# Patient Record
Sex: Male | Born: 1945 | Race: White | Hispanic: No | Smoking: Former smoker
Health system: Southern US, Community
[De-identification: ages and names within clinical notes are randomized; demographics above are authoritative.]

## PROBLEM LIST (undated history)

## (undated) DIAGNOSIS — K219 Gastro-esophageal reflux disease without esophagitis: Secondary | ICD-10-CM

## (undated) DIAGNOSIS — I4892 Unspecified atrial flutter: Secondary | ICD-10-CM

## (undated) DIAGNOSIS — I10 Essential (primary) hypertension: Secondary | ICD-10-CM

## (undated) DIAGNOSIS — J449 Chronic obstructive pulmonary disease, unspecified: Secondary | ICD-10-CM

## (undated) HISTORY — PX: ARTERY REPAIR: SHX5117

---

## 2002-05-20 ENCOUNTER — Emergency Department (HOSPITAL_COMMUNITY): Admission: EM | Admit: 2002-05-20 | Discharge: 2002-05-20 | Payer: Self-pay | Admitting: Emergency Medicine

## 2004-05-21 ENCOUNTER — Emergency Department (HOSPITAL_COMMUNITY): Admission: EM | Admit: 2004-05-21 | Discharge: 2004-05-21 | Payer: Self-pay | Admitting: *Deleted

## 2005-01-20 ENCOUNTER — Emergency Department (HOSPITAL_COMMUNITY): Admission: EM | Admit: 2005-01-20 | Discharge: 2005-01-20 | Payer: Self-pay | Admitting: Emergency Medicine

## 2005-03-29 ENCOUNTER — Emergency Department (HOSPITAL_COMMUNITY): Admission: EM | Admit: 2005-03-29 | Discharge: 2005-03-29 | Payer: Self-pay | Admitting: Emergency Medicine

## 2009-08-23 ENCOUNTER — Inpatient Hospital Stay (HOSPITAL_COMMUNITY): Admission: EM | Admit: 2009-08-23 | Discharge: 2009-08-23 | Payer: Self-pay | Admitting: Emergency Medicine

## 2010-01-01 ENCOUNTER — Emergency Department (HOSPITAL_COMMUNITY): Admission: EM | Admit: 2010-01-01 | Discharge: 2010-01-01 | Payer: Self-pay | Admitting: Emergency Medicine

## 2010-04-28 ENCOUNTER — Emergency Department (HOSPITAL_COMMUNITY): Admission: EM | Admit: 2010-04-28 | Discharge: 2010-04-28 | Payer: Self-pay | Admitting: Emergency Medicine

## 2010-05-27 ENCOUNTER — Emergency Department (HOSPITAL_COMMUNITY): Admission: EM | Admit: 2010-05-27 | Discharge: 2010-05-27 | Payer: Self-pay | Admitting: Emergency Medicine

## 2010-05-28 ENCOUNTER — Emergency Department (HOSPITAL_COMMUNITY): Admission: EM | Admit: 2010-05-28 | Discharge: 2010-05-28 | Payer: Self-pay | Admitting: Emergency Medicine

## 2011-01-09 LAB — URINALYSIS, ROUTINE W REFLEX MICROSCOPIC
Glucose, UA: 100 mg/dL — AB
Ketones, ur: NEGATIVE mg/dL
Specific Gravity, Urine: 1.02 (ref 1.005–1.030)
pH: 7.5 (ref 5.0–8.0)

## 2011-01-09 LAB — URINE MICROSCOPIC-ADD ON

## 2011-01-11 LAB — BASIC METABOLIC PANEL
CO2: 29 mEq/L (ref 19–32)
Glucose, Bld: 117 mg/dL — ABNORMAL HIGH (ref 70–99)
Sodium: 137 mEq/L (ref 135–145)

## 2011-01-11 LAB — CBC
HCT: 40.4 % (ref 39.0–52.0)
Hemoglobin: 13.7 g/dL (ref 13.0–17.0)
MCHC: 33.8 g/dL (ref 30.0–36.0)
WBC: 7.7 10*3/uL (ref 4.0–10.5)

## 2011-01-11 LAB — DIFFERENTIAL
Basophils Absolute: 0.1 10*3/uL (ref 0.0–0.1)
Eosinophils Relative: 4 % (ref 0–5)
Lymphs Abs: 3.1 10*3/uL (ref 0.7–4.0)
Monocytes Absolute: 0.7 10*3/uL (ref 0.1–1.0)
Monocytes Relative: 9 % (ref 3–12)

## 2011-01-18 LAB — DIFFERENTIAL
Basophils Relative: 1 % (ref 0–1)
Lymphs Abs: 3.4 10*3/uL (ref 0.7–4.0)
Monocytes Absolute: 0.6 10*3/uL (ref 0.1–1.0)
Monocytes Relative: 7 % (ref 3–12)
Neutro Abs: 3.4 10*3/uL (ref 1.7–7.7)
Neutrophils Relative %: 45 % (ref 43–77)

## 2011-01-18 LAB — BASIC METABOLIC PANEL
CO2: 30 mEq/L (ref 19–32)
GFR calc Af Amer: >60 mL/min (ref >60)
Potassium: 3.8 mEq/L (ref 3.5–5.1)

## 2011-01-18 LAB — CBC
MCV: 92.6 fL (ref 78.0–100.0)
Platelets: 269 10*3/uL (ref 150–400)
RDW: 12.6 % (ref 11.5–15.5)

## 2011-01-29 ENCOUNTER — Emergency Department (HOSPITAL_COMMUNITY)
Admission: EM | Admit: 2011-01-29 | Discharge: 2011-01-29 | Disposition: A | Discharge status: Home / Self Care | Payer: Self-pay | Attending: Emergency Medicine | Admitting: Emergency Medicine

## 2011-01-29 ENCOUNTER — Emergency Department (HOSPITAL_COMMUNITY): Payer: Self-pay

## 2011-01-29 DIAGNOSIS — J449 Chronic obstructive pulmonary disease, unspecified: Secondary | ICD-10-CM | POA: Insufficient documentation

## 2011-01-29 DIAGNOSIS — R071 Chest pain on breathing: Secondary | ICD-10-CM | POA: Insufficient documentation

## 2011-01-29 DIAGNOSIS — F411 Generalized anxiety disorder: Secondary | ICD-10-CM | POA: Insufficient documentation

## 2011-01-29 DIAGNOSIS — J4489 Other specified chronic obstructive pulmonary disease: Secondary | ICD-10-CM | POA: Insufficient documentation

## 2011-01-29 LAB — CBC
HCT: 39.6 % (ref 39.0–52.0)
Hemoglobin: 13.6 g/dL (ref 13.0–17.0)
MCHC: 34.3 g/dL (ref 30.0–36.0)
MCV: 95.8 fL (ref 78.0–100.0)
Platelets: 208 10*3/uL (ref 150–400)
Platelets: 211 10*3/uL (ref 150–400)
RBC: 4.44 MIL/uL (ref 4.22–5.81)
RDW: 12.7 % (ref 11.5–15.5)
RDW: 13 % (ref 11.5–15.5)

## 2011-01-29 LAB — DIFFERENTIAL
Basophils Absolute: 0 10*3/uL (ref 0.0–0.1)
Basophils Absolute: 0 K/uL (ref 0.0–0.1)
Basophils Relative: 0 % (ref 0–1)
Basophils Relative: 1 % (ref 0–1)
Eosinophils Absolute: 0 K/uL (ref 0.0–0.7)
Eosinophils Relative: 0 % (ref 0–5)
Lymphocytes Relative: 9 % — ABNORMAL LOW (ref 12–46)
Lymphs Abs: 0.6 K/uL — ABNORMAL LOW (ref 0.7–4.0)
Monocytes Absolute: 0.2 K/uL (ref 0.1–1.0)
Monocytes Relative: 3 % (ref 3–12)
Neutro Abs: 5.6 10*3/uL (ref 1.7–7.7)
Neutro Abs: 6 K/uL (ref 1.7–7.7)
Neutrophils Relative %: 76 % (ref 43–77)
Neutrophils Relative %: 88 % — ABNORMAL HIGH (ref 43–77)

## 2011-01-29 LAB — BASIC METABOLIC PANEL
BUN: 13 mg/dL (ref 6–23)
CO2: 29 mEq/L (ref 19–32)
Calcium: 8.9 mg/dL (ref 8.4–10.5)
Creatinine, Ser: 1 mg/dL (ref 0.4–1.5)
GFR calc Af Amer: >60 mL/min (ref >60)
GFR calc non Af Amer: >60 mL/min (ref >60)
GFR calc non Af Amer: >60 mL/min (ref >60)
Glucose, Bld: 126 mg/dL — ABNORMAL HIGH (ref 70–99)
Sodium: 139 mEq/L (ref 135–145)

## 2011-01-29 LAB — EXPECTORATED SPUTUM ASSESSMENT W GRAM STAIN, RFLX TO RESP C

## 2011-01-29 LAB — BLOOD GAS, ARTERIAL
Acid-Base Excess: 5 mmol/L — ABNORMAL HIGH (ref 0.0–2.0)
FIO2: 21 %
O2 Content: 21 L/min
pCO2 arterial: 48.8 mmHg — ABNORMAL HIGH (ref 35.0–45.0)
pH, Arterial: 7.4 (ref 7.350–7.450)

## 2011-01-29 LAB — CULTURE, RESPIRATORY W GRAM STAIN

## 2011-01-29 LAB — LIPID PANEL
HDL: 46 mg/dL (ref >39)
Total CHOL/HDL Ratio: 4 RATIO
Triglycerides: 51 mg/dL (ref <150)
VLDL: 10 mg/dL (ref 0–40)

## 2011-07-14 ENCOUNTER — Encounter: Payer: Self-pay | Admitting: *Deleted

## 2011-07-14 ENCOUNTER — Emergency Department (HOSPITAL_COMMUNITY)
Admission: EM | Admit: 2011-07-14 | Discharge: 2011-07-14 | Disposition: A | Discharge status: Home / Self Care | Payer: Self-pay | Attending: Emergency Medicine | Admitting: Emergency Medicine

## 2011-07-14 ENCOUNTER — Emergency Department (HOSPITAL_COMMUNITY): Payer: Self-pay

## 2011-07-14 DIAGNOSIS — K5900 Constipation, unspecified: Secondary | ICD-10-CM | POA: Insufficient documentation

## 2011-07-14 DIAGNOSIS — J449 Chronic obstructive pulmonary disease, unspecified: Secondary | ICD-10-CM | POA: Insufficient documentation

## 2011-07-14 DIAGNOSIS — J4489 Other specified chronic obstructive pulmonary disease: Secondary | ICD-10-CM | POA: Insufficient documentation

## 2011-07-14 DIAGNOSIS — R109 Unspecified abdominal pain: Secondary | ICD-10-CM

## 2011-07-14 DIAGNOSIS — Z87891 Personal history of nicotine dependence: Secondary | ICD-10-CM | POA: Insufficient documentation

## 2011-07-14 HISTORY — DX: Chronic obstructive pulmonary disease, unspecified: J44.9

## 2011-07-14 LAB — URINALYSIS, ROUTINE W REFLEX MICROSCOPIC
Ketones, ur: NEGATIVE mg/dL
Leukocytes, UA: NEGATIVE
Nitrite: NEGATIVE
Protein, ur: NEGATIVE mg/dL
Urobilinogen, UA: 0.2 mg/dL (ref 0.0–1.0)

## 2011-07-14 LAB — URINE MICROSCOPIC-ADD ON

## 2011-07-14 MED ORDER — BISACODYL 5 MG PO TBEC: 5.0000 mg | DELAYED_RELEASE_TABLET | Freq: Two times a day (BID) | ORAL | Status: AC

## 2011-07-14 MED ORDER — MAGNESIUM CITRATE PO SOLN: 296.0000 mL | Freq: Once | ORAL | Status: AC

## 2011-07-14 NOTE — ED Provider Notes (Addendum)
History     CSN: [id number] Arrival date & time: 07/14/2011  7:27 PM   Chief Complaint  Patient presents with  . Abdominal Pain     (Include location/radiation/quality/duration/timing/severity/associated sxs/prior treatment) Patient is a 65 y.o. male presenting with abdominal pain. The history is provided by the patient.  Abdominal Pain The primary symptoms of the illness include abdominal pain. The primary symptoms of the illness do not include fever, shortness of breath, nausea, vomiting, diarrhea or dysuria.  Additional symptoms associated with the illness include constipation. Symptoms associated with the illness do not include diaphoresis, hematuria or back pain.   The patient is a 65 year old male with a history of COPD.  He was lying in bed.  He had sudden onset of left-sided abdominal pain that extended to the right side.  It lasted for about 15 minutes.  It and is resolved now.  He denies nausea, vomiting, fevers, chills, cough, shortness of breath, lightheadedness, diarrhea, or hematuria.  He has no history of similar symptoms.  He has had constipation over the last few days.  He denies alcohol use.  He denies prior abdominal surgery.    Past Medical History  Diagnosis Date  . COPD (chronic obstructive pulmonary disease)      History reviewed. No pertinent past surgical history.  No family history on file.  History  Substance Use Topics  . Smoking status: Former Research scientist (life sciences)  . Smokeless tobacco: Never Used  . Alcohol Use: No      Review of Systems  Constitutional: Negative for fever and diaphoresis.  HENT: Negative for neck pain.   Eyes: Negative for redness.  Respiratory: Negative for cough, chest tightness and shortness of breath.   Cardiovascular: Negative for chest pain and palpitations.  Gastrointestinal: Positive for abdominal pain and constipation. Negative for nausea, vomiting and diarrhea.  Genitourinary: Negative for dysuria and hematuria.    Musculoskeletal: Negative for back pain.  Neurological: Negative for light-headedness and headaches.  Psychiatric/Behavioral: Negative for confusion.    Allergies  Penicillins  Home Medications  No current outpatient prescriptions on file.  Physical Exam    BP 136/93  Pulse 86  Temp(Src) 97.9 F (36.6 C) (Oral)  Resp 24  Ht 5\' 11"  (1.803 m)  Wt 235 lb (106.595 kg)  BMI 32.78 kg/m2  SpO2 95%  Physical Exam  Constitutional: He is oriented to person, place, and time. He appears well-developed and well-nourished. No distress.       Obese  HENT:  Head: Normocephalic and atraumatic.  Eyes: EOM are normal. Pupils are equal, round, and reactive to light.  Neck: Normal range of motion. Neck supple.  Cardiovascular: Normal rate, regular rhythm and normal heart sounds.   No murmur heard. Pulmonary/Chest: Effort normal and breath sounds normal. No respiratory distress. He has no wheezes. He has no rales.  Abdominal: Soft. Bowel sounds are normal. He exhibits no distension and no mass. There is no tenderness. There is no rebound and no guarding.  Musculoskeletal: Normal range of motion. He exhibits no edema and no tenderness.  Neurological: He is alert and oriented to person, place, and time. No cranial nerve deficit.  Skin: Skin is warm and dry. He is not diaphoretic.  Psychiatric: He has a normal mood and affect. His behavior is normal.    ED Course  Procedures  We will perform a urinalysis, and abdominal x-ray to evaluate for hematuria or urinary tract infection in this elderly male, with transient abdominal pain, and a history of constipation. No  results found.   No diagnosis found.   MDM Abdominal pain       Elmer Picker, MD 07/14/11 2105  Elmer Picker, MD 07/14/11 2107

## 2011-07-14 NOTE — ED Notes (Signed)
Pt reports lower abdominal pain that stated an hour ago. Radiates from the left side to the right. Pt denies any vomiting or diarrhea.

## 2012-05-24 ENCOUNTER — Encounter (HOSPITAL_COMMUNITY): Payer: Self-pay | Admitting: Emergency Medicine

## 2012-05-24 ENCOUNTER — Emergency Department (HOSPITAL_COMMUNITY): Payer: Self-pay

## 2012-05-24 ENCOUNTER — Emergency Department (HOSPITAL_COMMUNITY)
Admission: EM | Admit: 2012-05-24 | Discharge: 2012-05-24 | Disposition: A | Discharge status: Home / Self Care | Payer: Self-pay | Attending: Emergency Medicine | Admitting: Emergency Medicine

## 2012-05-24 DIAGNOSIS — R109 Unspecified abdominal pain: Secondary | ICD-10-CM | POA: Insufficient documentation

## 2012-05-24 DIAGNOSIS — J449 Chronic obstructive pulmonary disease, unspecified: Secondary | ICD-10-CM | POA: Insufficient documentation

## 2012-05-24 DIAGNOSIS — J4489 Other specified chronic obstructive pulmonary disease: Secondary | ICD-10-CM | POA: Insufficient documentation

## 2012-05-24 DIAGNOSIS — K5900 Constipation, unspecified: Secondary | ICD-10-CM | POA: Insufficient documentation

## 2012-05-24 MED ORDER — IPRATROPIUM BROMIDE 0.02 % IN SOLN
0.5000 mg | Freq: Once | RESPIRATORY_TRACT | Status: AC
  Administered 2012-05-24: 0.5 mg via RESPIRATORY_TRACT
  Filled 2012-05-24: qty 2.5

## 2012-05-24 MED ORDER — ALBUTEROL SULFATE HFA 108 (90 BASE) MCG/ACT IN AERS: 1.0000 | INHALATION_SPRAY | Freq: Four times a day (QID) | RESPIRATORY_TRACT | Status: DC | PRN

## 2012-05-24 MED ORDER — ALBUTEROL SULFATE (5 MG/ML) 0.5% IN NEBU
2.5000 mg | INHALATION_SOLUTION | Freq: Once | RESPIRATORY_TRACT | Status: AC
  Administered 2012-05-24: 2.5 mg via RESPIRATORY_TRACT
  Filled 2012-05-24: qty 0.5

## 2012-05-24 MED ORDER — AZITHROMYCIN 250 MG PO TABS: 250.0000 mg | ORAL_TABLET | Freq: Every day | ORAL | Status: AC

## 2012-05-24 NOTE — ED Notes (Signed)
Patient complaining of abdominal pain starting approximately 1 hour ago. States "I have been constipated and took some milk of magnesium and only went to the bathroom one time."

## 2012-05-24 NOTE — ED Provider Notes (Signed)
History     CSN: Darren Burke  Arrival date & time 05/24/12  0155   First MD Initiated Contact with Patient 05/24/12 0209      Chief Complaint  Patient presents with  . Abdominal Pain  . Constipation    (Consider location/radiation/quality/duration/timing/severity/associated sxs/prior treatment) HPI Darren Burke is a 66 y.o. male who presents to the Emergency Department complaining of abdominal pain that began an hour ago in the setting of recent constipation and taking a laxative. Patient state she has not had a good Bm for a week. Took MOM yesterday and had a small BM. Today had a large BM. Now having gas pains with passage of some flatus. Second c/o of cough productive of foul tasting sputum x 2 days. Denies fever,chills, nausea, vomiting, shortness of breath or chest pain.  Past Medical History  Diagnosis Date  . COPD (chronic obstructive pulmonary disease)     History reviewed. No pertinent past surgical history.  History reviewed. No pertinent family history.  History  Substance Use Topics  . Smoking status: Former Research scientist (life sciences)  . Smokeless tobacco: Never Used  . Alcohol Use: No      Review of Systems  Constitutional: Negative for fever.       10 Systems reviewed and are negative for acute change except as noted in the HPI.  HENT: Negative for congestion.   Eyes: Negative for discharge and redness.  Respiratory: Positive for cough and wheezing. Negative for shortness of breath.   Cardiovascular: Negative for chest pain.  Gastrointestinal: Positive for abdominal pain and constipation. Negative for vomiting.  Musculoskeletal: Negative for back pain.  Skin: Negative for rash.  Neurological: Negative for syncope, numbness and headaches.  Psychiatric/Behavioral:       No behavior change.    Allergies  Penicillins  Home Medications  No current outpatient prescriptions on file.  BP 133/80  Pulse 105  Temp 98.3 F (36.8 C) (Oral)  Resp 20  Ht 5\' 11"  (1.803 m)   Wt 228 lb (103.42 kg)  BMI 31.80 kg/m2  SpO2 95%  Physical Exam  Nursing note and vitals reviewed. Constitutional: He is oriented to person, place, and time. He appears well-developed and well-nourished.       Awake, alert, nontoxic appearance.  HENT:  Head: Atraumatic.  Right Ear: External ear normal.  Left Ear: External ear normal.  Mouth/Throat: Oropharynx is clear and moist.       edentulous  Eyes: Conjunctivae and EOM are normal. Pupils are equal, round, and reactive to light. Right eye exhibits no discharge. Left eye exhibits no discharge.  Neck: Neck supple.  Cardiovascular: Normal heart sounds.   Pulmonary/Chest: Effort normal. He has wheezes. He exhibits no tenderness.  Abdominal: Soft. There is tenderness. There is no rebound.       Mild left lower quadrant discomfort to palpation. Bowel sounds hyperactive.   Musculoskeletal: Normal range of motion. He exhibits no tenderness.       Baseline ROM, no obvious new focal weakness.  Neurological: He is alert and oriented to person, place, and time. He has normal reflexes.       Mental status and motor strength appears baseline for patient and situation.  Skin: Skin is warm and dry. No rash noted.  Psychiatric: He has a normal mood and affect.    ED Course  Procedures (including critical care time)  Dg Abd Acute W/chest  05/24/2012  *RADIOLOGY REPORT*  Clinical Data: Abdominal pain  ACUTE ABDOMEN SERIES (ABDOMEN 2 VIEW &  CHEST 1 VIEW)  Comparison: 07/14/2011  Findings: Hyperexpanded lungs with apical emphysematous changes and chronic increased interstitial markings. Prominent pulmonary arterial branches are similar to prior.  Right mid lung opacity is unchanged from 2011.  No free intraperitoneal air. The bowel gas pattern is non- obstructive. Organ outlines are normal where seen. No acute or aggressive osseous abnormality identified.  IMPRESSION: Nonobstructive bowel gas pattern.  Original Report Authenticated By: Suanne Marker, M.D.      MDM  Patient with constipation and with a cough. Acute abdominal series with chest shows a non obstructive gas pattern with a lot of stool. Chest xray does not show an acute process, patient with emphysema. Given nebulizer treatment with improvement in wheezing. Dx testing d/w pt. Questions answered.  Verb understanding. Pt stable in ED with no significant deterioration in condition.The patient appears reasonably screened and/or stabilized for discharge and I doubt any other medical condition or other [facility] requiring further screening, evaluation, or treatment in the ED at this time prior to discharge.  MDM Reviewed: nursing note and vitals Interpretation: x-ray            Gypsy Balsam. Olin Hauser, MD 05/24/12 [phone]

## 2012-05-24 NOTE — ED Notes (Signed)
Patient O2 saturation - 89% on room air. Patient had expiratory wheezing and seemed to be short of breath at triage. Placed patient on 2L O2 via nasal cannula and O2 saturation increased to 95%.

## 2013-10-05 ENCOUNTER — Emergency Department (HOSPITAL_COMMUNITY): Payer: Medicare Other

## 2013-10-05 ENCOUNTER — Inpatient Hospital Stay (HOSPITAL_COMMUNITY)
Admission: EM | Admit: 2013-10-05 | Discharge: 2013-10-06 | DRG: 190 | Disposition: A | Discharge status: Home / Self Care | Payer: Medicare Other | Attending: Internal Medicine | Admitting: Internal Medicine

## 2013-10-05 ENCOUNTER — Encounter (HOSPITAL_COMMUNITY): Payer: Self-pay | Admitting: Emergency Medicine

## 2013-10-05 DIAGNOSIS — J438 Other emphysema: Secondary | ICD-10-CM | POA: Diagnosis not present

## 2013-10-05 DIAGNOSIS — I2510 Atherosclerotic heart disease of native coronary artery without angina pectoris: Secondary | ICD-10-CM | POA: Diagnosis not present

## 2013-10-05 DIAGNOSIS — J189 Pneumonia, unspecified organism: Secondary | ICD-10-CM | POA: Diagnosis present

## 2013-10-05 DIAGNOSIS — R739 Hyperglycemia, unspecified: Secondary | ICD-10-CM | POA: Diagnosis present

## 2013-10-05 DIAGNOSIS — R7309 Other abnormal glucose: Secondary | ICD-10-CM | POA: Diagnosis present

## 2013-10-05 DIAGNOSIS — J441 Chronic obstructive pulmonary disease with (acute) exacerbation: Secondary | ICD-10-CM | POA: Diagnosis not present

## 2013-10-05 DIAGNOSIS — Z87891 Personal history of nicotine dependence: Secondary | ICD-10-CM | POA: Diagnosis not present

## 2013-10-05 DIAGNOSIS — R0602 Shortness of breath: Secondary | ICD-10-CM | POA: Diagnosis present

## 2013-10-05 DIAGNOSIS — J449 Chronic obstructive pulmonary disease, unspecified: Secondary | ICD-10-CM | POA: Diagnosis not present

## 2013-10-05 DIAGNOSIS — J9600 Acute respiratory failure, unspecified whether with hypoxia or hypercapnia: Secondary | ICD-10-CM | POA: Diagnosis present

## 2013-10-05 DIAGNOSIS — I498 Other specified cardiac arrhythmias: Secondary | ICD-10-CM | POA: Diagnosis present

## 2013-10-05 DIAGNOSIS — J439 Emphysema, unspecified: Secondary | ICD-10-CM | POA: Diagnosis present

## 2013-10-05 DIAGNOSIS — R0902 Hypoxemia: Secondary | ICD-10-CM

## 2013-10-05 DIAGNOSIS — T380X5A Adverse effect of glucocorticoids and synthetic analogues, initial encounter: Secondary | ICD-10-CM | POA: Diagnosis present

## 2013-10-05 DIAGNOSIS — R000 Tachycardia, unspecified: Secondary | ICD-10-CM | POA: Diagnosis present

## 2013-10-05 DIAGNOSIS — J9601 Acute respiratory failure with hypoxia: Secondary | ICD-10-CM | POA: Diagnosis present

## 2013-10-05 LAB — TROPONIN I: Troponin I: <0.3 ng/mL (ref <0.30)

## 2013-10-05 LAB — COMPREHENSIVE METABOLIC PANEL
Albumin: 3.5 g/dL (ref 3.5–5.2)
BUN: 14 mg/dL (ref 6–23)
Chloride: 94 mEq/L — ABNORMAL LOW (ref 96–112)
Creatinine, Ser: 0.98 mg/dL (ref 0.50–1.35)
GFR calc non Af Amer: 84 mL/min — ABNORMAL LOW (ref >90)
Total Bilirubin: 0.4 mg/dL (ref 0.3–1.2)

## 2013-10-05 LAB — CBC WITH DIFFERENTIAL/PLATELET
Basophils Relative: 0 % (ref 0–1)
Eosinophils Relative: 0 % (ref 0–5)
HCT: 45 % (ref 39.0–52.0)
Hemoglobin: 14.9 g/dL (ref 13.0–17.0)
MCH: 31.5 pg (ref 26.0–34.0)
MCHC: 33.1 g/dL (ref 30.0–36.0)
MCV: 95.1 fL (ref 78.0–100.0)
Monocytes Absolute: 0.8 10*3/uL (ref 0.1–1.0)
Monocytes Relative: 8 % (ref 3–12)
Neutro Abs: 6.6 10*3/uL (ref 1.7–7.7)

## 2013-10-05 LAB — GLUCOSE, CAPILLARY: Glucose-Capillary: 103 mg/dL — ABNORMAL HIGH (ref 70–99)

## 2013-10-05 LAB — PRO B NATRIURETIC PEPTIDE: Pro B Natriuretic peptide (BNP): 24 pg/mL (ref 0–125)

## 2013-10-05 MED ORDER — INSULIN ASPART 100 UNIT/ML ~~LOC~~ SOLN: 0.0000 [IU] | Freq: Three times a day (TID) | SUBCUTANEOUS | Status: DC

## 2013-10-05 MED ORDER — SODIUM CHLORIDE 0.9 % IV SOLN: 250.0000 mL | INTRAVENOUS | Status: DC | PRN

## 2013-10-05 MED ORDER — ALBUTEROL SULFATE (5 MG/ML) 0.5% IN NEBU
5.0000 mg | INHALATION_SOLUTION | Freq: Once | RESPIRATORY_TRACT | Status: AC
  Administered 2013-10-05: 5 mg via RESPIRATORY_TRACT
  Filled 2013-10-05: qty 1

## 2013-10-05 MED ORDER — IPRATROPIUM BROMIDE 0.02 % IN SOLN
0.5000 mg | Freq: Once | RESPIRATORY_TRACT | Status: AC
  Administered 2013-10-05: 0.5 mg via RESPIRATORY_TRACT
  Filled 2013-10-05: qty 2.5

## 2013-10-05 MED ORDER — INSULIN ASPART 100 UNIT/ML ~~LOC~~ SOLN: 0.0000 [IU] | Freq: Every day | SUBCUTANEOUS | Status: DC

## 2013-10-05 MED ORDER — ACETAMINOPHEN 325 MG PO TABS: 650.0000 mg | ORAL_TABLET | Freq: Four times a day (QID) | ORAL | Status: DC | PRN

## 2013-10-05 MED ORDER — IOHEXOL 350 MG/ML SOLN
100.0000 mL | Freq: Once | INTRAVENOUS | Status: AC | PRN
  Administered 2013-10-05: 100 mL via INTRAVENOUS

## 2013-10-05 MED ORDER — INSULIN GLARGINE 100 UNIT/ML ~~LOC~~ SOLN
7.0000 [IU] | Freq: Every day | SUBCUTANEOUS | Status: DC
  Administered 2013-10-05: 7 [IU] via SUBCUTANEOUS
  Filled 2013-10-05 (×4): qty 0.07

## 2013-10-05 MED ORDER — LEVOFLOXACIN IN D5W 750 MG/150ML IV SOLN
750.0000 mg | Freq: Once | INTRAVENOUS | Status: AC
  Administered 2013-10-05: 750 mg via INTRAVENOUS
  Filled 2013-10-05: qty 150

## 2013-10-05 MED ORDER — SODIUM CHLORIDE 0.9 % IJ SOLN: 3.0000 mL | INTRAMUSCULAR | Status: DC | PRN

## 2013-10-05 MED ORDER — METHYLPREDNISOLONE SODIUM SUCC 125 MG IJ SOLR
INTRAMUSCULAR | Status: AC
  Filled 2013-10-05: qty 2

## 2013-10-05 MED ORDER — IPRATROPIUM BROMIDE 0.02 % IN SOLN: 0.5000 mg | Freq: Four times a day (QID) | RESPIRATORY_TRACT | Status: DC

## 2013-10-05 MED ORDER — SODIUM CHLORIDE 0.9 % IJ SOLN
3.0000 mL | Freq: Two times a day (BID) | INTRAMUSCULAR | Status: DC
  Administered 2013-10-05 (×2): 3 mL via INTRAVENOUS

## 2013-10-05 MED ORDER — PREDNISONE 20 MG PO TABS
60.0000 mg | ORAL_TABLET | Freq: Two times a day (BID) | ORAL | Status: DC
  Administered 2013-10-05 – 2013-10-06 (×2): 60 mg via ORAL
  Filled 2013-10-05 (×2): qty 3

## 2013-10-05 MED ORDER — BIOTENE DRY MOUTH MT LIQD
15.0000 mL | Freq: Two times a day (BID) | OROMUCOSAL | Status: DC
  Administered 2013-10-05 – 2013-10-06 (×3): 15 mL via OROMUCOSAL

## 2013-10-05 MED ORDER — IBUPROFEN 800 MG PO TABS
400.0000 mg | ORAL_TABLET | Freq: Four times a day (QID) | ORAL | Status: DC | PRN
  Administered 2013-10-05: 400 mg via ORAL
  Filled 2013-10-05: qty 1

## 2013-10-05 MED ORDER — LEVOFLOXACIN IN D5W 750 MG/150ML IV SOLN
750.0000 mg | INTRAVENOUS | Status: DC
  Administered 2013-10-06: 750 mg via INTRAVENOUS
  Filled 2013-10-05 (×4): qty 150

## 2013-10-05 MED ORDER — POTASSIUM CHLORIDE CRYS ER 20 MEQ PO TBCR
20.0000 meq | EXTENDED_RELEASE_TABLET | Freq: Two times a day (BID) | ORAL | Status: AC
  Administered 2013-10-05 (×2): 20 meq via ORAL
  Filled 2013-10-05 (×2): qty 1

## 2013-10-05 MED ORDER — METHYLPREDNISOLONE SODIUM SUCC 125 MG IJ SOLR
125.0000 mg | Freq: Once | INTRAMUSCULAR | Status: AC
  Administered 2013-10-05: 125 mg via INTRAVENOUS

## 2013-10-05 MED ORDER — IPRATROPIUM BROMIDE 0.02 % IN SOLN
0.5000 mg | Freq: Four times a day (QID) | RESPIRATORY_TRACT | Status: DC
  Administered 2013-10-05 – 2013-10-06 (×3): 0.5 mg via RESPIRATORY_TRACT
  Filled 2013-10-05 (×2): qty 2.5

## 2013-10-05 MED ORDER — SODIUM CHLORIDE 0.9 % IV BOLUS (SEPSIS)
1000.0000 mL | Freq: Once | INTRAVENOUS | Status: AC
  Administered 2013-10-05: 1000 mL via INTRAVENOUS

## 2013-10-05 MED ORDER — IPRATROPIUM BROMIDE 0.02 % IN SOLN
0.5000 mg | RESPIRATORY_TRACT | Status: DC
  Administered 2013-10-05: 0.5 mg via RESPIRATORY_TRACT
  Filled 2013-10-05: qty 2.5

## 2013-10-05 MED ORDER — LEVALBUTEROL HCL 1.25 MG/0.5ML IN NEBU
1.2500 mg | INHALATION_SOLUTION | Freq: Four times a day (QID) | RESPIRATORY_TRACT | Status: DC
  Administered 2013-10-05 – 2013-10-06 (×4): 1.25 mg via RESPIRATORY_TRACT
  Filled 2013-10-05 (×4): qty 0.5

## 2013-10-05 NOTE — ED Notes (Signed)
Pt states he has been sob and with productive cough and congestion for several days, denies chest pain

## 2013-10-05 NOTE — ED Provider Notes (Signed)
CSN: [id number]     Arrival date & time 10/05/13  0204 History   None    Chief Complaint  Patient presents with  . Shortness of Breath  . Cough   (Consider location/radiation/quality/duration/timing/severity/associated sxs/prior Treatment) HPI Comments: Patient is a 67 year old male with past medical history of COPD. He presents today with complaints of a five-day history of chest congestion, shortness of breath, and productive cough. He denies any chest pain and denies any prior cardiac history.  Patient is a 67 y.o. male presenting with shortness of breath. The history is provided by the patient.  Shortness of Breath Severity:  Moderate Onset quality:  Gradual Duration:  5 days Timing:  Constant Progression:  Worsening Chronicity:  New Context: activity and URI   Relieved by:  Nothing Worsened by:  Nothing tried Ineffective treatments:  None tried   Past Medical History  Diagnosis Date  . COPD (chronic obstructive pulmonary disease)    History reviewed. No pertinent past surgical history. No family history on file. History  Substance Use Topics  . Smoking status: Former Research scientist (life sciences)  . Smokeless tobacco: Never Used  . Alcohol Use: No    Review of Systems  Respiratory: Positive for shortness of breath.   All other systems reviewed and are negative.    Allergies  Penicillins  Home Medications   Current Outpatient Rx  Name  Route  Sig  Dispense  Refill  . EXPIRED: albuterol (PROVENTIL HFA;VENTOLIN HFA) 108 (90 BASE) MCG/ACT inhaler   Inhalation   Inhale 1-2 puffs into the lungs every 6 (six) hours as needed for wheezing.   1 Inhaler   0    BP 158/104  Pulse 132  Temp(Src) 100.3 F (37.9 C) (Oral)  Resp 24  Ht 5\' 11"  (1.803 m)  Wt 230 lb (104.327 kg)  BMI 32.09 kg/m2  SpO2 89% Physical Exam  Nursing note and vitals reviewed. Constitutional: He is oriented to person, place, and time. He appears well-developed and well-nourished.  HENT:  Head:  Normocephalic.  Mouth/Throat: Oropharynx is clear and moist.  Neck: Normal range of motion. Neck supple.  Cardiovascular: Normal rate, regular rhythm and normal heart sounds.   No murmur heard. Pulmonary/Chest: Effort normal. No respiratory distress. He has wheezes.  There are scattered wheezes bilaterally.  Abdominal: Soft. Bowel sounds are normal. He exhibits no distension. There is no tenderness.  Musculoskeletal: Normal range of motion. He exhibits no edema.  Neurological: He is alert and oriented to person, place, and time.  Skin: Skin is warm and dry. He is not diaphoretic.    ED Course  Procedures (including critical care time) Labs Review Labs Reviewed  CBC WITH DIFFERENTIAL  COMPREHENSIVE METABOLIC PANEL  TROPONIN I  PRO B NATRIURETIC PEPTIDE   Imaging Review No results found.  EKG Interpretation    Date/Time:  Thursday October 05 2013 02:14:22 EST Ventricular Rate:  127 PR Interval:  140 QRS Duration: 90 QT Interval:  310 QTC Calculation: 450 R Axis:   -98 Text Interpretation:  Sinus tachycardia Right superior axis deviation Pulmonary disease pattern Right ventricular hypertrophy Nonspecific ST abnormality Abnormal ECG When compared with ECG of 28-Apr-2010 03:58, Vent. rate has increased BY  44 BPM QRS axis Shifted left Confirmed by DELOS  MD, Teresea Donley ([id number]) on 10/05/2013 2:33:33 AM            MDM  No diagnosis found. Workup reveals no evidence of a cardiac etiology. He remains hypoxic and tachycardic despite treatment. For this reason a PE  study was performed which was negative for pulmonary embolism. However there was evidence for possible pneumonia. This more fits the clinical picture. He will be given ceftriaxone and Zithromax and will be admitted for further treatment. I've spoken with Dr. Shanon Brow who agrees to admit.    Veryl Speak, MD 10/05/13 [phone]

## 2013-10-05 NOTE — Care Management Note (Signed)
    Page 1 of 1   10/06/2013     10:44:34 AM   CARE MANAGEMENT NOTE 10/06/2013  Patient:  Darren Burke, Darren Burke   Account Number:  [id number]  Date Initiated:  10/05/2013  Documentation initiated by:  Claretha Cooper  Subjective/Objective Assessment:   Pt lives at home alone. Plans on returning home at DC. Requested help at home but not skilled. Private Duty care list given to pt. Pt also requests assistance with a PCP. States he prefers Librarian, academic.     Action/Plan:   Call placed to [facility] to schedule appt. Left Message   Anticipated DC Date:  10/07/2013   Anticipated DC Plan:  [place]  CM consult      Choice offered to / List presented to:             Status of service:  Completed, signed off Medicare Important Message given?  NA - LOS <3 / Initial given by admissions (If response is "NO", the following Medicare IM given date fields will be blank) Date Medicare IM given:   Date Additional Medicare IM given:    Discharge Disposition:  HOME/SELF CARE  Per UR Regulation:    If discussed at Long Length of Stay Meetings, dates discussed:    Comments:  10/06/13 Claretha Cooper RN BSN CM Appt set up with Dr. Anastasio Champion for Monday at 10:20  10/05/13 Fiora Weill RN BSN CM

## 2013-10-05 NOTE — Progress Notes (Signed)
Utilization Review Complete  

## 2013-10-05 NOTE — Progress Notes (Addendum)
ANTIBIOTIC CONSULT NOTE - INITIAL  Pharmacy Consult for abx renal dose adjustments  Allergies  Allergen Reactions  . Penicillins Hives and Itching    Patient Measurements: Height: 5\' 11"  (180.3 cm) Weight: 230 lb (104.327 kg) IBW/kg (Calculated) : 75.3  Vital Signs: Temp: 98.1 F (36.7 C) (12/11 [id number]) Temp src: Oral (12/11 [id number]) BP: 107/63 mmHg (12/11 [id number]) Pulse Rate: 101 (12/11 [id number]) Intake/Output from previous day:   Intake/Output from this shift:    Labs:  Recent Labs  10/05/13 0222  WBC 9.7  HGB 14.9  PLT 183  CREATININE 0.98   Estimated Creatinine Clearance: 92.4 ml/min (by C-G formula based on Cr of 0.98). No results found for this basename: VANCOTROUGH, Corlis Leak, VANCORANDOM, [place], GENTPEAK, GENTRANDOM, TOBRATROUGH, TOBRAPEAK, TOBRARND, AMIKACINPEAK, AMIKACINTROU, AMIKACIN,  in the last 72 hours   Microbiology: Recent Results (from the past 720 hour(s))  CULTURE, BLOOD (ROUTINE X 2)     Status: None   Collection Time    10/05/13  6:52 AM      Result Value Range Status   Specimen Description Blood RIGHT HAND   Final   Special Requests BOTTLES DRAWN AEROBIC AND ANAEROBIC [facility] EACH   Final   Culture PENDING   Incomplete   Report Status PENDING   Incomplete  CULTURE, BLOOD (ROUTINE X 2)     Status: None   Collection Time    10/05/13  6:52 AM      Result Value Range Status   Specimen Description Blood RIGHT ARM   Final   Special Requests BOTTLES DRAWN AEROBIC AND ANAEROBIC 8 CC EACH   Final   Culture PENDING   Incomplete   Report Status PENDING   Incomplete    Medical History: Past Medical History  Diagnosis Date  . COPD (chronic obstructive pulmonary disease)     Medications:  Scheduled:  . antiseptic oral rinse  15 mL Mouth Rinse BID  . levalbuterol  1.25 mg Nebulization Q6H  . [START ON 10/06/2013] levofloxacin (LEVAQUIN) IV  750 mg Intravenous Q24H  . sodium chloride  3 mL Intravenous Q12H   Assessment: 67 yo M admitted with CAP.   He has a hx of COPD.   He is afebrile with normal WBC.  Lactic acid is also normal.   Renal function is at patient's baseline.  Normalized CrCl ~75-80 ml/min.   Given excellent renal function, no need for renal dose adjustments at this time.  Do not anticipate renal function will decline at this point.   Levaquin 12/11>>  Plan:  Continue Levaquin 750mg  IV q24h. Change to po as appropriate. Duration of therapy per MD- recommend 7 days per CAP guidelines. Pharmacy to sign off.  Please re-consult as needed.   Biagio Borg 10/05/2013,7:40 AM

## 2013-10-05 NOTE — Progress Notes (Signed)
The patient is a 67 year old man with a history of COPD, who was admitted by Dr. Shanon Brow for COPD exacerbation. He was briefly seen and examined. His vital signs and laboratory studies were reviewed. We'll continue management as ordered, but will add a prednisone taper and sliding scale NovoLog for mild steroid-induced hyperglycemia. He received 125 mg of Solu-Medrol in the emergency department.

## 2013-10-05 NOTE — H&P (Signed)
Chief Complaint:  Sob, cough  HPI: 67 yo male with 5 days of worsening sob and cough with chills and subjective fever for pst 24 hours.  No n/v/d.  No nebs at home.  Feels much better with nebs and oxygen in ED.  Also received solumedrol.  No sick contacts.  nonprod cough.  No le edema or swelling.  No cp.  Review of Systems:  Positive and negative as per HPI otherwise all other systems are negative  Past Medical History: Past Medical History  Diagnosis Date  . COPD (chronic obstructive pulmonary disease)    History reviewed. No pertinent past surgical history.  Medications: Prior to Admission medications   Medication Sig Start Date End Date Taking? Authorizing Provider  albuterol (PROVENTIL HFA;VENTOLIN HFA) 108 (90 BASE) MCG/ACT inhaler Inhale 1-2 puffs into the lungs every 6 (six) hours as needed for wheezing. 05/24/12 05/24/13  Gypsy Balsam. Olin Hauser, MD    Allergies:   Allergies  Allergen Reactions  . Penicillins Hives and Itching    Social History:  reports that he has quit smoking. He has never used smokeless tobacco. He reports that he does not drink alcohol or use illicit drugs.  Family History: none  Physical Exam: Filed Vitals:   10/05/13 0215 10/05/13 0219 10/05/13 0240 10/05/13 0243  BP: 158/104     Pulse: 132     Temp: 98.9 F (37.2 C) 100.3 F (37.9 C)    TempSrc: Oral     Resp: 24     Height: 5\' 11"  (1.803 m)     Weight: 104.327 kg (230 lb)     SpO2: 89%  96% 96%   General appearance: alert, cooperative and no distress Head: Normocephalic, without obvious abnormality, atraumatic Eyes: negative Nose: Nares normal. Septum midline. Mucosa normal. No drainage or sinus tenderness. Neck: no JVD and supple, symmetrical, trachea midline Lungs: clear to auscultation bilaterally Heart: regular rate and rhythm, S1, S2 normal, no murmur, click, rub or gallop Abdomen: soft, non-tender; bowel sounds normal; no masses,  no organomegaly Extremities: extremities  normal, atraumatic, no cyanosis or edema Pulses: 2+ and symmetric Skin: Skin color, texture, turgor normal. No rashes or lesions Neurologic: Grossly normal   Labs on Admission:   Recent Labs  10/05/13 0222  NA 135  K 3.6  CL 94*  CO2 28  GLUCOSE 138*  BUN 14  CREATININE 0.98  CALCIUM 8.9    Recent Labs  10/05/13 0222  AST 21  ALT 28  ALKPHOS 54  BILITOT 0.4  PROT 8.4*  ALBUMIN 3.5    Recent Labs  10/05/13 0222  WBC 9.7  NEUTROABS 6.6  HGB 14.9  HCT 45.0  MCV 95.1  PLT 183    Recent Labs  10/05/13 0222  TROPONINI <0.30   Radiological Exams on Admission: Dg Chest Portable 1 View  10/05/2013   CLINICAL DATA:  Shortness of breath and cough for 1 week. History of smoking.  EXAM: PORTABLE CHEST - 1 VIEW  COMPARISON:  Chest radiograph performed 01/29/2011  FINDINGS: The lungs are hyperexpanded, with flattening of the hemidiaphragms, compatible with COPD. Vascular congestion is noted. Bibasilar airspace opacities could reflect minimal interstitial edema. No pleural effusion or pneumothorax is seen.  The cardiomediastinal silhouette is within normal limits. No acute osseous abnormalities are seen.  IMPRESSION: 1. Vascular congestion noted. Bibasilar airspace opacities could reflect minimal interstitial edema. 2. Findings of COPD.   Electronically Signed   By: Garald Balding M.D.   On: 10/05/2013 03:07  Assessment/Plan  67 yo male with acute hypoxia likely due to CAP and mild copde  Principal Problem:   Acute respiratory failure with hypoxia- clinically with pna.  althought cxr is neg.  cta is pending to r/o PE due to significant sinus tachycardia in 130s which is down to low 100 now.  Place on pna pathway, levaquin.  Pt lungs are clear now, will hold off on any steroids, tx with oxygen, nebs prn and abx for now.    Active Problems:   COPD (chronic obstructive pulmonary disease)   COPD exacerbation   PNA (pneumonia)   Sinus tachycardia   SOB (shortness of  breath)    DAVID,RACHAL A 10/05/2013, 3:43 AM

## 2013-10-06 DIAGNOSIS — J189 Pneumonia, unspecified organism: Secondary | ICD-10-CM | POA: Diagnosis not present

## 2013-10-06 DIAGNOSIS — J449 Chronic obstructive pulmonary disease, unspecified: Secondary | ICD-10-CM | POA: Diagnosis not present

## 2013-10-06 DIAGNOSIS — J441 Chronic obstructive pulmonary disease with (acute) exacerbation: Secondary | ICD-10-CM | POA: Diagnosis not present

## 2013-10-06 DIAGNOSIS — J9600 Acute respiratory failure, unspecified whether with hypoxia or hypercapnia: Secondary | ICD-10-CM | POA: Diagnosis not present

## 2013-10-06 LAB — GLUCOSE, CAPILLARY: Glucose-Capillary: 105 mg/dL — ABNORMAL HIGH (ref 70–99)

## 2013-10-06 LAB — CBC
MCH: 30.5 pg (ref 26.0–34.0)
Platelets: 167 10*3/uL (ref 150–400)
RBC: 4.29 MIL/uL (ref 4.22–5.81)
RDW: 12.6 % (ref 11.5–15.5)
WBC: 12.6 10*3/uL — ABNORMAL HIGH (ref 4.0–10.5)

## 2013-10-06 LAB — LEGIONELLA ANTIGEN, URINE: Legionella Antigen, Urine: NEGATIVE

## 2013-10-06 LAB — BASIC METABOLIC PANEL
CO2: 27 mEq/L (ref 19–32)
Calcium: 8.6 mg/dL (ref 8.4–10.5)
Chloride: 101 mEq/L (ref 96–112)
Creatinine, Ser: 0.99 mg/dL (ref 0.50–1.35)
GFR calc Af Amer: >90 mL/min (ref >90)
Potassium: 4.5 mEq/L (ref 3.5–5.1)
Sodium: 136 mEq/L (ref 135–145)

## 2013-10-06 MED ORDER — PREDNISONE 10 MG PO TABS: ORAL_TABLET | ORAL | Status: DC

## 2013-10-06 MED ORDER — TIOTROPIUM BROMIDE MONOHYDRATE 18 MCG IN CAPS: 18.0000 ug | ORAL_CAPSULE | Freq: Every day | RESPIRATORY_TRACT | Status: DC

## 2013-10-06 MED ORDER — LEVOFLOXACIN 750 MG PO TABS: 750.0000 mg | ORAL_TABLET | Freq: Every day | ORAL | Status: DC

## 2013-10-06 MED ORDER — BUDESONIDE-FORMOTEROL FUMARATE 80-4.5 MCG/ACT IN AERO: 2.0000 | INHALATION_SPRAY | Freq: Two times a day (BID) | RESPIRATORY_TRACT | Status: DC

## 2013-10-06 MED ORDER — BUDESONIDE-FORMOTEROL FUMARATE 80-4.5 MCG/ACT IN AERO
2.0000 | INHALATION_SPRAY | Freq: Two times a day (BID) | RESPIRATORY_TRACT | Status: DC
  Administered 2013-10-06: 2 via RESPIRATORY_TRACT
  Filled 2013-10-06: qty 6.9

## 2013-10-06 MED ORDER — ALBUTEROL SULFATE HFA 108 (90 BASE) MCG/ACT IN AERS: 2.0000 | INHALATION_SPRAY | RESPIRATORY_TRACT | Status: DC | PRN

## 2013-10-06 MED ORDER — TIOTROPIUM BROMIDE MONOHYDRATE 18 MCG IN CAPS
18.0000 ug | ORAL_CAPSULE | Freq: Every day | RESPIRATORY_TRACT | Status: DC
Start: 2013-10-06 — End: 2013-10-06
  Administered 2013-10-06: 18 ug via RESPIRATORY_TRACT
  Filled 2013-10-06: qty 5

## 2013-10-06 MED ORDER — ALBUTEROL SULFATE HFA 108 (90 BASE) MCG/ACT IN AERS
2.0000 | INHALATION_SPRAY | RESPIRATORY_TRACT | Status: DC
  Administered 2013-10-06: 2 via RESPIRATORY_TRACT
  Filled 2013-10-06: qty 6.7

## 2013-10-06 NOTE — Discharge Summary (Signed)
Physician Discharge Summary  Darren Burke [id number] DOB: [birth year] DOA: 10/05/2013  PCP: No PCP Per Patient  Admit date: 10/05/2013 Discharge date: 10/06/2013  Time spent:  Greater than 30 minutes  Recommendations for Outpatient Follow-up:  1. The patient will followup with his new primary care physician, Dr. Anastasio Burke.  Discharge Diagnoses:  Principal Problem:   Acute respiratory failure with hypoxia Active Problems:   COPD exacerbation   CAP (community acquired pneumonia)   Emphysema   PNA (pneumonia)   Sinus tachycardia   SOB (shortness of breath)   Hyperglycemia, drug-induced   Discharge Condition: Improved.  Diet recommendation: Heart healthy.  Filed Weights   10/05/13 0215  Weight: 104.327 kg (230 lb)    History of present illness:  The patient is a 67 year old man with a history of COPD, who presented to the emergency department on 10/05/2013 with a chief complaint of shortness of breath, cough, fever, and chills. In the emergency department, he was febrile with a temperature 100.3. He was hypoxic with oxygen saturation of 89% on room air. He was tachycardic for respiratory distress. His blood pressure was otherwise within normal limits. His troponin I was within normal limits. His proBNP was within normal limits. CT angiogram of his chest was ordered to rule out PE. It was negative for PE, but it did reveal mild bibasilar airspace opacity, consistent with pneumonia and emphysema in the upper lung lobes. He was admitted for further evaluation and management.  Hospital Course:  The patient received 125 mg of Solu-Medrol in the emergency department. Treatment was continued for COPD with exacerbation and community-acquired pneumonia. Oxygen was given and titrated to keep his oxygen saturations greater than 93%. Levaquin was added for antibiotic treatment. The prednisone taper was subsequently added. Bronchodilators were administered with albuterol and Atrovent  nebulizers. His cough was treated with as needed antitussive medications. For steroid-induced hyperglycemia, sliding scale NovoLog was given. For further evaluation, a number of studies were ordered. Strep pneumo and Legionella antigens were both negative. His blood cultures were negative to date and remained negative prior to discharge.  The patient improved rather quickly. On exam, he had minimal bronchospasms and few crackles prior to discharge. He began oxygenating in the lower 90s on room air. He became afebrile and remained afebrile. He became less short of breath. His cough was subsiding at the time of discharge. Prior to discharge, for long-term treatment, he was prescribed Symbicort, when necessary albuterol inhaler, and Spiriva inhaler. He was maintained on Levaquin and a prednisone taper for a few more days as well.  Procedures:  None  Consultations:  None  Discharge Exam: Filed Vitals:   10/06/13 0518  BP: 120/85  Pulse: 69  Temp: 97.9 F (36.6 C)  Resp: 20    General: Pleasant 67 year old Caucasian man sitting up in bed, in no acute distress. Cardiovascular: S1, S2, with a soft systolic murmur. Respiratory: Occasional wheezes scattered, with good air movement. Breathing is nonlabored.  Discharge Instructions  Discharge Orders   Future Orders Complete By Expires   Diet - low sodium heart healthy  As directed    Discharge instructions  As directed    Comments:     Take medications and inhalers as prescribed.   Increase activity slowly  As directed        Medication List         acetaminophen 500 MG tablet  Commonly known as:  TYLENOL  Take 500 mg by mouth every 6 (six) hours as needed for  mild pain.     albuterol 108 (90 BASE) MCG/ACT inhaler  Commonly known as:  PROVENTIL HFA;VENTOLIN HFA  Inhale 2 puffs into the lungs every 4 (four) hours as needed for wheezing or shortness of breath.     ALKA-SELTZER PLUS COLD PO  Take 1 tablet by mouth at bedtime as  needed (sleep/cough/cold).     budesonide-formoterol 80-4.5 MCG/ACT inhaler  Commonly known as:  SYMBICORT  Inhale 2 puffs into the lungs 2 (two) times daily.     levofloxacin 750 MG tablet  Commonly known as:  LEVAQUIN  Take 1 tablet (750 mg total) by mouth daily. Antibiotic to be taken for 7 more days.     predniSONE 10 MG tablet  Commonly known as:  DELTASONE  Prednisone taper as follows: Take 6 tablets daily for 2 days in a row; then 5 tablets the next day for one day; then 4 tablets the next day for one day; then 3 tablets the next day for one day; then 2 tablets the next day for one day; then 1 tablet the next day; then stop.     tiotropium 18 MCG inhalation capsule  Commonly known as:  SPIRIVA  Place 1 capsule (18 mcg total) into inhaler and inhale daily.       Allergies  Allergen Reactions  . Penicillins Hives and Itching       Follow-up Information   Follow up with Doree Albee, MD On 10/09/2013. (at 10:20)    Specialty:  Internal Medicine   Contact information:   [facility] [address] [place] [address] [phone]        The results of significant diagnostics from this hospitalization (including imaging, microbiology, ancillary and laboratory) are listed below for reference.    Significant Diagnostic Studies: Ct Angio Chest Pe W/cm &/or Wo Cm  10/05/2013   CLINICAL DATA:  Tachycardia and shortness of breath.  EXAM: CT ANGIOGRAPHY CHEST WITH CONTRAST  TECHNIQUE: Multidetector CT imaging of the chest was performed using the standard protocol during bolus administration of intravenous contrast. Multiplanar CT image reconstructions including MIPs were obtained to evaluate the vascular anatomy.  CONTRAST:  135mL OMNIPAQUE IOHEXOL 350 MG/ML SOLN  COMPARISON:  Chest radiograph performed earlier today at 2:32 a.m.  FINDINGS: There is no evidence of pulmonary embolus.  Mild right basilar airspace opacity may reflect atelectasis or possibly mild  pneumonia. A small lymph node is noted along the right minor fissure. Emphysema is noted at the upper lung lobes. The left lung is otherwise clear. There is no evidence of pleural effusion or pneumothorax. No masses are identified; no abnormal focal contrast enhancement is seen.  Diffuse coronary artery calcifications are seen. The mediastinum is otherwise unremarkable in appearance. No mediastinal lymphadenopathy is seen. No pericardial effusion is identified. Scattered calcific atherosclerotic disease is noted along the aortic arch and proximal great vessels; the proximal great vessels are otherwise unremarkable. No axillary lymphadenopathy is seen. The visualized portions of the thyroid gland are unremarkable in appearance.  The visualized portions of the liver and spleen are unremarkable.  No acute osseous abnormalities are seen.  Review of the MIP images confirms the above findings.  IMPRESSION: 1. No evidence of pulmonary embolus. 2. Mild right basilar airspace opacity may reflect atelectasis or possibly mild pneumonia. 3. Emphysema at the upper lung lobes. 4. Diffuse coronary artery calcifications seen.   Electronically Signed   By: Garald Balding M.D.   On: 10/05/2013 05:09   Dg Chest Portable 1  View  10/05/2013   CLINICAL DATA:  Shortness of breath and cough for 1 week. History of smoking.  EXAM: PORTABLE CHEST - 1 VIEW  COMPARISON:  Chest radiograph performed 01/29/2011  FINDINGS: The lungs are hyperexpanded, with flattening of the hemidiaphragms, compatible with COPD. Vascular congestion is noted. Bibasilar airspace opacities could reflect minimal interstitial edema. No pleural effusion or pneumothorax is seen.  The cardiomediastinal silhouette is within normal limits. No acute osseous abnormalities are seen.  IMPRESSION: 1. Vascular congestion noted. Bibasilar airspace opacities could reflect minimal interstitial edema. 2. Findings of COPD.   Electronically Signed   By: Garald Balding M.D.   On:  10/05/2013 03:07    Microbiology: Recent Results (from the past 240 hour(s))  CULTURE, BLOOD (ROUTINE X 2)     Status: None   Collection Time    10/05/13  6:52 AM      Result Value Range Status   Specimen Description Blood RIGHT HAND   Final   Special Requests BOTTLES DRAWN AEROBIC AND ANAEROBIC 6CC EACH   Final   Culture NO GROWTH <24 HRS   Final   Report Status PENDING   Incomplete  CULTURE, BLOOD (ROUTINE X 2)     Status: None   Collection Time    10/05/13  6:52 AM      Result Value Range Status   Specimen Description Blood RIGHT ARM   Final   Special Requests BOTTLES DRAWN AEROBIC AND ANAEROBIC 8 CC EACH   Final   Culture NO GROWTH <24 HRS   Final   Report Status PENDING   Incomplete     Labs: Basic Metabolic Panel:  Recent Labs Lab 10/05/13 0222 10/06/13 0558  NA 135 136  K 3.6 4.5  CL 94* 101  CO2 28 27  GLUCOSE 138* 135*  BUN 14 24*  CREATININE 0.98 0.99  CALCIUM 8.9 8.6   Liver Function Tests:  Recent Labs Lab 10/05/13 0222  AST 21  ALT 28  ALKPHOS 54  BILITOT 0.4  PROT 8.4*  ALBUMIN 3.5   No results found for this basename: LIPASE, AMYLASE,  in the last 168 hours No results found for this basename: AMMONIA,  in the last 168 hours CBC:  Recent Labs Lab 10/05/13 0222 10/06/13 0558  WBC 9.7 12.6*  NEUTROABS 6.6  --   HGB 14.9 13.1  HCT 45.0 41.2  MCV 95.1 96.0  PLT 183 167   Cardiac Enzymes:  Recent Labs Lab 10/05/13 0222  TROPONINI <0.30   BNP: BNP (last 3 results)  Recent Labs  10/05/13 0222  PROBNP 24.0   CBG:  Recent Labs Lab 10/05/13 1723 10/05/13 2137 10/06/13 0732 10/06/13 1111  GLUCAP 103* 195* 105* 111*       Signed:  Jessieca Rhem  [facility] 10/06/2013, 6:47 PM

## 2013-10-06 NOTE — Progress Notes (Signed)
Room air saturation 91%. Schonewitz, Eulis Canner 10/06/2013

## 2013-10-06 NOTE — Progress Notes (Signed)
D/c instructions reviewed with patient.  Verbalized understanding. Pt dc'd to home with family. Schonewitz, Eulis Canner 10/06/2013

## 2013-10-10 LAB — CULTURE, BLOOD (ROUTINE X 2): Culture: NO GROWTH

## 2013-10-20 DIAGNOSIS — Z0000 Encounter for general adult medical examination without abnormal findings: Secondary | ICD-10-CM | POA: Diagnosis not present

## 2013-10-20 DIAGNOSIS — F410 Panic disorder [episodic paroxysmal anxiety] without agoraphobia: Secondary | ICD-10-CM | POA: Diagnosis not present

## 2013-10-20 DIAGNOSIS — J449 Chronic obstructive pulmonary disease, unspecified: Secondary | ICD-10-CM | POA: Diagnosis not present

## 2013-10-20 DIAGNOSIS — R7309 Other abnormal glucose: Secondary | ICD-10-CM | POA: Diagnosis not present

## 2013-10-20 DIAGNOSIS — E669 Obesity, unspecified: Secondary | ICD-10-CM | POA: Diagnosis not present

## 2013-10-20 DIAGNOSIS — Z125 Encounter for screening for malignant neoplasm of prostate: Secondary | ICD-10-CM | POA: Diagnosis not present

## 2014-02-06 ENCOUNTER — Emergency Department (HOSPITAL_COMMUNITY): Payer: Medicare Other

## 2014-02-06 ENCOUNTER — Emergency Department (HOSPITAL_COMMUNITY)
Admission: EM | Admit: 2014-02-06 | Discharge: 2014-02-06 | Disposition: A | Discharge status: Home / Self Care | Payer: Medicare Other | Attending: Emergency Medicine | Admitting: Emergency Medicine

## 2014-02-06 ENCOUNTER — Encounter (HOSPITAL_COMMUNITY): Payer: Self-pay | Admitting: Emergency Medicine

## 2014-02-06 DIAGNOSIS — IMO0002 Reserved for concepts with insufficient information to code with codable children: Secondary | ICD-10-CM | POA: Diagnosis not present

## 2014-02-06 DIAGNOSIS — Z79899 Other long term (current) drug therapy: Secondary | ICD-10-CM | POA: Insufficient documentation

## 2014-02-06 DIAGNOSIS — J449 Chronic obstructive pulmonary disease, unspecified: Secondary | ICD-10-CM | POA: Diagnosis not present

## 2014-02-06 DIAGNOSIS — J4489 Other specified chronic obstructive pulmonary disease: Secondary | ICD-10-CM | POA: Insufficient documentation

## 2014-02-06 DIAGNOSIS — Z880 Allergy status to penicillin: Secondary | ICD-10-CM | POA: Diagnosis not present

## 2014-02-06 DIAGNOSIS — Z87891 Personal history of nicotine dependence: Secondary | ICD-10-CM | POA: Insufficient documentation

## 2014-02-06 DIAGNOSIS — K5900 Constipation, unspecified: Secondary | ICD-10-CM | POA: Insufficient documentation

## 2014-02-06 NOTE — ED Notes (Signed)
MD at bedside. 

## 2014-02-06 NOTE — Discharge Instructions (Signed)
Constipation, Adult Constipation is when a person:  Poops (bowel movement) less than 3 times a week.  Has a hard time pooping.  Has poop that is dry, hard, or bigger than normal. HOME CARE   Eat more fiber, such as fruits, vegetables, whole grains like brown rice, and beans.  Eat less fatty foods and sugar. This includes [place] fries, hamburgers, cookies, candy, and soda.  If you are not getting enough fiber from food, take products with added fiber in them (supplements).  Drink enough fluid to keep your pee (urine) clear or pale yellow.  Go to the restroom when you feel like you need to poop. Do not hold it.  Only take medicine as told by your doctor. Do not take medicines that help you poop (laxatives) without talking to your doctor first.  Exercise on a regular basis, or as told by your doctor. GET HELP RIGHT AWAY IF:   You have bright red blood in your poop (stool).  Your constipation lasts more than 4 days or gets worse.  You have belly (abdomen) or butt (rectal) pain.  You have thin poop (as thin as a pencil).  You lose weight, and it cannot be explained. MAKE SURE YOU:   Understand these instructions.  Will watch your condition.  Will get help right away if you are not doing well or get worse. Document Released: 03/30/2008 Document Revised: 01/04/2012 Document Reviewed: 07/24/2013 [facility] Patient Information 2014 [place], [place].   Increase fluids, fruits, fiber.    Try Magnesium citrate [OTC] for constiopation

## 2014-02-06 NOTE — ED Notes (Signed)
Patient c/o generalized abd pain with cramping. Patient denies any nausea, vomiting, and diarrhea. Denies any fevers. Per patient last BM 3 days ago with a very small one last night. Denies any blood in stool. Per patient normal for him to go 3 days without BM. Patient reports "passing gas" this morning, then was unable to. Per patient that is when pain started.

## 2014-02-06 NOTE — ED Provider Notes (Signed)
CSN: [id number]     Arrival date & time 02/06/14  0729 History  This chart was scribed for Darren Christen, MD by Roxan Diesel, ED scribe.  This patient was seen in room APA08/APA08 and the patient's care was started at 8:09 AM.   Chief Complaint  Patient presents with  . Abdominal Pain    The history is provided by the patient. No language interpreter was used.    HPI Comments: Darren Burke is a 68 y.o. male who presents to the Emergency Department complaining of generalized cramping abdominal pain that began 3 hours ago.  Pt states that last night at around 8:30 PM he ate a large dinner characterized as "chicken poppers," pizza, and cinnamon pie from [employer].  He went to the restroom to move his bowels and had a "so-so" bowel movement.  This morning at around 5 AM he developed cramping pain throughout his abdomen.  He was passing gas and reports his pain was temporarily improved each time that he passed gas.  However at around 6:30 AM he began to have difficulty passing gas so his pain worsened.  He states he finally began passing gas again only recently.  He feels slightly improved currently but he still feels "bloated."  Pt did not eat today but did drink some orange juice.  Pt notes that in 2013 he was seen here for similar symptoms and given a medication that caused him to pass gas which improved his symptoms.  He denies h/o abdominal surgery.  Pt has h/o COPD.  He quit smoking 5 years ago.  PCP is Dr. Legrand Rams    Past Medical History  Diagnosis Date  . COPD (chronic obstructive pulmonary disease)     Emphysema radiographically    Past Surgical History  Procedure Laterality Date  . Artery repair      sinus    Family History  Problem Relation Age of Onset  . Heart failure Mother   . Stroke Father      History  Substance Use Topics  . Smoking status: Former Smoker -- 3.00 packs/day for 40 years    Types: Cigarettes  . Smokeless tobacco: Never Used  . Alcohol Use: No      Review of Systems A complete 10 system review of systems was obtained and all systems are negative except as noted in the HPI and PMH.     Allergies  Penicillins  Home Medications   Prior to Admission medications   Medication Sig Start Date End Date Taking? Authorizing Provider  acetaminophen (TYLENOL) 500 MG tablet Take 500 mg by mouth every 6 (six) hours as needed for mild pain.    Historical Provider, MD  albuterol (PROVENTIL HFA;VENTOLIN HFA) 108 (90 BASE) MCG/ACT inhaler Inhale 2 puffs into the lungs every 4 (four) hours as needed for wheezing or shortness of breath. 10/06/13   Rexene Alberts, MD  budesonide-formoterol (SYMBICORT) 80-4.5 MCG/ACT inhaler Inhale 2 puffs into the lungs 2 (two) times daily. 10/06/13   Rexene Alberts, MD  Chlorphen-Phenyleph-ASA (ALKA-SELTZER PLUS COLD PO) Take 1 tablet by mouth at bedtime as needed (sleep/cough/cold).    Historical Provider, MD  levofloxacin (LEVAQUIN) 750 MG tablet Take 1 tablet (750 mg total) by mouth daily. Antibiotic to be taken for 7 more days. 10/06/13   Rexene Alberts, MD  predniSONE (DELTASONE) 10 MG tablet Prednisone taper as follows: Take 6 tablets daily for 2 days in a row; then 5 tablets the next day for one day; then 4 tablets  the next day for one day; then 3 tablets the next day for one day; then 2 tablets the next day for one day; then 1 tablet the next day; then stop. 10/06/13   Rexene Alberts, MD  tiotropium (SPIRIVA) 18 MCG inhalation capsule Place 1 capsule (18 mcg total) into inhaler and inhale daily. 10/06/13   Rexene Alberts, MD   BP 128/81  Pulse 95  Temp(Src) 97.9 F (36.6 C) (Oral)  Resp 24  Ht 5' 10.5" (1.791 m)  Wt 245 lb (111.131 kg)  BMI 34.65 kg/m2  SpO2 92%  Physical Exam  Nursing note and vitals reviewed. Constitutional: He is oriented to person, place, and time. He appears well-developed and well-nourished.  HENT:  Head: Normocephalic and atraumatic.  Eyes: Conjunctivae and EOM are normal.  Pupils are equal, round, and reactive to light.  Neck: Normal range of motion. Neck supple.  Cardiovascular: Normal rate, regular rhythm and normal heart sounds.   Pulmonary/Chest: Effort normal and breath sounds normal.  Abdominal: Soft. Bowel sounds are normal. He exhibits distension (mild). There is no tenderness.  Musculoskeletal: Normal range of motion.  Neurological: He is alert and oriented to person, place, and time.  Skin: Skin is warm and dry.  Psychiatric: He has a normal mood and affect. His behavior is normal.    ED Course  Procedures (including critical care time)  DIAGNOSTIC STUDIES: Oxygen Saturation is 92% on room air, low by my interpretation.    COORDINATION OF CARE: 8:13 AM-Discussed treatment plan which includes acute abdominal series to evaluate for obstruction with pt at bedside and pt agreed to plan.    Dg Abd Acute W/chest  02/06/2014   CLINICAL DATA:  Bloating, rule out small bowel obstruction.  EXAM: ACUTE ABDOMEN SERIES (ABDOMEN 2 VIEW & CHEST 1 VIEW)  COMPARISON:  DG CHEST 1V PORT dated 10/05/2013; DG ABD ACUTE W/CHEST dated 05/24/2012  FINDINGS: There is hyperinflation of the lungs compatible with COPD. No confluent airspace opacities or effusions. Heart is normal size.  Nonobstructive bowel gas pattern. No free air organomegaly. No suspicious calcification.  IMPRESSION: COPD.  No acute findings.  No evidence of bowel obstruction or free air.   Electronically Signed   By: Rolm Baptise M.D.   On: 02/06/2014 08:51     MDM   Final diagnoses:  None    No acute abdomen. Patient feels much better after bowel movement    I personally performed the services described in this documentation, which was scribed in my presence. The recorded information has been reviewed and is accurate.   Darren Christen, MD 02/06/14 1104

## 2014-11-09 ENCOUNTER — Emergency Department (HOSPITAL_COMMUNITY): Payer: Medicare Other

## 2014-11-09 ENCOUNTER — Emergency Department (HOSPITAL_COMMUNITY)
Admission: EM | Admit: 2014-11-09 | Discharge: 2014-11-09 | Disposition: A | Discharge status: Home / Self Care | Payer: Medicare Other | Source: Home / Self Care | Attending: Emergency Medicine | Admitting: Emergency Medicine

## 2014-11-09 ENCOUNTER — Encounter (HOSPITAL_COMMUNITY): Payer: Self-pay | Admitting: *Deleted

## 2014-11-09 DIAGNOSIS — B349 Viral infection, unspecified: Secondary | ICD-10-CM | POA: Insufficient documentation

## 2014-11-09 DIAGNOSIS — R05 Cough: Secondary | ICD-10-CM | POA: Diagnosis not present

## 2014-11-09 DIAGNOSIS — R059 Cough, unspecified: Secondary | ICD-10-CM

## 2014-11-09 DIAGNOSIS — Z8249 Family history of ischemic heart disease and other diseases of the circulatory system: Secondary | ICD-10-CM | POA: Diagnosis not present

## 2014-11-09 DIAGNOSIS — Z880 Allergy status to penicillin: Secondary | ICD-10-CM | POA: Insufficient documentation

## 2014-11-09 DIAGNOSIS — J189 Pneumonia, unspecified organism: Secondary | ICD-10-CM | POA: Diagnosis not present

## 2014-11-09 DIAGNOSIS — Z823 Family history of stroke: Secondary | ICD-10-CM | POA: Diagnosis not present

## 2014-11-09 DIAGNOSIS — J984 Other disorders of lung: Secondary | ICD-10-CM | POA: Diagnosis not present

## 2014-11-09 DIAGNOSIS — J181 Lobar pneumonia, unspecified organism: Secondary | ICD-10-CM | POA: Diagnosis not present

## 2014-11-09 DIAGNOSIS — R000 Tachycardia, unspecified: Secondary | ICD-10-CM | POA: Insufficient documentation

## 2014-11-09 DIAGNOSIS — J449 Chronic obstructive pulmonary disease, unspecified: Secondary | ICD-10-CM | POA: Diagnosis not present

## 2014-11-09 DIAGNOSIS — Z79899 Other long term (current) drug therapy: Secondary | ICD-10-CM | POA: Insufficient documentation

## 2014-11-09 DIAGNOSIS — R509 Fever, unspecified: Secondary | ICD-10-CM | POA: Diagnosis not present

## 2014-11-09 DIAGNOSIS — J9621 Acute and chronic respiratory failure with hypoxia: Secondary | ICD-10-CM | POA: Diagnosis not present

## 2014-11-09 DIAGNOSIS — Z87891 Personal history of nicotine dependence: Secondary | ICD-10-CM | POA: Diagnosis not present

## 2014-11-09 DIAGNOSIS — R0902 Hypoxemia: Secondary | ICD-10-CM | POA: Diagnosis not present

## 2014-11-09 DIAGNOSIS — J441 Chronic obstructive pulmonary disease with (acute) exacerbation: Secondary | ICD-10-CM | POA: Diagnosis not present

## 2014-11-09 LAB — I-STAT CG4 LACTIC ACID, ED: Lactic Acid, Venous: 1.74 mmol/L (ref 0.5–2.2)

## 2014-11-09 LAB — BASIC METABOLIC PANEL
Anion gap: 7 (ref 5–15)
BUN: 16 mg/dL (ref 6–23)
CO2: 27 mmol/L (ref 19–32)
Calcium: 8.9 mg/dL (ref 8.4–10.5)
Chloride: 104 mEq/L (ref 96–112)
Creatinine, Ser: 0.99 mg/dL (ref 0.50–1.35)
GFR, EST NON AFRICAN AMERICAN: 83 mL/min — AB (ref >90)
GLUCOSE: 109 mg/dL — AB (ref 70–99)
Potassium: 3.5 mmol/L (ref 3.5–5.1)
Sodium: 138 mmol/L (ref 135–145)

## 2014-11-09 LAB — CBC WITH DIFFERENTIAL/PLATELET
BASOS ABS: 0 10*3/uL (ref 0.0–0.1)
BASOS PCT: 0 % (ref 0–1)
EOS ABS: 0 10*3/uL (ref 0.0–0.7)
Eosinophils Relative: 0 % (ref 0–5)
HCT: 43.6 % (ref 39.0–52.0)
Hemoglobin: 13.7 g/dL (ref 13.0–17.0)
LYMPHS ABS: 1.6 10*3/uL (ref 0.7–4.0)
Lymphocytes Relative: 10 % — ABNORMAL LOW (ref 12–46)
MCH: 30 pg (ref 26.0–34.0)
MCHC: 31.4 g/dL (ref 30.0–36.0)
MCV: 95.4 fL (ref 78.0–100.0)
Monocytes Absolute: 1.4 10*3/uL — ABNORMAL HIGH (ref 0.1–1.0)
Monocytes Relative: 9 % (ref 3–12)
NEUTROS PCT: 81 % — AB (ref 43–77)
Neutro Abs: 13 10*3/uL — ABNORMAL HIGH (ref 1.7–7.7)
Platelets: 220 10*3/uL (ref 150–400)
RBC: 4.57 MIL/uL (ref 4.22–5.81)
RDW: 13.3 % (ref 11.5–15.5)
WBC: 16 10*3/uL — AB (ref 4.0–10.5)

## 2014-11-09 MED ORDER — TIOTROPIUM BROMIDE MONOHYDRATE 18 MCG IN CAPS: 18.0000 ug | ORAL_CAPSULE | Freq: Every day | RESPIRATORY_TRACT | Status: DC

## 2014-11-09 MED ORDER — SODIUM CHLORIDE 0.9 % IV SOLN
INTRAVENOUS | Status: DC
  Administered 2014-11-09: 17:00:00 via INTRAVENOUS

## 2014-11-09 MED ORDER — HYDROCODONE-ACETAMINOPHEN 5-325 MG PO TABS: 1.0000 | ORAL_TABLET | Freq: Four times a day (QID) | ORAL | Status: DC | PRN

## 2014-11-09 MED ORDER — ONDANSETRON HCL 4 MG/2ML IJ SOLN
4.0000 mg | Freq: Once | INTRAMUSCULAR | Status: AC
  Administered 2014-11-09: 4 mg via INTRAVENOUS
  Filled 2014-11-09: qty 2

## 2014-11-09 MED ORDER — ALBUTEROL SULFATE HFA 108 (90 BASE) MCG/ACT IN AERS: 1.0000 | INHALATION_SPRAY | Freq: Four times a day (QID) | RESPIRATORY_TRACT | Status: DC | PRN

## 2014-11-09 MED ORDER — SODIUM CHLORIDE 0.9 % IV BOLUS (SEPSIS)
500.0000 mL | Freq: Once | INTRAVENOUS | Status: AC
  Administered 2014-11-09: 500 mL via INTRAVENOUS

## 2014-11-09 MED ORDER — HYDROMORPHONE HCL 1 MG/ML IJ SOLN
0.5000 mg | Freq: Once | INTRAMUSCULAR | Status: AC
  Administered 2014-11-09: 0.5 mg via INTRAVENOUS
  Filled 2014-11-09: qty 1

## 2014-11-09 NOTE — Discharge Instructions (Signed)
Return for any new or worse symptoms. Suspect that you are coming down with some type of illness Sophie get worse definitely return. Provided a prescription for some hydrocodone to help with the mild headache. Also renewed your albuterol in your Spiriva. Make an appointment to follow-up with your doctor in the next few days.

## 2014-11-09 NOTE — ED Notes (Signed)
IV accidentally removed by pt.

## 2014-11-09 NOTE — ED Provider Notes (Signed)
CSN: [id number]     Arrival date & time 11/09/14  1353 History  This chart was scribed for Fredia Sorrow, MD by Delphia Grates, ED Scribe. This patient was seen in room APA01/APA01 and the patient's care was started at 4:16 PM.    Chief Complaint  Patient presents with  . Chills   Patient is a 69 y.o. male presenting with weakness. The history is provided by the patient and medical records. No language interpreter was used.  Weakness This is a new problem. The current episode started yesterday. The problem occurs rarely. The problem has not changed since onset.Associated symptoms include headaches and shortness of breath. Pertinent negatives include no chest pain and no abdominal pain. Nothing aggravates the symptoms. Nothing relieves the symptoms. He has tried nothing for the symptoms. The treatment provided no relief.   HPI Comments: Darren Burke is a 69 y.o. male with PMhx of COPD presents to the Emergency Department complaining of sudden onset chills since 2330 last night. Patient states he is established with a PCP, Dr. Legrand Rams, however, he was unable to be seen today. There is associated weakness, subjective fever, and productive cough. Patient is unsure the cause of his symptoms. He denies sore throat, nausea, vomiting, or diarrhea. He reports he was prescribed Spiriva, by Dr. Caryn Section, hopistalist, but is currently out of this medication along with his albuterol and is requesting refills.    Past Medical History  Diagnosis Date  . COPD (chronic obstructive pulmonary disease)     Emphysema radiographically   Past Surgical History  Procedure Laterality Date  . Artery repair      sinus   Family History  Problem Relation Age of Onset  . Heart failure Mother   . Stroke Father    History  Substance Use Topics  . Smoking status: Former Smoker -- 3.00 packs/day for 40 years    Types: Cigarettes  . Smokeless tobacco: Never Used  . Alcohol Use: No    Review of Systems   Constitutional: Positive for fever (subjective) and chills.  HENT: Negative for sore throat.   Eyes: Negative for visual disturbance.  Respiratory: Positive for cough (productive) and shortness of breath.   Cardiovascular: Negative for chest pain and leg swelling.  Gastrointestinal: Negative for nausea, vomiting, abdominal pain and diarrhea.  Genitourinary: Negative for dysuria.  Musculoskeletal: Negative for back pain.  Skin: Negative for rash.  Neurological: Positive for weakness and headaches.  Hematological: Does not bruise/bleed easily.  Psychiatric/Behavioral: Negative for confusion.      Allergies  Penicillins  Home Medications   Prior to Admission medications   Medication Sig Start Date End Date Taking? Authorizing Provider  acetaminophen (TYLENOL) 500 MG tablet Take 500 mg by mouth every 6 (six) hours as needed. pain   Yes Historical Provider, MD  albuterol (PROVENTIL HFA;VENTOLIN HFA) 108 (90 BASE) MCG/ACT inhaler Inhale 2 puffs into the lungs every 4 (four) hours as needed for wheezing or shortness of breath. 10/06/13  Yes Rexene Alberts, MD  aspirin-sod bicarb-citric acid (ALKA-SELTZER) 325 MG TBEF tablet Take 325 mg by mouth daily as needed (for pain).   Yes Historical Provider, MD  tiotropium (SPIRIVA) 18 MCG inhalation capsule Place 1 capsule (18 mcg total) into inhaler and inhale daily. 10/06/13  Yes Rexene Alberts, MD  albuterol (PROVENTIL HFA;VENTOLIN HFA) 108 (90 BASE) MCG/ACT inhaler Inhale 1-2 puffs into the lungs every 6 (six) hours as needed for wheezing or shortness of breath. 11/09/14   Fredia Sorrow, MD  HYDROcodone-acetaminophen (  NORCO/VICODIN) 5-325 MG per tablet Take 1-2 tablets by mouth every 6 (six) hours as needed. 11/09/14   Fredia Sorrow, MD  tiotropium (SPIRIVA HANDIHALER) 18 MCG inhalation capsule Place 1 capsule (18 mcg total) into inhaler and inhale daily. 11/09/14   Fredia Sorrow, MD   BP 123/85 mmHg  Pulse 126  Temp(Src) 98.8 F (37.1 C)  (Oral)  Resp 22  Ht 6' (1.829 m)  Wt 240 lb (108.863 kg)  BMI 32.54 kg/m2  SpO2 94%  Physical Exam  Constitutional: He is oriented to person, place, and time. He appears well-developed and well-nourished. No distress.  HENT:  Head: Normocephalic and atraumatic.  Eyes: Conjunctivae and EOM are normal.  Neck: Neck supple. No tracheal deviation present.  Cardiovascular: Regular rhythm and normal heart sounds.  Tachycardia present.   No murmur heard. Tachycardic, but regular.  Pulmonary/Chest: Effort normal and breath sounds normal. No respiratory distress.  Lungs clear bilaterally.  Abdominal: Soft. Bowel sounds are normal. There is no tenderness.  Musculoskeletal: Normal range of motion. He exhibits edema.  Trace pitting edema to both legs.  Neurological: He is alert and oriented to person, place, and time.  Skin: Skin is warm and dry.  Psychiatric: He has a normal mood and affect. His behavior is normal.  Nursing note and vitals reviewed.   ED Course  Procedures (including critical care time)  DIAGNOSTIC STUDIES: Oxygen Saturation is 94% on RA, low by my interpretation.    COORDINATION OF CARE: 4:20 PM- Pt advised of plan for treatment and pt agrees.  Medications  0.9 %  sodium chloride infusion ( Intravenous Stopped 11/09/14 1852)  sodium chloride 0.9 % bolus 500 mL (0 mLs Intravenous Stopped 11/09/14 1700)  sodium chloride 0.9 % bolus 500 mL (0 mLs Intravenous Stopped 11/09/14 1852)  ondansetron (ZOFRAN) injection 4 mg (4 mg Intravenous Given 11/09/14 1838)  HYDROmorphone (DILAUDID) injection 0.5 mg (0.5 mg Intravenous Given 11/09/14 1838)   Results for orders placed or performed during the hospital encounter of 11/09/14  CBC with Differential  Result Value Ref Range   WBC 16.0 (H) 4.0 - 10.5 K/uL   RBC 4.57 4.22 - 5.81 MIL/uL   Hemoglobin 13.7 13.0 - 17.0 g/dL   HCT 43.6 39.0 - 52.0 %   MCV 95.4 78.0 - 100.0 fL   MCH 30.0 26.0 - 34.0 pg   MCHC 31.4 30.0 - 36.0 g/dL    RDW 13.3 11.5 - 15.5 %   Platelets 220 150 - 400 K/uL   Neutrophils Relative % 81 (H) 43 - 77 %   Neutro Abs 13.0 (H) 1.7 - 7.7 K/uL   Lymphocytes Relative 10 (L) 12 - 46 %   Lymphs Abs 1.6 0.7 - 4.0 K/uL   Monocytes Relative 9 3 - 12 %   Monocytes Absolute 1.4 (H) 0.1 - 1.0 K/uL   Eosinophils Relative 0 0 - 5 %   Eosinophils Absolute 0.0 0.0 - 0.7 K/uL   Basophils Relative 0 0 - 1 %   Basophils Absolute 0.0 0.0 - 0.1 K/uL  Basic metabolic panel  Result Value Ref Range   Sodium 138 135 - 145 mmol/L   Potassium 3.5 3.5 - 5.1 mmol/L   Chloride 104 96 - 112 mEq/L   CO2 27 19 - 32 mmol/L   Glucose, Bld 109 (H) 70 - 99 mg/dL   BUN 16 6 - 23 mg/dL   Creatinine, Ser 0.99 0.50 - 1.35 mg/dL   Calcium 8.9 8.4 - 10.5 mg/dL  GFR calc non Af Amer 83 (L) >90 mL/min   GFR calc Af Amer >90 >90 mL/min   Anion gap 7 5 - 15  I-Stat CG4 Lactic Acid, ED  Result Value Ref Range   Lactic Acid, Venous 1.74 0.5 - 2.2 mmol/L   Imaging Review Dg Chest 2 View  11/09/2014   CLINICAL DATA:  Cough for 2 days, chills, high fall risk, COPD, former smoker  EXAM: CHEST  2 VIEW  COMPARISON:  02/06/2014  FINDINGS: Normal heart size, mediastinal contours and pulmonary vascularity.  Lungs are emphysematous but clear.  Hepatic technique.  No pleural effusion or pneumothorax.  Bones unremarkable.  IMPRESSION: COPD changes.  No acute abnormalities.   Electronically Signed   By: Lavonia Dana M.D.   On: 11/09/2014 17:36      EKG Interpretation   Date/Time:  Friday November 09 2014 17:04:15 EST Ventricular Rate:  112 PR Interval:  150 QRS Duration: 102 QT Interval:  330 QTC Calculation: 450 R Axis:   -32 Text Interpretation:  Sinus tachycardia Left axis deviation Low voltage  QRS Cannot rule out Anterior infarct , age undetermined Abnormal ECG  Confirmed by Jams Trickett  MD, [place] Rogacki [phone]) on 11/09/2014 5:17:22 PM      MDM   Final diagnoses:  Cough  Viral illness    Patient with extensive workup no  significant findings. Still with some mild tachycardia but patient feels much better. Patient has known COPD no evidence of pneumonia. Lactic acid is not elevated. Chest x-ray negative for pneumonia or any other findings. Patient does have a leukocytosis again making concern for some developing infection. No significant electrolyte abnormalities. And as stated lactic acid was not elevated. Patient given precautions will be discharged home have renewed his albuterol inhaler and Spiriva which he was out of. Also provided some hydrocodone for the mild headache. Patient knows to return for any new or worse symptoms. Patient without fever here.  I personally performed the services described in this documentation, which was scribed in my presence. The recorded information has been reviewed and is accurate.     Fredia Sorrow, MD 11/09/14 2005

## 2014-11-09 NOTE — ED Notes (Signed)
Pt states he has had chills since 0300 today, denies N/V/D, pt also co cough and urinary frequency.

## 2014-11-10 ENCOUNTER — Emergency Department (HOSPITAL_COMMUNITY): Payer: Medicare Other

## 2014-11-10 ENCOUNTER — Encounter (HOSPITAL_COMMUNITY): Payer: Self-pay | Admitting: *Deleted

## 2014-11-10 ENCOUNTER — Inpatient Hospital Stay (HOSPITAL_COMMUNITY)
Admission: EM | Admit: 2014-11-10 | Discharge: 2014-11-13 | DRG: 193 | Disposition: A | Discharge status: Home / Self Care | Payer: Medicare Other | Attending: Internal Medicine | Admitting: Internal Medicine

## 2014-11-10 DIAGNOSIS — J181 Lobar pneumonia, unspecified organism: Secondary | ICD-10-CM | POA: Diagnosis not present

## 2014-11-10 DIAGNOSIS — Z8249 Family history of ischemic heart disease and other diseases of the circulatory system: Secondary | ICD-10-CM | POA: Diagnosis not present

## 2014-11-10 DIAGNOSIS — R509 Fever, unspecified: Secondary | ICD-10-CM

## 2014-11-10 DIAGNOSIS — R0602 Shortness of breath: Secondary | ICD-10-CM | POA: Diagnosis present

## 2014-11-10 DIAGNOSIS — R0902 Hypoxemia: Secondary | ICD-10-CM | POA: Diagnosis not present

## 2014-11-10 DIAGNOSIS — J439 Emphysema, unspecified: Secondary | ICD-10-CM | POA: Diagnosis present

## 2014-11-10 DIAGNOSIS — J984 Other disorders of lung: Secondary | ICD-10-CM | POA: Diagnosis not present

## 2014-11-10 DIAGNOSIS — Z823 Family history of stroke: Secondary | ICD-10-CM

## 2014-11-10 DIAGNOSIS — Z87891 Personal history of nicotine dependence: Secondary | ICD-10-CM | POA: Diagnosis not present

## 2014-11-10 DIAGNOSIS — J189 Pneumonia, unspecified organism: Principal | ICD-10-CM | POA: Diagnosis present

## 2014-11-10 DIAGNOSIS — J449 Chronic obstructive pulmonary disease, unspecified: Secondary | ICD-10-CM | POA: Diagnosis not present

## 2014-11-10 DIAGNOSIS — J441 Chronic obstructive pulmonary disease with (acute) exacerbation: Secondary | ICD-10-CM | POA: Diagnosis present

## 2014-11-10 DIAGNOSIS — J129 Viral pneumonia, unspecified: Secondary | ICD-10-CM | POA: Diagnosis not present

## 2014-11-10 DIAGNOSIS — J9621 Acute and chronic respiratory failure with hypoxia: Secondary | ICD-10-CM | POA: Diagnosis present

## 2014-11-10 DIAGNOSIS — R911 Solitary pulmonary nodule: Secondary | ICD-10-CM | POA: Diagnosis not present

## 2014-11-10 LAB — URINE MICROSCOPIC-ADD ON

## 2014-11-10 LAB — URINALYSIS, ROUTINE W REFLEX MICROSCOPIC
Bilirubin Urine: NEGATIVE
Glucose, UA: NEGATIVE mg/dL
Ketones, ur: NEGATIVE mg/dL
Leukocytes, UA: NEGATIVE
Nitrite: NEGATIVE
Protein, ur: 30 mg/dL — AB
SPECIFIC GRAVITY, URINE: 1.01 (ref 1.005–1.030)
Urobilinogen, UA: 0.2 mg/dL (ref 0.0–1.0)
pH: 6 (ref 5.0–8.0)

## 2014-11-10 LAB — INFLUENZA PANEL BY PCR (TYPE A & B)
H1N1 flu by pcr: NOT DETECTED
INFLAPCR: NEGATIVE
INFLBPCR: NEGATIVE

## 2014-11-10 LAB — CBC
HCT: 38.4 % — ABNORMAL LOW (ref 39.0–52.0)
HEMOGLOBIN: 12.7 g/dL — AB (ref 13.0–17.0)
MCH: 31.8 pg (ref 26.0–34.0)
MCHC: 33.1 g/dL (ref 30.0–36.0)
MCV: 96.2 fL (ref 78.0–100.0)
Platelets: 181 10*3/uL (ref 150–400)
RBC: 3.99 MIL/uL — AB (ref 4.22–5.81)
RDW: 13.4 % (ref 11.5–15.5)
WBC: 11.9 10*3/uL — AB (ref 4.0–10.5)

## 2014-11-10 MED ORDER — HYDROCODONE-ACETAMINOPHEN 5-325 MG PO TABS
1.0000 | ORAL_TABLET | Freq: Four times a day (QID) | ORAL | Status: DC | PRN
Start: 2014-11-10 — End: 2014-11-13
  Administered 2014-11-10 (×2): 1 via ORAL
  Administered 2014-11-11: 2 via ORAL
  Filled 2014-11-10: qty 2
  Filled 2014-11-10 (×2): qty 1

## 2014-11-10 MED ORDER — IOHEXOL 350 MG/ML SOLN
100.0000 mL | Freq: Once | INTRAVENOUS | Status: AC | PRN
Start: 2014-11-10 — End: 2014-11-10
  Administered 2014-11-10: 100 mL via INTRAVENOUS

## 2014-11-10 MED ORDER — SODIUM CHLORIDE 0.9 % IV BOLUS (SEPSIS)
250.0000 mL | Freq: Once | INTRAVENOUS | Status: AC
  Administered 2014-11-10: 250 mL via INTRAVENOUS

## 2014-11-10 MED ORDER — VANCOMYCIN HCL 10 G IV SOLR
2500.0000 mg | Freq: Once | INTRAVENOUS | Status: AC
  Administered 2014-11-10: 2500 mg via INTRAVENOUS
  Filled 2014-11-10: qty 2500

## 2014-11-10 MED ORDER — GUAIFENESIN ER 600 MG PO TB12
1200.0000 mg | ORAL_TABLET | Freq: Two times a day (BID) | ORAL | Status: DC
  Administered 2014-11-10 – 2014-11-12 (×5): 1200 mg via ORAL
  Filled 2014-11-10 (×5): qty 2

## 2014-11-10 MED ORDER — SODIUM CHLORIDE 0.9 % IV BOLUS (SEPSIS)
500.0000 mL | Freq: Once | INTRAVENOUS | Status: AC
  Administered 2014-11-10: 500 mL via INTRAVENOUS

## 2014-11-10 MED ORDER — ENOXAPARIN SODIUM 40 MG/0.4ML ~~LOC~~ SOLN
40.0000 mg | SUBCUTANEOUS | Status: DC
  Administered 2014-11-11 – 2014-11-13 (×3): 40 mg via SUBCUTANEOUS
  Filled 2014-11-10 (×3): qty 0.4

## 2014-11-10 MED ORDER — SODIUM CHLORIDE 0.9 % IV SOLN
500.0000 mg | Freq: Four times a day (QID) | INTRAVENOUS | Status: DC
  Administered 2014-11-10 – 2014-11-13 (×12): 500 mg via INTRAVENOUS
  Filled 2014-11-10 (×13): qty 500

## 2014-11-10 MED ORDER — ALBUTEROL SULFATE (2.5 MG/3ML) 0.083% IN NEBU
2.5000 mg | INHALATION_SOLUTION | Freq: Once | RESPIRATORY_TRACT | Status: AC
  Administered 2014-11-10: 2.5 mg via RESPIRATORY_TRACT
  Filled 2014-11-10: qty 3

## 2014-11-10 MED ORDER — ALBUTEROL SULFATE (2.5 MG/3ML) 0.083% IN NEBU: 2.5000 mg | INHALATION_SOLUTION | Freq: Four times a day (QID) | RESPIRATORY_TRACT | Status: DC

## 2014-11-10 MED ORDER — IPRATROPIUM BROMIDE 0.02 % IN SOLN: 0.5000 mg | Freq: Four times a day (QID) | RESPIRATORY_TRACT | Status: DC

## 2014-11-10 MED ORDER — LEVOFLOXACIN IN D5W 750 MG/150ML IV SOLN
INTRAVENOUS | Status: AC
  Filled 2014-11-10: qty 150

## 2014-11-10 MED ORDER — IPRATROPIUM-ALBUTEROL 0.5-2.5 (3) MG/3ML IN SOLN
3.0000 mL | Freq: Once | RESPIRATORY_TRACT | Status: AC
  Administered 2014-11-10: 3 mL via RESPIRATORY_TRACT
  Filled 2014-11-10: qty 3

## 2014-11-10 MED ORDER — VANCOMYCIN HCL IN DEXTROSE 750-5 MG/150ML-% IV SOLN
750.0000 mg | Freq: Two times a day (BID) | INTRAVENOUS | Status: DC
  Administered 2014-11-10 – 2014-11-12 (×5): 750 mg via INTRAVENOUS
  Filled 2014-11-10 (×6): qty 150

## 2014-11-10 MED ORDER — SODIUM CHLORIDE 0.9 % IV SOLN
INTRAVENOUS | Status: DC
  Administered 2014-11-10 – 2014-11-11 (×2): via INTRAVENOUS
  Administered 2014-11-11: 75 mL/h via INTRAVENOUS
  Administered 2014-11-12 – 2014-11-13 (×2): via INTRAVENOUS

## 2014-11-10 MED ORDER — LEVOFLOXACIN IN D5W 750 MG/150ML IV SOLN
750.0000 mg | Freq: Once | INTRAVENOUS | Status: AC
Start: 2014-11-10 — End: 2014-11-10
  Administered 2014-11-10: 750 mg via INTRAVENOUS
  Filled 2014-11-10: qty 150

## 2014-11-10 MED ORDER — IPRATROPIUM-ALBUTEROL 0.5-2.5 (3) MG/3ML IN SOLN
3.0000 mL | RESPIRATORY_TRACT | Status: DC
  Administered 2014-11-10 (×4): 3 mL via RESPIRATORY_TRACT
  Filled 2014-11-10 (×3): qty 3

## 2014-11-10 MED ORDER — ALBUTEROL SULFATE (2.5 MG/3ML) 0.083% IN NEBU: 2.5000 mg | INHALATION_SOLUTION | RESPIRATORY_TRACT | Status: DC

## 2014-11-10 MED ORDER — ALBUTEROL SULFATE (2.5 MG/3ML) 0.083% IN NEBU: 2.5000 mg | INHALATION_SOLUTION | RESPIRATORY_TRACT | Status: DC | PRN

## 2014-11-10 MED ORDER — IPRATROPIUM-ALBUTEROL 0.5-2.5 (3) MG/3ML IN SOLN
3.0000 mL | Freq: Four times a day (QID) | RESPIRATORY_TRACT | Status: DC
  Administered 2014-11-11 – 2014-11-13 (×10): 3 mL via RESPIRATORY_TRACT
  Filled 2014-11-10 (×10): qty 3

## 2014-11-10 NOTE — ED Provider Notes (Signed)
CSN: [id number]     Arrival date & time 11/10/14  0153 History   First MD Initiated Contact with Patient 11/10/14 0201     Chief Complaint  Patient presents with  . Nasal Congestion  . Fever  . Cough      HPI Pt was seen at 0210. Per pt, c/o gradual onset and worsening of persistent cough for the past 2 days. Has been associated with sinus congestion, SOB and subjective fevers/chills. Pt states he took tylenol approximately 1 hour PTA. Pt was extensively evaluated in the ED last night for these symptoms and was dx with viral illness. Pt states he "just doesn't feel well" so he came back to the ED for evaluation. Pt also asking ED staff of arrival "how to get some oxygen for home." Denies CP/palpitations, no abd pain, no N/V/D, no back pain, no rash, no sore throat.    Past Medical History  Diagnosis Date  . COPD (chronic obstructive pulmonary disease)     Emphysema radiographically   Past Surgical History  Procedure Laterality Date  . Artery repair      sinus   Family History  Problem Relation Age of Onset  . Heart failure Mother   . Stroke Father    History  Substance Use Topics  . Smoking status: Former Smoker -- 3.00 packs/day for 40 years    Types: Cigarettes  . Smokeless tobacco: Never Used  . Alcohol Use: No    Review of Systems ROS: Statement: All systems negative except as marked or noted in the HPI; Constitutional: +subjective fever and chills. ; ; Eyes: Negative for eye pain, redness and discharge. ; ; ENMT: Negative for ear pain, hoarseness, sore throat, +nasal congestion, sinus pressure. ; ; Cardiovascular: Negative for chest pain, palpitations, diaphoresis, and peripheral edema. ; ; Respiratory: +cough, SOB. Negative for wheezing and stridor. ; ; Gastrointestinal: Negative for nausea, vomiting, diarrhea, abdominal pain, blood in stool, hematemesis, jaundice and rectal bleeding. . ; ; Genitourinary: Negative for dysuria, flank pain and hematuria. ; ;  Musculoskeletal: Negative for back pain and neck pain. Negative for swelling and trauma.; ; Skin: Negative for pruritus, rash, abrasions, blisters, bruising and skin lesion.; ; Neuro: Negative for headache, lightheadedness and neck stiffness. Negative for weakness, altered level of consciousness , altered mental status, extremity weakness, paresthesias, involuntary movement, seizure and syncope.      Allergies  Penicillins  Home Medications   Prior to Admission medications   Medication Sig Start Date End Date Taking? Authorizing Provider  acetaminophen (TYLENOL) 500 MG tablet Take 500 mg by mouth every 6 (six) hours as needed. pain    Historical Provider, MD  albuterol (PROVENTIL HFA;VENTOLIN HFA) 108 (90 BASE) MCG/ACT inhaler Inhale 2 puffs into the lungs every 4 (four) hours as needed for wheezing or shortness of breath. 10/06/13   Rexene Alberts, MD  albuterol (PROVENTIL HFA;VENTOLIN HFA) 108 (90 BASE) MCG/ACT inhaler Inhale 1-2 puffs into the lungs every 6 (six) hours as needed for wheezing or shortness of breath. 11/09/14   Fredia Sorrow, MD  aspirin-sod bicarb-citric acid (ALKA-SELTZER) 325 MG TBEF tablet Take 325 mg by mouth daily as needed (for pain).    Historical Provider, MD  HYDROcodone-acetaminophen (NORCO/VICODIN) 5-325 MG per tablet Take 1-2 tablets by mouth every 6 (six) hours as needed. 11/09/14   Fredia Sorrow, MD  tiotropium (SPIRIVA HANDIHALER) 18 MCG inhalation capsule Place 1 capsule (18 mcg total) into inhaler and inhale daily. 11/09/14   Fredia Sorrow, MD  tiotropium (  SPIRIVA) 18 MCG inhalation capsule Place 1 capsule (18 mcg total) into inhaler and inhale daily. 10/06/13   Rexene Alberts, MD   BP 107/66 mmHg  Pulse 92  Temp(Src) 101.9 F (38.8 C) (Rectal)  Resp 17  SpO2 93%   Filed Vitals:   11/10/14 0201 11/10/14 0204 11/10/14 0224  BP:  116/70   Pulse:  125   Temp:  99.6 F (37.6 C) 101.9 F (38.8 C)  TempSrc:  Oral Rectal  Resp:  28   SpO2: 84% 92%       Physical Exam  0215: Physical examination:  Nursing notes reviewed; Vital signs and O2 SAT reviewed; +febrile.;; Constitutional: Well developed, Well nourished, In no acute distress; Head:  Normocephalic, atraumatic; Eyes: EOMI, PERRL, No scleral icterus; ENMT: TM's clear bilat. +edemetous nasal turbinates bilat with clear rhinorrhea. Mouth and pharynx without lesions. No tonsillar exudates. No intra-oral edema. No submandibular or sublingual edema. No hoarse voice, no drooling, no stridor. No pain with manipulation of larynx. No trismus. Mouth and pharynx normal, Mucous membranes dry; Neck: Supple, Full range of motion, No lymphadenopathy; Cardiovascular: Tachycardic rate and rhythm, No gallop; Respiratory: Breath sounds diminished & equal bilaterally, faint scattered wheezes. No audible wheezing. Speaking full sentences with ease, Normal respiratory effort/excursion; Chest: Nontender, Movement normal; Abdomen: Soft, Nontender, Nondistended, Normal bowel sounds; Genitourinary: No CVA tenderness; Extremities: Pulses normal, No tenderness, No edema, No calf edema or asymmetry.; Neuro: AA&Ox3, vague historian. Major CN grossly intact.  Speech clear. No gross focal motor or sensory deficits in extremities.; Skin: Color normal, Warm, Dry.   ED Course  Procedures     EKG Interpretation None      MDM  MDM Reviewed: previous chart, nursing note and vitals Reviewed previous: labs, ECG and x-ray Interpretation: labs and CT scan     Results for orders placed or performed during the hospital encounter of 11/10/14  Urinalysis, Routine w reflex microscopic  Result Value Ref Range   Color, Urine YELLOW YELLOW   APPearance CLEAR CLEAR   Specific Gravity, Urine 1.010 1.005 - 1.030   pH 6.0 5.0 - 8.0   Glucose, UA NEGATIVE NEGATIVE mg/dL   Hgb urine dipstick LARGE (A) NEGATIVE   Bilirubin Urine NEGATIVE NEGATIVE   Ketones, ur NEGATIVE NEGATIVE mg/dL   Protein, ur 30 (A) NEGATIVE mg/dL    Urobilinogen, UA 0.2 0.0 - 1.0 mg/dL   Nitrite NEGATIVE NEGATIVE   Leukocytes, UA NEGATIVE NEGATIVE  Influenza panel by pcr  Result Value Ref Range   Influenza A By PCR NEGATIVE NEGATIVE   Influenza B By PCR NEGATIVE NEGATIVE   H1N1 flu by pcr NOT DETECTED NOT DETECTED  Urine microscopic-add on  Result Value Ref Range   Squamous Epithelial / LPF RARE RARE   WBC, UA 0-2 <3 WBC/hpf   RBC / HPF 21-50 <3 RBC/hpf   Bacteria, UA FEW (A) RARE   Dg Chest 2 View 11/09/2014   CLINICAL DATA:  Cough for 2 days, chills, high fall risk, COPD, former smoker  EXAM: CHEST  2 VIEW  COMPARISON:  02/06/2014  FINDINGS: Normal heart size, mediastinal contours and pulmonary vascularity.  Lungs are emphysematous but clear.  Hepatic technique.  No pleural effusion or pneumothorax.  Bones unremarkable.  IMPRESSION: COPD changes.  No acute abnormalities.   Electronically Signed   By: Lavonia Dana M.D.   On: 11/09/2014 17:36   Ct Angio Chest Pe W/cm &/or Wo Cm 11/10/2014   CLINICAL DATA:  Acute onset of chills and  weakness of fever and productive cough. Initial encounter.  EXAM: CT ANGIOGRAPHY CHEST WITH CONTRAST  TECHNIQUE: Multidetector CT imaging of the chest was performed using the standard protocol during bolus administration of intravenous contrast. Multiplanar CT image reconstructions and MIPs were obtained to evaluate the vascular anatomy.  CONTRAST:  131mL OMNIPAQUE IOHEXOL 350 MG/ML SOLN  COMPARISON:  Chest radiograph performed 11/09/2014, and CTA of the chest performed 10/05/2013  FINDINGS: There is no evidence of significant pulmonary embolus. Evaluation for pulmonary embolus is mildly suboptimal due to motion artifact.  There is patchy airspace consolidation within the right lower lobe, at the left lung base, and at the right middle lobe. Right middle lobe opacities are somewhat nodular in appearance, with question of minimal central cavitation. This raises suspicion for aspiration pneumonia. Underlying septic  emboli cannot be entirely excluded, given the appearance at the right middle lobe, but are considered less likely.  There is no evidence of pleural effusion or pneumothorax. Underlying emphysematous change is noted, more prominent at the upper lung lobes.  Scattered coronary artery calcifications are seen. The mediastinum is otherwise unremarkable. No mediastinal lymphadenopathy is seen. No pericardial effusion is identified.  There is hazy diffuse soft tissue density about the inferior aspect of the right hilum, measuring approximately 4.0 x 3.0 cm, without a definite mass. There is associated focal narrowing of the bronchioles. Given that this finding is new since 2014, further evaluation would be helpful. It would likely be amenable to transbronchial biopsy.  The great vessels are grossly unremarkable appearance. No axillary lymphadenopathy is seen. The visualized portions of the thyroid gland are unremarkable in appearance.  The visualized portions of the liver and spleen are unremarkable.  No acute osseous abnormalities are seen.  Review of the MIP images confirms the above findings.  IMPRESSION: 1. No evidence of significant pulmonary embolus. 2. Patchy airspace consolidation within the right lower lobe, at the left lung base, and at the right middle lobe. Right middle lobe opacities are somewhat nodular in appearance, with question of minimal central cavitation. Findings are suspicious for aspiration pneumonia. Underlying septic emboli cannot be entirely excluded, given the appearance at the right middle lobe, but are considered less likely. 3. Underlying emphysematous change, more prominent at the upper lung lobes. 4. Hazy soft tissue density about the inferior aspect of the right hilum, measuring 4.0 x 3.0 cm, without a definite mass. There is associated focal narrowing of the bronchioles. Since this finding is new from 2014, further evaluation is recommended. This would likely be amenable to transbronchial  biopsy.   Electronically Signed   By: Garald Balding M.D.   On: 11/10/2014 04:13    0515:   Multilobar pneumonia on CT scan, no PE; will tx with IV abx.  Pt already took tylenol at home; unclear what the dose was though. Pt's O2 Sats increased from 84% R/A to 95% after neb and now on O2 3L N/C. Dx and testing d/w pt.  Questions answered.  Verb understanding, agreeable to admit.  T/C to [facility] Dr. Darrick Meigs, case discussed, including:  HPI, pertinent PM/SHx, VS/PE, dx testing, ED course and treatment:  Agreeable to admit, requests to write temporary orders, obtain tele bed to team APAdmits.   Francine Graven, DO 11/12/14 [phone]

## 2014-11-10 NOTE — Progress Notes (Signed)
ANTIBIOTIC CONSULT NOTE - FOLLOW UP  Pharmacy Consult for Vancomycin and Primaxin Indication: pneumonia  Allergies  Allergen Reactions  . Penicillins Hives and Itching   Patient Measurements: Height: 6' (182.9 cm) Weight: 260 lb (117.935 kg) IBW/kg (Calculated) : 77.6  Vital Signs: Temp: 98.4 F (36.9 C) (01/16 0629) Temp Source: Oral (01/16 0629) BP: 122/74 mmHg (01/16 0629) Pulse Rate: 94 (01/16 0629) Intake/Output from previous day:   Intake/Output from this shift: Total I/O In: 360 [P.O.:360] Out: 1200 [Urine:1200]  Labs:  Recent Labs  11/09/14 1639 11/10/14 0653  WBC 16.0* 11.9*  HGB 13.7 12.7*  PLT 220 181  CREATININE 0.99  --    Estimated Creatinine Clearance: 97.3 mL/min (by C-G formula based on Cr of 0.99). No results for input(s): VANCOTROUGH, VANCOPEAK, VANCORANDOM, GENTTROUGH, GENTPEAK, GENTRANDOM, TOBRATROUGH, TOBRAPEAK, TOBRARND, AMIKACINPEAK, AMIKACINTROU, AMIKACIN in the last 72 hours.   Microbiology: No results found for this or any previous visit (from the past 720 hour(s)).  Anti-infectives    Start     Dose/Rate Route Frequency Ordered Stop   11/10/14 0800  imipenem-cilastatin (PRIMAXIN) 500 mg in sodium chloride 0.9 % 100 mL IVPB     500 mg200 mL/hr over 30 Minutes Intravenous Every 6 hours 11/10/14 0701     11/10/14 0645  vancomycin (VANCOCIN) 2,500 mg in sodium chloride 0.9 % 500 mL IVPB     2,500 mg250 mL/hr over 120 Minutes Intravenous  Once 11/10/14 0637     11/10/14 0430  levofloxacin (LEVAQUIN) IVPB 750 mg     750 mg100 mL/hr over 90 Minutes Intravenous  Once 11/10/14 0420 11/10/14 0603     Assessment: Okay for Protocol, community-acquired pneumonia. He has COPD.  Potential aspiration Obesity/Normalized CrCl dosing protocol will be initiate with an estimated normalized CrCl = 74 ml/min.   Goal of Therapy:  Vancomycin trough level 15-20 mcg/ml  Plan:  Vancomycin 750mg  IV every 12 hours. Primaxin 500mg  IV every 6 hours. Measure  antibiotic drug levels at steady state Follow up culture results  Pricilla Larsson 11/10/2014,12:12 PM

## 2014-11-10 NOTE — H&P (Signed)
PCP:   No primary care provider on file.   Chief Complaint:  Shortness of breath  HPI: 68 year old male who   has a past medical history of COPD (chronic obstructive pulmonary disease). who came to the ED yesterday with chief complaint of chills. He also has been complaining of fever, cough with yellow white colored phlegm, chest x-ray was negative for pneumonia. Lactic acid was normal and patient was discharged on albuterol inhaler. Patient came back around 2 AM as he could not get his medications and he was not feeling well. Patient was hypoxic when he came to the ED with O2 sats 84% on room air. CT angiogram of the chest was done which was negative for pulmonary embolism but shows multifocal pneumonia and possible right lower lobe lung mass. Patient also has white count of 16,000. He denies chest pain, admits to shortness of breath on exertion, subjective fever and chills. He denies nausea vomiting or diarrhea.  Allergies:   Allergies  Allergen Reactions  . Penicillins Hives and Itching      Past Medical History  Diagnosis Date  . COPD (chronic obstructive pulmonary disease)     Emphysema radiographically    Past Surgical History  Procedure Laterality Date  . Artery repair      sinus    Prior to Admission medications   Medication Sig Start Date End Date Taking? Authorizing Provider  acetaminophen (TYLENOL) 500 MG tablet Take 500 mg by mouth every 6 (six) hours as needed. pain    Historical Provider, MD  albuterol (PROVENTIL HFA;VENTOLIN HFA) 108 (90 BASE) MCG/ACT inhaler Inhale 2 puffs into the lungs every 4 (four) hours as needed for wheezing or shortness of breath. 10/06/13   Rexene Alberts, MD  albuterol (PROVENTIL HFA;VENTOLIN HFA) 108 (90 BASE) MCG/ACT inhaler Inhale 1-2 puffs into the lungs every 6 (six) hours as needed for wheezing or shortness of breath. 11/09/14   Fredia Sorrow, MD  aspirin-sod bicarb-citric acid (ALKA-SELTZER) 325 MG TBEF tablet Take 325 mg by  mouth daily as needed (for pain).    Historical Provider, MD  HYDROcodone-acetaminophen (NORCO/VICODIN) 5-325 MG per tablet Take 1-2 tablets by mouth every 6 (six) hours as needed. 11/09/14   Fredia Sorrow, MD  tiotropium (SPIRIVA HANDIHALER) 18 MCG inhalation capsule Place 1 capsule (18 mcg total) into inhaler and inhale daily. 11/09/14   Fredia Sorrow, MD  tiotropium (SPIRIVA) 18 MCG inhalation capsule Place 1 capsule (18 mcg total) into inhaler and inhale daily. 10/06/13   Rexene Alberts, MD    Social History:  reports that he has quit smoking. His smoking use included Cigarettes. He has a 120 pack-year smoking history. He has never used smokeless tobacco. He reports that he does not drink alcohol or use illicit drugs.  Family History  Problem Relation Age of Onset  . Heart failure Mother   . Stroke Father      All the positives are listed in BOLD  Review of Systems:  HEENT: Headache, blurred vision, runny nose, sore throat Neck: Hypothyroidism, hyperthyroidism,,lymphadenopathy Chest : Shortness of breath, history of COPD, Asthma Heart : Chest pain, history of coronary arterey disease GI:  Nausea, vomiting, diarrhea, constipation, GERD GU: Dysuria, urgency, frequency of urination, hematuria Neuro: Stroke, seizures, syncope Psych: Depression, anxiety, hallucinations   Physical Exam: Blood pressure 107/66, pulse 92, temperature 101.9 F (38.8 C), temperature source Rectal, resp. rate 17, SpO2 93 %. Constitutional:   Patient is a well-developed and well-nourished male in no acute distress and cooperative with  exam. Head: Normocephalic and atraumatic Mouth: Mucus membranes moist Eyes: PERRL, EOMI, conjunctivae normal Neck: Supple, No Thyromegaly Cardiovascular: RRR, S1 normal, S2 normal Pulmonary/Chest: Decreased breath sounds bilaterally Abdominal: Soft. Non-tender, non-distended, bowel sounds are normal, no masses, organomegaly, or guarding present.  Neurological: A&O x3,  Strength is normal and symmetric bilaterally, cranial nerve II-XII are grossly intact, no focal motor deficit, sensory intact to light touch bilaterally.  Extremities : No Cyanosis, Clubbing , trace edema  Labs on Admission:  Basic Metabolic Panel:  Recent Labs Lab 11/09/14 1639  NA 138  K 3.5  CL 104  CO2 27  GLUCOSE 109*  BUN 16  CREATININE 0.99  CALCIUM 8.9   Liver Function Tests: No results for input(s): AST, ALT, ALKPHOS, BILITOT, PROT, ALBUMIN in the last 168 hours. No results for input(s): LIPASE, AMYLASE in the last 168 hours. No results for input(s): AMMONIA in the last 168 hours. CBC:  Recent Labs Lab 11/09/14 1639  WBC 16.0*  NEUTROABS 13.0*  HGB 13.7  HCT 43.6  MCV 95.4  PLT 220   Cardiac Enzymes: No results for input(s): CKTOTAL, CKMB, CKMBINDEX, TROPONINI in the last 168 hours.  BNP (last 3 results) No results for input(s): PROBNP in the last 8760 hours. CBG: No results for input(s): GLUCAP in the last 168 hours.  Radiological Exams on Admission: Dg Chest 2 View  11/09/2014   CLINICAL DATA:  Cough for 2 days, chills, high fall risk, COPD, former smoker  EXAM: CHEST  2 VIEW  COMPARISON:  02/06/2014  FINDINGS: Normal heart size, mediastinal contours and pulmonary vascularity.  Lungs are emphysematous but clear.  Hepatic technique.  No pleural effusion or pneumothorax.  Bones unremarkable.  IMPRESSION: COPD changes.  No acute abnormalities.   Electronically Signed   By: Lavonia Dana M.D.   On: 11/09/2014 17:36   Ct Angio Chest Pe W/cm &/or Wo Cm  11/10/2014   CLINICAL DATA:  Acute onset of chills and weakness of fever and productive cough. Initial encounter.  EXAM: CT ANGIOGRAPHY CHEST WITH CONTRAST  TECHNIQUE: Multidetector CT imaging of the chest was performed using the standard protocol during bolus administration of intravenous contrast. Multiplanar CT image reconstructions and MIPs were obtained to evaluate the vascular anatomy.  CONTRAST:  159mL  OMNIPAQUE IOHEXOL 350 MG/ML SOLN  COMPARISON:  Chest radiograph performed 11/09/2014, and CTA of the chest performed 10/05/2013  FINDINGS: There is no evidence of significant pulmonary embolus. Evaluation for pulmonary embolus is mildly suboptimal due to motion artifact.  There is patchy airspace consolidation within the right lower lobe, at the left lung base, and at the right middle lobe. Right middle lobe opacities are somewhat nodular in appearance, with question of minimal central cavitation. This raises suspicion for aspiration pneumonia. Underlying septic emboli cannot be entirely excluded, given the appearance at the right middle lobe, but are considered less likely.  There is no evidence of pleural effusion or pneumothorax. Underlying emphysematous change is noted, more prominent at the upper lung lobes.  Scattered coronary artery calcifications are seen. The mediastinum is otherwise unremarkable. No mediastinal lymphadenopathy is seen. No pericardial effusion is identified.  There is hazy diffuse soft tissue density about the inferior aspect of the right hilum, measuring approximately 4.0 x 3.0 cm, without a definite mass. There is associated focal narrowing of the bronchioles. Given that this finding is new since 2014, further evaluation would be helpful. It would likely be amenable to transbronchial biopsy.  The great vessels are grossly unremarkable appearance.  No axillary lymphadenopathy is seen. The visualized portions of the thyroid gland are unremarkable in appearance.  The visualized portions of the liver and spleen are unremarkable.  No acute osseous abnormalities are seen.  Review of the MIP images confirms the above findings.  IMPRESSION: 1. No evidence of significant pulmonary embolus. 2. Patchy airspace consolidation within the right lower lobe, at the left lung base, and at the right middle lobe. Right middle lobe opacities are somewhat nodular in appearance, with question of minimal central  cavitation. Findings are suspicious for aspiration pneumonia. Underlying septic emboli cannot be entirely excluded, given the appearance at the right middle lobe, but are considered less likely. 3. Underlying emphysematous change, more prominent at the upper lung lobes. 4. Hazy soft tissue density about the inferior aspect of the right hilum, measuring 4.0 x 3.0 cm, without a definite mass. There is associated focal narrowing of the bronchioles. Since this finding is new from 2014, further evaluation is recommended. This would likely be amenable to transbronchial biopsy.   Electronically Signed   By: Garald Balding M.D.   On: 11/10/2014 04:13    EKG: Independently reviewed. Sinus tachycardia   Assessment/Plan Active Problems:   Pulmonary emphysema   SOB (shortness of breath)   CAP (community acquired pneumonia)   Pneumonia   ? Lung mass  Multifocal pneumonia Patient is presenting with multi lobar pneumonia with patchy airspace consolidation in the right lower lobe, left lung base and right middle lobe. Patient will be started on vancomycin and Zosyn as there is a concern for possible aspiration pneumonia. We'll also obtain swallow evaluation to make sure the patient is not aspirating.  ? Right lower lobe lung mass  there is a hazy soft tissue density in the right hilum measuring 4 x 3 cm without definite mass on the CTA. Will get pulmonary consultation for further recommendations.  COPD Patient does not seem to be in COPD exacerbation, will continue with DuoNeb nebulizers every 6 hours. He is not wheezing, will not give IV steroids at this time.  ? Dysphagia There is a question of possible dysphagia, as the CTA raise the possibility of aspiration pneumonia. Will obtain swallow evaluation.   DVT prophylaxis Lovenox   Code status: Full code  Family discussion: No family at bedside   Time Spent on Admission: 38 minutes  LAMA,GAGAN S [facility] Pager: [phone] 11/10/2014,  5:38 AM  If 7PM-7AM, please contact night-coverage  [email or web address]  Password [email or web address]

## 2014-11-10 NOTE — Progress Notes (Signed)
Patient admitted to the hospital earlier this morning by Dr. Darrick Meigs.  Patient seen and examined.  He has been admitted to the hospital with progressive shortness of breath and cough. Imaging indicated developing pneumonia and possible lung mass. He was seen by pulmonology, appreciate Dr. Luan Pulling input. As will be for further outpatient evaluation of lung mass once he has recovered from his pneumonia. Would continue current antibiotic regimen. He has a history of COPD, but does not appear to be wheezing at this time. Continue current treatments. Will try and ambulate the patient and wean off oxygen as tolerated.  [name],Darren Burke

## 2014-11-10 NOTE — Progress Notes (Signed)
RT instructed patient on flutter valve, patient tolerated well

## 2014-11-10 NOTE — Consult Note (Signed)
Consult requested by: [facility] Consult requested for abnormal chest CT:  HPI: This is a 69 year old who came to the emergency department with increasing shortness of breath cough congestion and low-grade fever. He had been to the emergency department earlier on the day of admission and was treated and released. However he had more trouble when he got home. He underwent CT of the chest which did not show pulmonary emboli but did show pneumonia and also shows a rather ill-defined density in the right lung. He says he feels better. He has less congestion but he is still short of breath with exertion. He has a very long smoking history.  Past Medical History  Diagnosis Date  . COPD (chronic obstructive pulmonary disease)     Emphysema radiographically     Family History  Problem Relation Age of Onset  . Heart failure Mother   . Stroke Father      History   Social History  . Marital Status: Divorced    Spouse Name: N/A    Number of Children: N/A  . Years of Education: N/A   Social History Main Topics  . Smoking status: Former Smoker -- 3.00 packs/day for 40 years    Types: Cigarettes  . Smokeless tobacco: Never Used  . Alcohol Use: No  . Drug Use: No  . Sexual Activity: None   Other Topics Concern  . None   Social History Narrative     ROS: No hemoptysis or chest pain. He has not had chills. No swelling of his legs. No abdominal pain. Otherwise per the history and physical    Objective: Vital signs in last 24 hours: Temp:  [98.4 F (36.9 C)-101.9 F (38.8 C)] 98.4 F (36.9 C) (01/16 0629) Pulse Rate:  [92-126] 94 (01/16 0629) Resp:  [14-28] 22 (01/16 0629) BP: (99-123)/(54-85) 122/74 mmHg (01/16 0629) SpO2:  [84 %-95 %] 93 % (01/16 0738) Weight:  [108.863 kg (240 lb)-117.935 kg (260 lb)] 117.935 kg (260 lb) (01/16 0629) Weight change:     Intake/Output from previous day:    PHYSICAL EXAM He is awake and alert. He looks fairly comfortable. His HEENT  exam is unremarkable. Nose and throat are clear. His neck is supple. His chest shows some rhonchi and end-expiratory wheezes bilaterally. His heart is regular without gallop. His abdomen is soft with no masses. Extremities showed no clubbing cyanosis or edema. Central nervous system exam is grossly intact  Lab Results: Basic Metabolic Panel:  Recent Labs  11/09/14 1639  NA 138  K 3.5  CL 104  CO2 27  GLUCOSE 109*  BUN 16  CREATININE 0.99  CALCIUM 8.9   Liver Function Tests: No results for input(s): AST, ALT, ALKPHOS, BILITOT, PROT, ALBUMIN in the last 72 hours. No results for input(s): LIPASE, AMYLASE in the last 72 hours. No results for input(s): AMMONIA in the last 72 hours. CBC:  Recent Labs  11/09/14 1639 11/10/14 0653  WBC 16.0* 11.9*  NEUTROABS 13.0*  --   HGB 13.7 12.7*  HCT 43.6 38.4*  MCV 95.4 96.2  PLT 220 181   Cardiac Enzymes: No results for input(s): CKTOTAL, CKMB, CKMBINDEX, TROPONINI in the last 72 hours. BNP: No results for input(s): PROBNP in the last 72 hours. D-Dimer: No results for input(s): DDIMER in the last 72 hours. CBG: No results for input(s): GLUCAP in the last 72 hours. Hemoglobin A1C: No results for input(s): HGBA1C in the last 72 hours. Fasting Lipid Panel: No results for input(s): CHOL, HDL,  LDLCALC, TRIG, CHOLHDL, LDLDIRECT in the last 72 hours. Thyroid Function Tests: No results for input(s): TSH, T4TOTAL, FREET4, T3FREE, THYROIDAB in the last 72 hours. Anemia Panel: No results for input(s): VITAMINB12, FOLATE, FERRITIN, TIBC, IRON, RETICCTPCT in the last 72 hours. Coagulation: No results for input(s): LABPROT, INR in the last 72 hours. Urine Drug Screen: Drugs of Abuse  No results found for: LABOPIA, COCAINSCRNUR, LABBENZ, AMPHETMU, THCU, LABBARB  Alcohol Level: No results for input(s): ETH in the last 72 hours. Urinalysis:  Recent Labs  11/10/14 0400  COLORURINE YELLOW  LABSPEC 1.010  PHURINE 6.0  GLUCOSEU NEGATIVE   HGBUR LARGE*  BILIRUBINUR NEGATIVE  KETONESUR NEGATIVE  PROTEINUR 30*  UROBILINOGEN 0.2  NITRITE NEGATIVE  LEUKOCYTESUR NEGATIVE   Misc. Labs:   ABGS: No results for input(s): PHART, PO2ART, TCO2, HCO3 in the last 72 hours.  Invalid input(s): PCO2   MICROBIOLOGY: No results found for this or any previous visit (from the past 240 hour(s)).  Studies/Results: Dg Chest 2 View  11/09/2014   CLINICAL DATA:  Cough for 2 days, chills, high fall risk, COPD, former smoker  EXAM: CHEST  2 VIEW  COMPARISON:  02/06/2014  FINDINGS: Normal heart size, mediastinal contours and pulmonary vascularity.  Lungs are emphysematous but clear.  Hepatic technique.  No pleural effusion or pneumothorax.  Bones unremarkable.  IMPRESSION: COPD changes.  No acute abnormalities.   Electronically Signed   By: Lavonia Dana M.D.   On: 11/09/2014 17:36   Ct Angio Chest Pe W/cm &/or Wo Cm  11/10/2014   CLINICAL DATA:  Acute onset of chills and weakness of fever and productive cough. Initial encounter.  EXAM: CT ANGIOGRAPHY CHEST WITH CONTRAST  TECHNIQUE: Multidetector CT imaging of the chest was performed using the standard protocol during bolus administration of intravenous contrast. Multiplanar CT image reconstructions and MIPs were obtained to evaluate the vascular anatomy.  CONTRAST:  131mL OMNIPAQUE IOHEXOL 350 MG/ML SOLN  COMPARISON:  Chest radiograph performed 11/09/2014, and CTA of the chest performed 10/05/2013  FINDINGS: There is no evidence of significant pulmonary embolus. Evaluation for pulmonary embolus is mildly suboptimal due to motion artifact.  There is patchy airspace consolidation within the right lower lobe, at the left lung base, and at the right middle lobe. Right middle lobe opacities are somewhat nodular in appearance, with question of minimal central cavitation. This raises suspicion for aspiration pneumonia. Underlying septic emboli cannot be entirely excluded, given the appearance at the right  middle lobe, but are considered less likely.  There is no evidence of pleural effusion or pneumothorax. Underlying emphysematous change is noted, more prominent at the upper lung lobes.  Scattered coronary artery calcifications are seen. The mediastinum is otherwise unremarkable. No mediastinal lymphadenopathy is seen. No pericardial effusion is identified.  There is hazy diffuse soft tissue density about the inferior aspect of the right hilum, measuring approximately 4.0 x 3.0 cm, without a definite mass. There is associated focal narrowing of the bronchioles. Given that this finding is new since 2014, further evaluation would be helpful. It would likely be amenable to transbronchial biopsy.  The great vessels are grossly unremarkable appearance. No axillary lymphadenopathy is seen. The visualized portions of the thyroid gland are unremarkable in appearance.  The visualized portions of the liver and spleen are unremarkable.  No acute osseous abnormalities are seen.  Review of the MIP images confirms the above findings.  IMPRESSION: 1. No evidence of significant pulmonary embolus. 2. Patchy airspace consolidation within the right lower lobe,  at the left lung base, and at the right middle lobe. Right middle lobe opacities are somewhat nodular in appearance, with question of minimal central cavitation. Findings are suspicious for aspiration pneumonia. Underlying septic emboli cannot be entirely excluded, given the appearance at the right middle lobe, but are considered less likely. 3. Underlying emphysematous change, more prominent at the upper lung lobes. 4. Hazy soft tissue density about the inferior aspect of the right hilum, measuring 4.0 x 3.0 cm, without a definite mass. There is associated focal narrowing of the bronchioles. Since this finding is new from 2014, further evaluation is recommended. This would likely be amenable to transbronchial biopsy.   Electronically Signed   By: Garald Balding M.D.   On:  11/10/2014 04:13    Medications:  Prior to Admission:  Prescriptions prior to admission  Medication Sig Dispense Refill Last Dose  . acetaminophen (TYLENOL) 500 MG tablet Take 500 mg by mouth every 6 (six) hours as needed. pain   11/09/2014 at Unknown time  . albuterol (PROVENTIL HFA;VENTOLIN HFA) 108 (90 BASE) MCG/ACT inhaler Inhale 1-2 puffs into the lungs every 6 (six) hours as needed for wheezing or shortness of breath. 1 Inhaler 1 Past Week at Unknown time  . aspirin-sod bicarb-citric acid (ALKA-SELTZER) 325 MG TBEF tablet Take 325 mg by mouth daily as needed (for pain).   Past Week at Unknown time  . tiotropium (SPIRIVA HANDIHALER) 18 MCG inhalation capsule Place 1 capsule (18 mcg total) into inhaler and inhale daily. 30 capsule 1 11/09/2014 at Unknown time  . HYDROcodone-acetaminophen (NORCO/VICODIN) 5-325 MG per tablet Take 1-2 tablets by mouth every 6 (six) hours as needed. 10 tablet 0 unknown   Scheduled: . enoxaparin (LOVENOX) injection  40 mg Subcutaneous Q24H  . imipenem-cilastatin  500 mg Intravenous Q6H  . ipratropium-albuterol  3 mL Nebulization Q4H  . vancomycin  2,500 mg Intravenous Once   Continuous: . sodium chloride Stopped (11/10/14 0603)   [id number], HYDROcodone-acetaminophen  Assesment: He has community-acquired pneumonia. He has COPD. He has what may be a mass lesion in his lung. We discussed that and I told him that he would need to have some sort of workup after he is over his pneumonia. We discussed the possibility of bronchoscopy with transbronchial biopsy. He is not at all certain that he wants to go through any sort of workup. Active Problems:   Pulmonary emphysema   SOB (shortness of breath)   CAP (community acquired pneumonia)   Pneumonia    Plan: Continue current treatments. He could be worked up for the mass lesion as an outpatient if he agrees    LOS: 0 days   Burleigh Brockmann L 11/10/2014, 10:22 AM

## 2014-11-10 NOTE — ED Notes (Signed)
Verbal order from Dr. Thurnell Garbe for pt. To be transported off tele.

## 2014-11-10 NOTE — ED Notes (Signed)
AC paged for Levaquin

## 2014-11-10 NOTE — ED Notes (Addendum)
Pt was seen here earlier and was diagnosed with a viral illness. Pt was told to come back if he got worse. Pt states he is beginning to have head congestion. Pt took tylenol 1.5 ago. Pts 02 was 84% on RA. Pt placed on 2L o2 and now o2 is 95%

## 2014-11-10 NOTE — Progress Notes (Addendum)
ANTIBIOTIC CONSULT NOTE-Preliminary  Pharmacy Consult for vancomycin Indication: pneumonia  Allergies  Allergen Reactions  . Penicillins Hives and Itching    Patient Measurements: Height: 6' (182.9 cm) Weight: 260 lb (117.935 kg) IBW/kg (Calculated) : 77.6  Vital Signs: Temp: 98.4 F (36.9 C) (01/16 0629) Temp Source: Oral (01/16 0629) BP: 122/74 mmHg (01/16 0629) Pulse Rate: 94 (01/16 0629)  Labs:  Recent Labs  11/09/14 1639  WBC 16.0*  HGB 13.7  PLT 220  CREATININE 0.99    Estimated Creatinine Clearance: 97.3 mL/min (by C-G formula based on Cr of 0.99).  No results for input(s): VANCOTROUGH, VANCOPEAK, VANCORANDOM, GENTTROUGH, GENTPEAK, GENTRANDOM, TOBRATROUGH, TOBRAPEAK, TOBRARND, AMIKACINPEAK, AMIKACINTROU, AMIKACIN in the last 72 hours.   Microbiology: No results found for this or any previous visit (from the past 720 hour(s)).  Medical History: Past Medical History  Diagnosis Date  . COPD (chronic obstructive pulmonary disease)     Emphysema radiographically    Medications:  Scheduled:  . albuterol  2.5 mg Nebulization Q4H  . albuterol  2.5 mg Nebulization Q6H  . enoxaparin (LOVENOX) injection  40 mg Subcutaneous Q24H  . ipratropium  0.5 mg Nebulization Q6H  . vancomycin  2,500 mg Intravenous Once    Assessment: 69 yo male with hx COPD. CXR showed multifocal pneumonia and possible right lower lobe lung mass. Will give vanc for PNA  Goal of Therapy:  Vancomycin trough level 15-20 mcg/ml  Plan:  Preliminary review of pertinent patient information completed.  Protocol will be initiated with a one-time dose(s) of vancomycin 2500 mg IV.  Forestine Na clinical pharmacist will complete review during morning rounds to assess patient and finalize treatment regimen.  Spoke with Dr. Darrick Meigs regarding PCN allergy and decided to treat PNA with vanc and Primaxin.    Twilla Khouri Scarlett, RPH 11/10/2014,6:50 AM

## 2014-11-10 NOTE — ED Notes (Signed)
EDP at bedside  

## 2014-11-11 LAB — COMPREHENSIVE METABOLIC PANEL WITH GFR
ALT: 20 U/L (ref 0–53)
AST: 16 U/L (ref 0–37)
Albumin: 2.6 g/dL — ABNORMAL LOW (ref 3.5–5.2)
Alkaline Phosphatase: 34 U/L — ABNORMAL LOW (ref 39–117)
Anion gap: 6 (ref 5–15)
BUN: 17 mg/dL (ref 6–23)
CO2: 26 mmol/L (ref 19–32)
Calcium: 8 mg/dL — ABNORMAL LOW (ref 8.4–10.5)
Chloride: 106 meq/L (ref 96–112)
Creatinine, Ser: 1.02 mg/dL (ref 0.50–1.35)
GFR calc Af Amer: 86 mL/min — ABNORMAL LOW
GFR calc non Af Amer: 75 mL/min — ABNORMAL LOW
Glucose, Bld: 94 mg/dL (ref 70–99)
Potassium: 3.7 mmol/L (ref 3.5–5.1)
Sodium: 138 mmol/L (ref 135–145)
Total Bilirubin: 0.5 mg/dL (ref 0.3–1.2)
Total Protein: 6.3 g/dL (ref 6.0–8.3)

## 2014-11-11 LAB — CBC
HCT: 35.4 % — ABNORMAL LOW (ref 39.0–52.0)
Hemoglobin: 11.7 g/dL — ABNORMAL LOW (ref 13.0–17.0)
MCH: 32 pg (ref 26.0–34.0)
MCHC: 33.1 g/dL (ref 30.0–36.0)
MCV: 96.7 fL (ref 78.0–100.0)
Platelets: 167 10*3/uL (ref 150–400)
RBC: 3.66 MIL/uL — ABNORMAL LOW (ref 4.22–5.81)
RDW: 13.3 % (ref 11.5–15.5)
WBC: 8 10*3/uL (ref 4.0–10.5)

## 2014-11-11 NOTE — Plan of Care (Signed)
Problem: Phase I Progression Outcomes Goal: OOB as tolerated unless otherwise ordered Outcome: Progressing Patient ambulating in room.

## 2014-11-11 NOTE — Evaluation (Signed)
Clinical/Bedside Swallow Evaluation Patient Details  Name: Darren Burke MRN: [id number] DOB: 11/17/1945  Today's Date: 11/11/2014 Time:9:59 AM  - 10:22 AM    Past Medical History:  Past Medical History  Diagnosis Date  . COPD (chronic obstructive pulmonary disease)     Emphysema radiographically   Past Surgical History:  Past Surgical History  Procedure Laterality Date  . Artery repair      sinus   HPI:  This is a 69 year old who came to the emergency department with increasing shortness of breath cough congestion and low-grade fever. He had been to the emergency department earlier on the day of admission and was treated and released. However he had more trouble when he got home. He underwent CT of the chest which did not show pulmonary emboli but did show pneumonia and also shows a rather ill-defined density in the right lung. He says he feels better. He has less congestion but he is still short of breath with exertion. He has a very long smoking history.   Assessment/Recommendations/Treatment Plan    SLP Assessment Clinical Impression Statement: Darren Burke was seen at bedside for clinical swallow evaluation. Oral motor examination is unremarkable except for edentulous status. Pt wears U/L dentures, but only has upper dentures at hospital. Pt shows no overt signs or symptoms of aspiration at bedside. Pt did require liquid wash with saltine cracker due to lack of dentition. Pt denies history of difficulty swallowing and reports this as his first time with PNA. Continue diet as ordered, regular/thin with standard aspiration and reflux precautions. No further SLP services indicated at this time.  Risk for Aspiration: None  Swallow Evaluation Recommendations Diet Recommendations: Regular, Thin liquid Liquid Administration via: Cup, Straw Medication Administration: Whole meds with liquid Supervision: Patient able to self feed Postural Changes and/or Swallow Maneuvers: Seated upright  90 degrees, Upright 30-60 min after meal Oral Care Recommendations: Oral care BID, Patient independent with oral care Other Recommendations: Clarify dietary restrictions Follow up Recommendations: None  Treatment Plan Treatment Plan Recommendations: No treatment recommended at this time     Individuals Consulted Consulted and Agree with Results and Recommendations: Patient, RN  Swallowing Goals   N/A  Swallow Study Prior Functional Status   Independent; regular diet, U/L dentures  General  Date of Onset: 11/10/14 HPI: This is a 69 year old who came to the emergency department with increasing shortness of breath cough congestion and low-grade fever. He had been to the emergency department earlier on the day of admission and was treated and released. However he had more trouble when he got home. He underwent CT of the chest which did not show pulmonary emboli but did show pneumonia and also shows a rather ill-defined density in the right lung. He says he feels better. He has less congestion but he is still short of breath with exertion. He has a very long smoking history. Type of Study: Bedside swallow evaluation Previous Swallow Assessment: None on record Diet Prior to this Study: Regular, Thin liquids Temperature Spikes Noted: Yes Respiratory Status: Room air History of Recent Intubation: No Behavior/Cognition: Cooperative, Alert, Pleasant mood Oral Cavity - Dentition: Edentulous, Dentures, top Self-Feeding Abilities: Able to feed self Patient Positioning: Upright in bed Baseline Vocal Quality: Clear, Hoarse Volitional Cough: Strong Volitional Swallow: Able to elicit  Oral Motor/Sensory Function  Overall Oral Motor/Sensory Function: Appears within functional limits for tasks assessed  Consistency Results  Ice Chips Ice chips: Within functional limits Presentation: Spoon  Thin Liquid Thin Liquid: Within functional  limits Presentation: Cup;Self Fed;Straw  Nectar Thick  Liquid Nectar Thick Liquid: Not tested  Honey Thick Liquid Honey Thick Liquid: Not tested  Puree Puree: Within functional limits Presentation: Spoon  Solid Solid: Within functional limits Presentation: Self Fed Other Comments:  (required liquid wash due to lack of dentures )  Thank you,  Genene Churn, [place] 11/11/2014,10:37 AM

## 2014-11-11 NOTE — Progress Notes (Signed)
He says he feels significantly better but doesn't feel like he is ready to go home quite yet. He is coughing and congested.  He still has temperature to 100.1. Pulse is 95, respirations 20 blood pressure 133/69 and O2 sat 93%. His white blood count which started at 16,000 is now down to 8000. His chest is clearer than before but he still has some rhonchi. His heart is regular. He looks fairly comfortable.  He has multilobar pneumonia. He says he feels much better. He has an ill-defined density in the right hilar area that will need further investigation. This could be done with repeat CT in about 6 weeks once he is over the pneumonia he did have bronchoscopy with transbronchial biopsy. He is not at this point sure what he wants to do.  I agree from a pulmonary point of view he is probably not quite ready for discharge but he is remarkably improved from yesterday

## 2014-11-12 LAB — BASIC METABOLIC PANEL
ANION GAP: 6 (ref 5–15)
BUN: 13 mg/dL (ref 6–23)
CO2: 28 mmol/L (ref 19–32)
CREATININE: 0.99 mg/dL (ref 0.50–1.35)
Calcium: 8.2 mg/dL — ABNORMAL LOW (ref 8.4–10.5)
Chloride: 105 mEq/L (ref 96–112)
GFR calc Af Amer: >90 mL/min (ref >90)
GFR calc non Af Amer: 83 mL/min — ABNORMAL LOW (ref >90)
Glucose, Bld: 97 mg/dL (ref 70–99)
Potassium: 3.6 mmol/L (ref 3.5–5.1)
Sodium: 139 mmol/L (ref 135–145)

## 2014-11-12 LAB — URINE CULTURE: Colony Count: 2000

## 2014-11-12 MED ORDER — DOCUSATE SODIUM 100 MG PO CAPS
100.0000 mg | ORAL_CAPSULE | Freq: Every day | ORAL | Status: DC | PRN
  Administered 2014-11-12: 100 mg via ORAL
  Filled 2014-11-12: qty 1

## 2014-11-12 MED ORDER — METHYLPREDNISOLONE SODIUM SUCC 40 MG IJ SOLR
40.0000 mg | Freq: Two times a day (BID) | INTRAMUSCULAR | Status: DC
  Administered 2014-11-12 – 2014-11-13 (×3): 40 mg via INTRAVENOUS
  Filled 2014-11-12 (×3): qty 1

## 2014-11-12 NOTE — Care Management Utilization Note (Signed)
UR completed 

## 2014-11-12 NOTE — Progress Notes (Signed)
Subjective: Patient was admitted 2 days ago due to pneumonia. He is on combinations of antibiotics. He is still congested, coughing and chest feels tight. Patient stopped tobacco about 6 years ago after smoking for more than 40 years.  Objective: Vital signs in last 24 hours: Temp:  [98.6 F (37 C)-98.9 F (37.2 C)] 98.6 F (37 C) (01/18 0654) Pulse Rate:  [75-87] 75 (01/18 0654) Resp:  [20-21] 21 (01/18 0654) BP: (106-139)/(64-83) 139/83 mmHg (01/18 0654) SpO2:  [95 %-99 %] 95 % (01/18 0654) Weight change:     Intake/Output from previous day: 01/17 0701 - 01/18 0700 In: 2052.5 [P.O.:840; I.V.:862.5; IV Piggyback:350] Out: 2423 [Urine:3175]  PHYSICAL EXAM General appearance: alert and no distress Resp: diminished breath sounds bilaterally, rhonchi bilaterally and wheezes bilaterally Cardio: S1, S2 normal GI: soft, non-tender; bowel sounds normal; no masses,  no organomegaly Extremities: extremities normal, atraumatic, no cyanosis or edema  Lab Results:  Results for orders placed or performed during the hospital encounter of 11/10/14 (from the past 48 hour(s))  CBC     Status: Abnormal   Collection Time: 11/11/14  6:04 AM  Result Value Ref Range   WBC 8.0 4.0 - 10.5 K/uL   RBC 3.66 (L) 4.22 - 5.81 MIL/uL   Hemoglobin 11.7 (L) 13.0 - 17.0 g/dL   HCT 35.4 (L) 39.0 - 52.0 %   MCV 96.7 78.0 - 100.0 fL   MCH 32.0 26.0 - 34.0 pg   MCHC 33.1 30.0 - 36.0 g/dL   RDW 13.3 11.5 - 15.5 %   Platelets 167 150 - 400 K/uL  Comprehensive metabolic panel     Status: Abnormal   Collection Time: 11/11/14  6:04 AM  Result Value Ref Range   Sodium 138 135 - 145 mmol/L    Comment: Please note change in reference range.   Potassium 3.7 3.5 - 5.1 mmol/L    Comment: Please note change in reference range.   Chloride 106 96 - 112 mEq/L   CO2 26 19 - 32 mmol/L   Glucose, Bld 94 70 - 99 mg/dL   BUN 17 6 - 23 mg/dL   Creatinine, Ser 1.02 0.50 - 1.35 mg/dL   Calcium 8.0 (L) 8.4 - 10.5 mg/dL   Total Protein 6.3 6.0 - 8.3 g/dL   Albumin 2.6 (L) 3.5 - 5.2 g/dL   AST 16 0 - 37 U/L   ALT 20 0 - 53 U/L   Alkaline Phosphatase 34 (L) 39 - 117 U/L   Total Bilirubin 0.5 0.3 - 1.2 mg/dL   GFR calc non Af Amer 75 (L) >90 mL/min   GFR calc Af Amer 86 (L) >90 mL/min    Comment: (NOTE) The eGFR has been calculated using the CKD EPI equation. This calculation has not been validated in all clinical situations. eGFR's persistently <90 mL/min signify possible Chronic Kidney Disease.    Anion gap 6 5 - 15  Basic metabolic panel     Status: Abnormal   Collection Time: 11/12/14  6:27 AM  Result Value Ref Range   Sodium 139 135 - 145 mmol/L    Comment: Please note change in reference range.   Potassium 3.6 3.5 - 5.1 mmol/L    Comment: Please note change in reference range.   Chloride 105 96 - 112 mEq/L   CO2 28 19 - 32 mmol/L   Glucose, Bld 97 70 - 99 mg/dL   BUN 13 6 - 23 mg/dL   Creatinine, Ser 0.99 0.50 - 1.35  mg/dL   Calcium 8.2 (L) 8.4 - 10.5 mg/dL   GFR calc non Af Amer 83 (L) >90 mL/min   GFR calc Af Amer >90 >90 mL/min    Comment: (NOTE) The eGFR has been calculated using the CKD EPI equation. This calculation has not been validated in all clinical situations. eGFR's persistently <90 mL/min signify possible Chronic Kidney Disease.    Anion gap 6 5 - 15    ABGS No results for input(s): PHART, PO2ART, TCO2, HCO3 in the last 72 hours.  Invalid input(s): PCO2 CULTURES No results found for this or any previous visit (from the past 240 hour(s)). Studies/Results: No results found.  Medications: I have reviewed the patient's current medications.  Assesment:   Active Problems:   Pulmonary emphysema   SOB (shortness of breath)   CAP (community acquired pneumonia)   Pneumonia   Hypoxia   Acute on chronic respiratory failure with hypoxia ?COPD   Plan:  Medications reviewed Will continue combinations antibiotics Continue nebulizer treatment Pulmonary consult  appreciated.    LOS: 2 days   Thompson Mckim 11/12/2014, 8:08 AM

## 2014-11-12 NOTE — Progress Notes (Signed)
Subjective: He says he feels better. He still has some congestion.  Objective: Vital signs in last 24 hours: Temp:  [98.6 F (37 C)-98.9 F (37.2 C)] 98.6 F (37 C) (01/18 0654) Pulse Rate:  [75-87] 75 (01/18 0654) Resp:  [20-21] 21 (01/18 0654) BP: (106-139)/(64-83) 139/83 mmHg (01/18 0654) SpO2:  [94 %-99 %] 94 % (01/18 0840) Weight change:     Intake/Output from previous day: 01/17 0701 - 01/18 0700 In: 2052.5 [P.O.:840; I.V.:862.5; IV Piggyback:350] Out: 7494 [Urine:3175]  PHYSICAL EXAM General appearance: alert, cooperative and no distress Resp: rhonchi bilaterally Cardio: regular rate and rhythm, S1, S2 normal, no murmur, click, rub or gallop GI: soft, non-tender; bowel sounds normal; no masses,  no organomegaly Extremities: extremities normal, atraumatic, no cyanosis or edema  Lab Results:  Results for orders placed or performed during the hospital encounter of 11/10/14 (from the past 48 hour(s))  CBC     Status: Abnormal   Collection Time: 11/11/14  6:04 AM  Result Value Ref Range   WBC 8.0 4.0 - 10.5 K/uL   RBC 3.66 (L) 4.22 - 5.81 MIL/uL   Hemoglobin 11.7 (L) 13.0 - 17.0 g/dL   HCT 35.4 (L) 39.0 - 52.0 %   MCV 96.7 78.0 - 100.0 fL   MCH 32.0 26.0 - 34.0 pg   MCHC 33.1 30.0 - 36.0 g/dL   RDW 13.3 11.5 - 15.5 %   Platelets 167 150 - 400 K/uL  Comprehensive metabolic panel     Status: Abnormal   Collection Time: 11/11/14  6:04 AM  Result Value Ref Range   Sodium 138 135 - 145 mmol/L    Comment: Please note change in reference range.   Potassium 3.7 3.5 - 5.1 mmol/L    Comment: Please note change in reference range.   Chloride 106 96 - 112 mEq/L   CO2 26 19 - 32 mmol/L   Glucose, Bld 94 70 - 99 mg/dL   BUN 17 6 - 23 mg/dL   Creatinine, Ser 1.02 0.50 - 1.35 mg/dL   Calcium 8.0 (L) 8.4 - 10.5 mg/dL   Total Protein 6.3 6.0 - 8.3 g/dL   Albumin 2.6 (L) 3.5 - 5.2 g/dL   AST 16 0 - 37 U/L   ALT 20 0 - 53 U/L   Alkaline Phosphatase 34 (L) 39 - 117 U/L   Total Bilirubin 0.5 0.3 - 1.2 mg/dL   GFR calc non Af Amer 75 (L) >90 mL/min   GFR calc Af Amer 86 (L) >90 mL/min    Comment: (NOTE) The eGFR has been calculated using the CKD EPI equation. This calculation has not been validated in all clinical situations. eGFR's persistently <90 mL/min signify possible Chronic Kidney Disease.    Anion gap 6 5 - 15  Basic metabolic panel     Status: Abnormal   Collection Time: 11/12/14  6:27 AM  Result Value Ref Range   Sodium 139 135 - 145 mmol/L    Comment: Please note change in reference range.   Potassium 3.6 3.5 - 5.1 mmol/L    Comment: Please note change in reference range.   Chloride 105 96 - 112 mEq/L   CO2 28 19 - 32 mmol/L   Glucose, Bld 97 70 - 99 mg/dL   BUN 13 6 - 23 mg/dL   Creatinine, Ser 0.99 0.50 - 1.35 mg/dL   Calcium 8.2 (L) 8.4 - 10.5 mg/dL   GFR calc non Af Amer 83 (L) >90 mL/min   GFR  calc Af Amer >90 >90 mL/min    Comment: (NOTE) The eGFR has been calculated using the CKD EPI equation. This calculation has not been validated in all clinical situations. eGFR's persistently <90 mL/min signify possible Chronic Kidney Disease.    Anion gap 6 5 - 15    ABGS No results for input(s): PHART, PO2ART, TCO2, HCO3 in the last 72 hours.  Invalid input(s): PCO2 CULTURES No results found for this or any previous visit (from the past 240 hour(s)). Studies/Results: No results found.  Medications:  Prior to Admission:  Prescriptions prior to admission  Medication Sig Dispense Refill Last Dose  . acetaminophen (TYLENOL) 500 MG tablet Take 500 mg by mouth every 6 (six) hours as needed. pain   11/09/2014 at Unknown time  . albuterol (PROVENTIL HFA;VENTOLIN HFA) 108 (90 BASE) MCG/ACT inhaler Inhale 1-2 puffs into the lungs every 6 (six) hours as needed for wheezing or shortness of breath. 1 Inhaler 1 Past Week at Unknown time  . aspirin-sod bicarb-citric acid (ALKA-SELTZER) 325 MG TBEF tablet Take 325 mg by mouth daily as needed (for  pain).   Past Week at Unknown time  . tiotropium (SPIRIVA HANDIHALER) 18 MCG inhalation capsule Place 1 capsule (18 mcg total) into inhaler and inhale daily. 30 capsule 1 11/09/2014 at Unknown time  . HYDROcodone-acetaminophen (NORCO/VICODIN) 5-325 MG per tablet Take 1-2 tablets by mouth every 6 (six) hours as needed. 10 tablet 0 unknown   Scheduled: . enoxaparin (LOVENOX) injection  40 mg Subcutaneous Q24H  . guaiFENesin  1,200 mg Oral BID  . imipenem-cilastatin  500 mg Intravenous Q6H  . ipratropium-albuterol  3 mL Nebulization Q6H  . methylPREDNISolone (SOLU-MEDROL) injection  40 mg Intravenous Q12H  . vancomycin  750 mg Intravenous Q12H   Continuous: . sodium chloride 75 mL/hr (11/11/14 1714)   DYJ:[id number], HYDROcodone-acetaminophen  Assesment: He was admitted with community-acquired pneumonia. He also has what seems to be a lung mass. He has COPD at baseline. He is improving but still has congestion and he is still somewhat tight in his chest. Active Problems:   Pulmonary emphysema   SOB (shortness of breath)   CAP (community acquired pneumonia)   Pneumonia   Hypoxia   Acute on chronic respiratory failure with hypoxia    Plan: I will start him on IV steroids. Continue antibiotics.    LOS: 2 days   Gildardo Tickner L 11/12/2014, 8:52 AM

## 2014-11-12 NOTE — Care Management Note (Addendum)
    Page 1 of 1   11/13/2014     9:42:03 AM CARE MANAGEMENT NOTE 11/13/2014  Patient:  Darren Burke, Darren Burke   Account Number:  [id number]  Date Initiated:  11/12/2014  Documentation initiated by:  Jolene Provost  Subjective/Objective Assessment:   Pt admitted with CAP. Pt is from home, lives alone and independent at baseline. Pt has no HH services, DME's or med needs prior to admission. Pt plans to discharge home with self care. May need home O2 assessment closer to discharge.     Action/Plan:   Will continue to follow for CM needs.   Anticipated DC Date:  11/14/2014   Anticipated DC Plan:  [place]  CM consult      Choice offered to / List presented to:             Status of service:  In process, will continue to follow Medicare Important Message given?  YES (If response is "NO", the following Medicare IM given date fields will be blank) Date Medicare IM given:  11/13/2014 Medicare IM given by:  Theophilus Kinds Date Additional Medicare IM given:   Additional Medicare IM given by:    Discharge Disposition:  HOME/SELF CARE  Per UR Regulation:    If discussed at Long Length of Stay Meetings, dates discussed:    Comments:  11/13/14 0935 Christinia Gully, RN BSN CM Pt discharged home today. Pt questioned CM about help at home for cleaning. CM explained to pt the CAP process. Informed pt that insurance does not pay for that kind of service. Pt verbalized understanding. No DME needs noted. Pt and pts nurse aware of discharge arrangements.  11/12/2014 [place], RN, MSN, [facility]

## 2014-11-13 LAB — BASIC METABOLIC PANEL
Anion gap: 9 (ref 5–15)
BUN: 16 mg/dL (ref 6–23)
CO2: 25 mmol/L (ref 19–32)
Calcium: 8.9 mg/dL (ref 8.4–10.5)
Chloride: 107 mEq/L (ref 96–112)
Creatinine, Ser: 0.9 mg/dL (ref 0.50–1.35)
GFR calc Af Amer: >90 mL/min (ref >90)
GFR calc non Af Amer: 87 mL/min — ABNORMAL LOW (ref >90)
Glucose, Bld: 130 mg/dL — ABNORMAL HIGH (ref 70–99)
Potassium: 3.7 mmol/L (ref 3.5–5.1)
SODIUM: 141 mmol/L (ref 135–145)

## 2014-11-13 LAB — CBC
HCT: 40.1 % (ref 39.0–52.0)
HEMOGLOBIN: 13 g/dL (ref 13.0–17.0)
MCH: 30.7 pg (ref 26.0–34.0)
MCHC: 32.4 g/dL (ref 30.0–36.0)
MCV: 94.6 fL (ref 78.0–100.0)
Platelets: 264 10*3/uL (ref 150–400)
RBC: 4.24 MIL/uL (ref 4.22–5.81)
RDW: 13.2 % (ref 11.5–15.5)
WBC: 8.3 10*3/uL (ref 4.0–10.5)

## 2014-11-13 MED ORDER — AMOXICILLIN-POT CLAVULANATE 500-125 MG PO TABS: 1.0000 | ORAL_TABLET | Freq: Three times a day (TID) | ORAL | Status: DC

## 2014-11-13 MED ORDER — PREDNISONE (PAK) 10 MG PO TABS: ORAL_TABLET | Freq: Every day | ORAL | Status: DC

## 2014-11-13 NOTE — Progress Notes (Signed)
He is being discharged. I would plan to do a follow-up CT in about 6 weeks. If he decides earlier than that that he wants workup he would need to have transbronchial biopsy done in [facility]

## 2014-11-13 NOTE — Care Management Utilization Note (Signed)
UR completed 

## 2014-11-13 NOTE — Progress Notes (Signed)
Patient was discharged home today.  Patient was given discharge instructions, prescriptions, and care notes.  Patient verbalized understanding with no complaints or concerns voiced at this time.  IV was removed with catheter intact, no bleeding or complications.  Patient left unit in stable condition by staff member in wheelchair.

## 2014-11-13 NOTE — Discharge Summary (Signed)
Physician Discharge Summary  Patient ID: Darren Burke MRN: [id number] DOB/AGE: Dec 31, 1945 69 y.o. Primary Care Physician:Raimundo Corbit, MD Admit date: 11/10/2014 Discharge date: 11/13/2014    Discharge Diagnoses:   Active Problems:   Pulmonary emphysema   SOB (shortness of breath)   CAP (community acquired pneumonia)   Pneumonia   Hypoxia   Acute on chronic respiratory failure with hypoxia     Medication List    TAKE these medications        acetaminophen 500 MG tablet  Commonly known as:  TYLENOL  Take 500 mg by mouth every 6 (six) hours as needed. pain     albuterol 108 (90 BASE) MCG/ACT inhaler  Commonly known as:  PROVENTIL HFA;VENTOLIN HFA  Inhale 1-2 puffs into the lungs every 6 (six) hours as needed for wheezing or shortness of breath.     amoxicillin-clavulanate 500-125 MG per tablet  Commonly known as:  AUGMENTIN  Take 1 tablet (500 mg total) by mouth 3 (three) times daily.     aspirin-sod bicarb-citric acid 325 MG Tbef tablet  Commonly known as:  ALKA-SELTZER  Take 325 mg by mouth daily as needed (for pain).     HYDROcodone-acetaminophen 5-325 MG per tablet  Commonly known as:  NORCO/VICODIN  Take 1-2 tablets by mouth every 6 (six) hours as needed.     predniSONE 10 MG tablet  Commonly known as:  STERAPRED UNI-PAK  Take by mouth daily. 4 tab po daily for 3 days, 3 tab po daily for 3 days, 2 tab po daily for 3 days, 1 tab po daily for 3 days     tiotropium 18 MCG inhalation capsule  Commonly known as:  SPIRIVA HANDIHALER  Place 1 capsule (18 mcg total) into inhaler and inhale daily.        Discharged Condition: improved    Consults: pulmonary  Significant Diagnostic Studies: Dg Chest 2 View  11/09/2014   CLINICAL DATA:  Cough for 2 days, chills, high fall risk, COPD, former smoker  EXAM: CHEST  2 VIEW  COMPARISON:  02/06/2014  FINDINGS: Normal heart size, mediastinal contours and pulmonary vascularity.  Lungs are emphysematous but clear.   Hepatic technique.  No pleural effusion or pneumothorax.  Bones unremarkable.  IMPRESSION: COPD changes.  No acute abnormalities.   Electronically Signed   By: Lavonia Dana M.D.   On: 11/09/2014 17:36   Ct Angio Chest Pe W/cm &/or Wo Cm  11/10/2014   CLINICAL DATA:  Acute onset of chills and weakness of fever and productive cough. Initial encounter.  EXAM: CT ANGIOGRAPHY CHEST WITH CONTRAST  TECHNIQUE: Multidetector CT imaging of the chest was performed using the standard protocol during bolus administration of intravenous contrast. Multiplanar CT image reconstructions and MIPs were obtained to evaluate the vascular anatomy.  CONTRAST:  131mL OMNIPAQUE IOHEXOL 350 MG/ML SOLN  COMPARISON:  Chest radiograph performed 11/09/2014, and CTA of the chest performed 10/05/2013  FINDINGS: There is no evidence of significant pulmonary embolus. Evaluation for pulmonary embolus is mildly suboptimal due to motion artifact.  There is patchy airspace consolidation within the right lower lobe, at the left lung base, and at the right middle lobe. Right middle lobe opacities are somewhat nodular in appearance, with question of minimal central cavitation. This raises suspicion for aspiration pneumonia. Underlying septic emboli cannot be entirely excluded, given the appearance at the right middle lobe, but are considered less likely.  There is no evidence of pleural effusion or pneumothorax. Underlying emphysematous change is noted, more  prominent at the upper lung lobes.  Scattered coronary artery calcifications are seen. The mediastinum is otherwise unremarkable. No mediastinal lymphadenopathy is seen. No pericardial effusion is identified.  There is hazy diffuse soft tissue density about the inferior aspect of the right hilum, measuring approximately 4.0 x 3.0 cm, without a definite mass. There is associated focal narrowing of the bronchioles. Given that this finding is new since 2014, further evaluation would be helpful. It would  likely be amenable to transbronchial biopsy.  The great vessels are grossly unremarkable appearance. No axillary lymphadenopathy is seen. The visualized portions of the thyroid gland are unremarkable in appearance.  The visualized portions of the liver and spleen are unremarkable.  No acute osseous abnormalities are seen.  Review of the MIP images confirms the above findings.  IMPRESSION: 1. No evidence of significant pulmonary embolus. 2. Patchy airspace consolidation within the right lower lobe, at the left lung base, and at the right middle lobe. Right middle lobe opacities are somewhat nodular in appearance, with question of minimal central cavitation. Findings are suspicious for aspiration pneumonia. Underlying septic emboli cannot be entirely excluded, given the appearance at the right middle lobe, but are considered less likely. 3. Underlying emphysematous change, more prominent at the upper lung lobes. 4. Hazy soft tissue density about the inferior aspect of the right hilum, measuring 4.0 x 3.0 cm, without a definite mass. There is associated focal narrowing of the bronchioles. Since this finding is new from 2014, further evaluation is recommended. This would likely be amenable to transbronchial biopsy.   Electronically Signed   By: Garald Balding M.D.   On: 11/10/2014 04:13    Lab Results: Basic Metabolic Panel:  Recent Labs  11/12/14 0627 11/13/14 0617  NA 139 141  K 3.6 3.7  CL 105 107  CO2 28 25  GLUCOSE 97 130*  BUN 13 16  CREATININE 0.99 0.90  CALCIUM 8.2* 8.9   Liver Function Tests:  Recent Labs  11/11/14 0604  AST 16  ALT 20  ALKPHOS 34*  BILITOT 0.5  PROT 6.3  ALBUMIN 2.6*     CBC:  Recent Labs  11/11/14 0604 11/13/14 0617  WBC 8.0 8.3  HGB 11.7* 13.0  HCT 35.4* 40.1  MCV 96.7 94.6  PLT 167 264    Recent Results (from the past 240 hour(s))  Urine culture     Status: None   Collection Time: 11/10/14  4:00 AM  Result Value Ref Range Status   Specimen  Description URINE, CLEAN CATCH  Final   Special Requests NONE  Final   Colony Count   Final    2,000 COLONIES/ML Performed at [facility]    Culture   Final    INSIGNIFICANT GROWTH Performed at [facility]    Report Status 11/12/2014 FINAL  Final     Hospital Course:   This is a 68 years old male with history of COPD admitted due to shortness of breath. He was found to have pneumonia and COPD exacerbations. He was treated with Iv antibiotics and IV steroid. Patient improved and being discharge home in stable condition. He will be followed in the office in 1 week duration.  Discharge Exam: Blood pressure 138/85, pulse 94, temperature 97.9 F (36.6 C), temperature source Oral, resp. rate 20, height 6' (1.829 m), weight 117.935 kg (260 lb), SpO2 92 %.   Disposition:  home        Follow-up Information    Follow up with Kylia Grajales,  MD In 1 week.   Specialty:  Internal Medicine   Contact information:   [address] [place] [address] [phone]       Signed: Rosita Fire   11/13/2014, 8:24 AM

## 2015-04-01 IMAGING — CT CT ANGIO CHEST
2 of 6 series · 6 of 36 positions shown · IV contrast (Omnipaque 300)
Comparison: Chest radiograph performed earlier today at [DATE] a.m.

CLINICAL DATA: Tachycardia and shortness of breath.

EXAM:
CT ANGIOGRAPHY CHEST WITH CONTRAST
TECHNIQUE: Multidetector CT imaging of the chest was performed using the
standard protocol during bolus administration of intravenous
contrast. Multiplanar CT image reconstructions including MIPs were
obtained to evaluate the vascular anatomy.
CONTRAST:  100mL OMNIPAQUE IOHEXOL 350 MG/ML SOLN

[Series 4: pe 3.0 b40f · axial · 0.73mm/px · z∈[-343,-151]mm · 5 of 98 slices shown]
[im 17/98  lung]
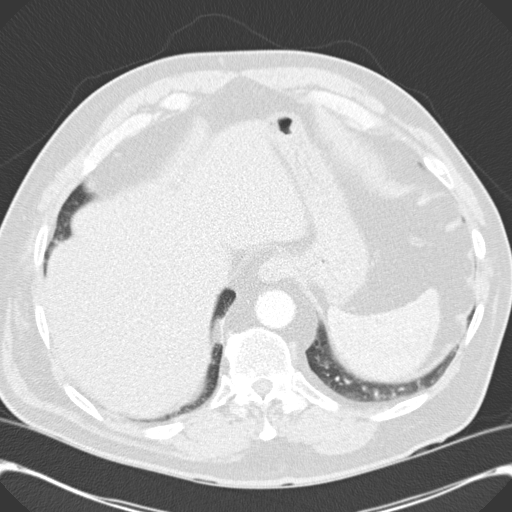
[im 33/98  mediastinal]
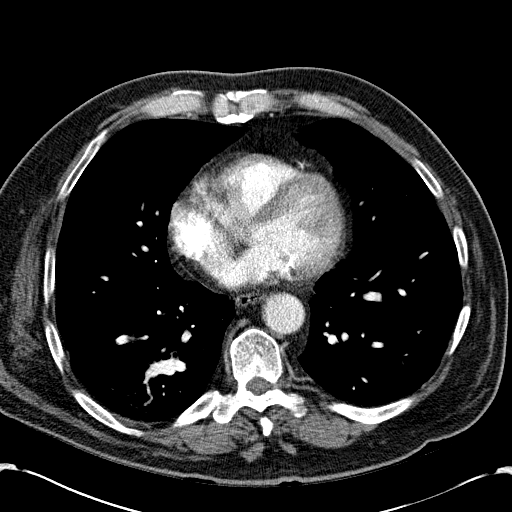
[im 49/98  lung]
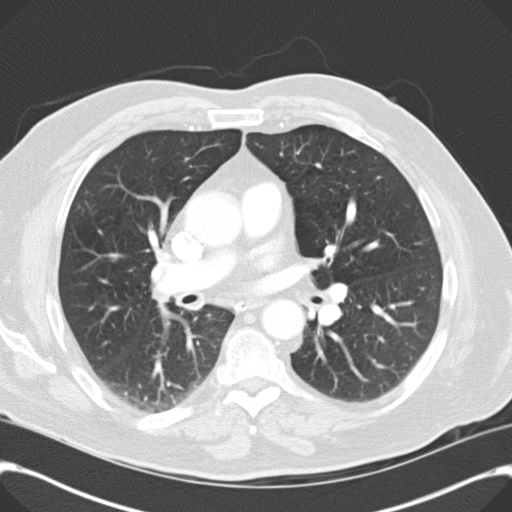
[im 65/98  mediastinal]
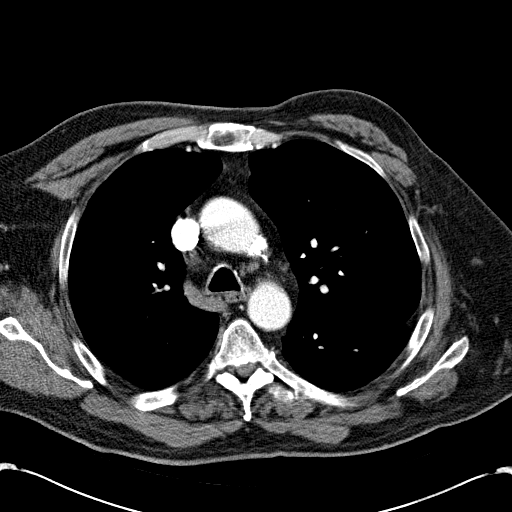
[im 81/98  lung]
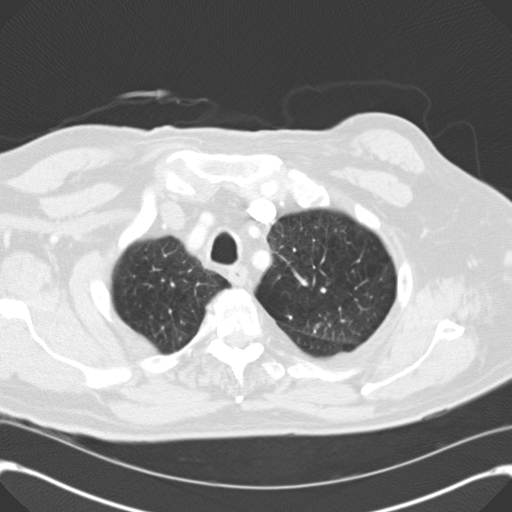

[Series 6: mpr coronal pe 3mm · coronal · 0.59mm/px · 1 of 103 slices shown]
[im 52/103  mediastinal]
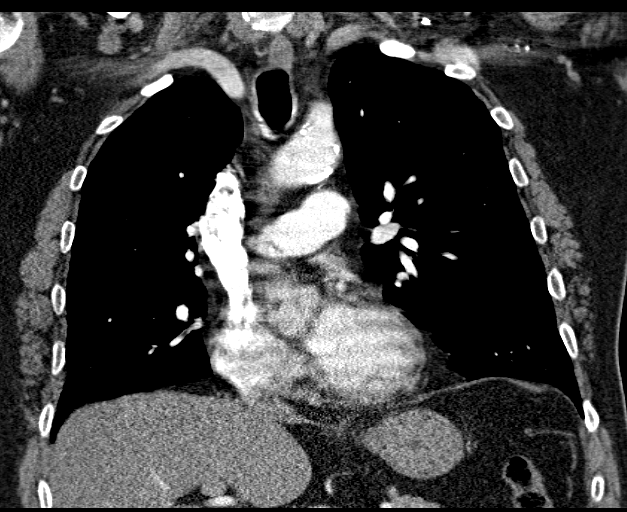

[6 of 36 positions shown; findings below may reference images not displayed]

FINDINGS: There is no evidence of pulmonary embolus.

Mild right basilar airspace opacity may reflect atelectasis or
possibly mild pneumonia. A small lymph node is noted along the right
minor fissure. Emphysema is noted at the upper lung lobes. The left
lung is otherwise clear. There is no evidence of pleural effusion or
pneumothorax. No masses are identified; no abnormal focal contrast
enhancement is seen.

Diffuse coronary artery calcifications are seen. The mediastinum is
otherwise unremarkable in appearance. No mediastinal lymphadenopathy
is seen. No pericardial effusion is identified. Scattered calcific
atherosclerotic disease is noted along the aortic arch and proximal
great vessels; the proximal great vessels are otherwise
unremarkable. No axillary lymphadenopathy is seen. The visualized
portions of the thyroid gland are unremarkable in appearance.

The visualized portions of the liver and spleen are unremarkable.

No acute osseous abnormalities are seen.

Review of the MIP images confirms the above findings.
IMPRESSION: 1. No evidence of pulmonary embolus.
2. Mild right basilar airspace opacity may reflect atelectasis or
possibly mild pneumonia.
3. Emphysema at the upper lung lobes.
4. Diffuse coronary artery calcifications seen.

## 2016-01-05 ENCOUNTER — Emergency Department (HOSPITAL_COMMUNITY)
Admission: EM | Admit: 2016-01-05 | Discharge: 2016-01-06 | Disposition: A | Discharge status: Home / Self Care | Payer: Medicare Other | Attending: Emergency Medicine | Admitting: Emergency Medicine

## 2016-01-05 ENCOUNTER — Encounter (HOSPITAL_COMMUNITY): Payer: Self-pay | Admitting: *Deleted

## 2016-01-05 DIAGNOSIS — Z79899 Other long term (current) drug therapy: Secondary | ICD-10-CM | POA: Diagnosis not present

## 2016-01-05 DIAGNOSIS — Z87891 Personal history of nicotine dependence: Secondary | ICD-10-CM | POA: Diagnosis not present

## 2016-01-05 DIAGNOSIS — R1011 Right upper quadrant pain: Secondary | ICD-10-CM | POA: Diagnosis not present

## 2016-01-05 DIAGNOSIS — R109 Unspecified abdominal pain: Secondary | ICD-10-CM | POA: Insufficient documentation

## 2016-01-05 DIAGNOSIS — Z7982 Long term (current) use of aspirin: Secondary | ICD-10-CM | POA: Insufficient documentation

## 2016-01-05 DIAGNOSIS — J449 Chronic obstructive pulmonary disease, unspecified: Secondary | ICD-10-CM | POA: Diagnosis not present

## 2016-01-05 DIAGNOSIS — R319 Hematuria, unspecified: Secondary | ICD-10-CM | POA: Diagnosis not present

## 2016-01-05 LAB — CBC WITH DIFFERENTIAL/PLATELET
BASOS ABS: 0.1 10*3/uL (ref 0.0–0.1)
BASOS PCT: 1 %
EOS ABS: 0.1 10*3/uL (ref 0.0–0.7)
Eosinophils Relative: 2 %
HCT: 36 % — ABNORMAL LOW (ref 39.0–52.0)
HEMOGLOBIN: 11.8 g/dL — AB (ref 13.0–17.0)
Lymphocytes Relative: 38 %
Lymphs Abs: 3.2 10*3/uL (ref 0.7–4.0)
MCH: 30.9 pg (ref 26.0–34.0)
MCHC: 32.8 g/dL (ref 30.0–36.0)
MCV: 94.2 fL (ref 78.0–100.0)
Monocytes Absolute: 0.8 10*3/uL (ref 0.1–1.0)
Monocytes Relative: 10 %
NEUTROS ABS: 4.1 10*3/uL (ref 1.7–7.7)
Neutrophils Relative %: 49 %
Platelets: 239 10*3/uL (ref 150–400)
RBC: 3.82 MIL/uL — AB (ref 4.22–5.81)
RDW: 12.5 % (ref 11.5–15.5)
WBC: 8.3 10*3/uL (ref 4.0–10.5)

## 2016-01-05 MED ORDER — IPRATROPIUM BROMIDE 0.02 % IN SOLN
0.5000 mg | Freq: Once | RESPIRATORY_TRACT | Status: AC
  Administered 2016-01-05: 0.5 mg via RESPIRATORY_TRACT
  Filled 2016-01-05: qty 2.5

## 2016-01-05 MED ORDER — ALBUTEROL SULFATE (2.5 MG/3ML) 0.083% IN NEBU
5.0000 mg | INHALATION_SOLUTION | Freq: Once | RESPIRATORY_TRACT | Status: AC
  Administered 2016-01-05: 5 mg via RESPIRATORY_TRACT
  Filled 2016-01-05: qty 6

## 2016-01-05 NOTE — ED Notes (Signed)
Pt c/o abd pain and distention intermittently for the past 3 weeks, denies any n/v/d, last bowel movement was day before yesterday,

## 2016-01-05 NOTE — ED Provider Notes (Signed)
CSN: [id number]     Arrival date & time 01/05/16  2255 History  By signing my name below, I, Darren Burke, attest that this documentation has been prepared under the direction and in the presence of Darren Porter, MD at 2325 . Electronically Signed: Rowan Burke, Scribe. 01/05/2016. 11:34 PM.   Chief Complaint  Patient presents with  . Abdominal Pain   The history is provided by the patient. No language interpreter was used.   HPI Comments:  Darren Burke is a 70 y.o. male and PMHx of COPD who presents to the Emergency Department complaining of intermittent episodes of RUQ abdominal pain for the past month, onset ~1hr after eating. He states the episodes last ~15 min. Pt reports associated abdominal bloating and radiation across his abdomen and into the back bilaterally. He notes sandwiches worsen the pain, but it was not worsened by pinto beans. He has eaten ice and cold water with  relief of symptoms. Pt states he feels like he is currently breathing normally. Pt denies h/o abdominal surgeries, current tobacco use, or use of oxygen at home. Pt denies nausea, vomiting, diarrhea, fever, or chills.  PCP none  Past Medical History  Diagnosis Date  . COPD (chronic obstructive pulmonary disease) (HCC)     Emphysema radiographically   Past Surgical History  Procedure Laterality Date  . Artery repair      sinus   Family History  Problem Relation Age of Onset  . Heart failure Mother   . Stroke Father    Social History  Substance Use Topics  . Smoking status: Former Smoker -- 3.00 packs/day for 40 years    Types: Cigarettes  . Smokeless tobacco: Never Used  . Alcohol Use: No  lives at home Lives alone  Review of Systems  Constitutional: Negative for fever and chills.  Gastrointestinal: Positive for abdominal pain and abdominal distention. Negative for nausea, vomiting and diarrhea.  Musculoskeletal: Positive for back pain.  All other systems reviewed and are  negative.  Allergies  Penicillins  Home Medications   Prior to Admission medications   Medication Sig Start Date End Date Taking? Authorizing Provider  acetaminophen (TYLENOL) 500 MG tablet Take 500 mg by mouth every 6 (six) hours as needed. pain   Yes Historical Provider, MD  albuterol (PROVENTIL HFA;VENTOLIN HFA) 108 (90 BASE) MCG/ACT inhaler Inhale 1-2 puffs into the lungs every 6 (six) hours as needed for wheezing or shortness of breath. 11/09/14  Yes Fredia Sorrow, MD  aspirin-sod bicarb-citric acid (ALKA-SELTZER) 325 MG TBEF tablet Take 325 mg by mouth daily as needed (for pain).   Yes Historical Provider, MD  amoxicillin-clavulanate (AUGMENTIN) 500-125 MG per tablet Take 1 tablet (500 mg total) by mouth 3 (three) times daily. 11/13/14   Darren Fire, MD  HYDROcodone-acetaminophen (NORCO/VICODIN) 5-325 MG per tablet Take 1-2 tablets by mouth every 6 (six) hours as needed. 11/09/14   Fredia Sorrow, MD  omeprazole (PRILOSEC) 20 MG capsule Take 1 po BID x 2 weeks then once a day 01/06/16   Darren Porter, MD  predniSONE (STERAPRED UNI-PAK) 10 MG tablet Take by mouth daily. 4 tab po daily for 3 days, 3 tab po daily for 3 days, 2 tab po daily for 3 days, 1 tab po daily for 3 days 11/13/14   Darren Fire, MD  tiotropium (SPIRIVA HANDIHALER) 18 MCG inhalation capsule Place 1 capsule (18 mcg total) into inhaler and inhale daily. 01/06/16   Darren Porter, MD   BP 182/113 mmHg  Pulse  95  Temp(Src) 98.2 F (36.8 C)  Resp 24  Ht 5\' 11"  (1.803 m)  Wt 245 lb (111.131 kg)  BMI 34.19 kg/m2  SpO2 97%  Vital signs normal except for hypertension  Physical Exam  Constitutional: He is oriented to person, place, and time. He appears well-developed and well-nourished.  Non-toxic appearance. He does not appear ill. No distress.  HENT:  Head: Normocephalic and atraumatic.  Right Ear: External ear normal.  Left Ear: External ear normal.  Nose: Nose normal. No mucosal edema or rhinorrhea.  Mouth/Throat:  Oropharynx is clear and moist and mucous membranes are normal. No dental abscesses or uvula swelling.  Eyes: Conjunctivae and EOM are normal. Pupils are equal, round, and reactive to light.  Neck: Normal range of motion and full passive range of motion without pain. Neck supple.  Cardiovascular: Normal rate, regular rhythm and normal heart sounds.  Exam reveals no gallop and no friction rub.   No murmur heard. Pulmonary/Chest: Effort normal. No respiratory distress. He has wheezes. He has no rhonchi. He has no rales. He exhibits no tenderness and no crepitus.  Diffuse wheezing, sometimes auditory  Abdominal: Soft. Normal appearance and bowel sounds are normal. He exhibits no distension. There is no tenderness. There is no rebound and no guarding.  Musculoskeletal: Normal range of motion. He exhibits no edema or tenderness.  Moves all extremities well.   Neurological: He is alert and oriented to person, place, and time. He has normal strength. No cranial nerve deficit.  Skin: Skin is warm, dry and intact. No rash noted. No erythema. No pallor.  Psychiatric: He has a normal mood and affect. His speech is normal and behavior is normal. His mood appears not anxious.  Nursing note and vitals reviewed.  ED Course  Procedures  Medications  albuterol (PROVENTIL) (2.5 MG/3ML) 0.083% nebulizer solution 5 mg (5 mg Nebulization Given 01/05/16 2359)  ipratropium (ATROVENT) nebulizer solution 0.5 mg (0.5 mg Nebulization Given 01/05/16 2359)   DIAGNOSTIC STUDIES:  Oxygen Saturation is 97% on RA, normal by my interpretation.    COORDINATION OF CARE:  11:34 PM Will administer breathing treatment and order blood work. Discussed treatment plan with pt at bedside and pt agreed to plan. Pain meds were not given, he is not having pain at this time.   12:23 AM Re-evaluated pt. Informed pt that blood tests look good. Improved air movement with a few scattered rhonchi. Pt states he is breathing better currently.   We discussed getting a CT scan and he is agreeable.  2:15 AM patient was given his CT results. Patient is wanting me to write him for more Spiriva, he has failed to get a primary care doctor in the past year. I told him I would give it to him this one time however he cannot expect the ED to keep writing for his chronic COPD medication. We discussed coming back for an outpatient right upper quadrant ultrasound to look further for possible gallstones. Meanwhile he can take MiraLAX for possible constipation. He is noted to have a lot of stool on his CT scan.  Results for orders placed or performed during the hospital encounter of 01/05/16  Comprehensive metabolic panel  Result Value Ref Range   Sodium 138 135 - 145 mmol/L   Potassium 3.8 3.5 - 5.1 mmol/L   Chloride 104 101 - 111 mmol/L   CO2 26 22 - 32 mmol/L   Glucose, Bld 90 65 - 99 mg/dL   BUN 28 (H) 6 -  20 mg/dL   Creatinine, Ser 1.86 (H) 0.61 - 1.24 mg/dL   Calcium 8.5 (L) 8.9 - 10.3 mg/dL   Total Protein 7.8 6.5 - 8.1 g/dL   Albumin 3.5 3.5 - 5.0 g/dL   AST 16 15 - 41 U/L   ALT 19 17 - 63 U/L   Alkaline Phosphatase 46 38 - 126 U/L   Total Bilirubin 0.4 0.3 - 1.2 mg/dL   GFR calc non Af Amer 36 (L) >60 mL/min   GFR calc Af Amer 42 (L) >60 mL/min   Anion gap 8 5 - 15  Lipase, blood  Result Value Ref Range   Lipase 36 11 - 51 U/L  CBC with Differential  Result Value Ref Range   WBC 8.3 4.0 - 10.5 K/uL   RBC 3.82 (L) 4.22 - 5.81 MIL/uL   Hemoglobin 11.8 (L) 13.0 - 17.0 g/dL   HCT 36.0 (L) 39.0 - 52.0 %   MCV 94.2 78.0 - 100.0 fL   MCH 30.9 26.0 - 34.0 pg   MCHC 32.8 30.0 - 36.0 g/dL   RDW 12.5 11.5 - 15.5 %   Platelets 239 150 - 400 K/uL   Neutrophils Relative % 49 %   Neutro Abs 4.1 1.7 - 7.7 K/uL   Lymphocytes Relative 38 %   Lymphs Abs 3.2 0.7 - 4.0 K/uL   Monocytes Relative 10 %   Monocytes Absolute 0.8 0.1 - 1.0 K/uL   Eosinophils Relative 2 %   Eosinophils Absolute 0.1 0.0 - 0.7 K/uL   Basophils Relative 1 %    Basophils Absolute 0.1 0.0 - 0.1 K/uL  Urinalysis, Routine w reflex microscopic  Result Value Ref Range   Color, Urine YELLOW YELLOW   APPearance CLEAR CLEAR   Specific Gravity, Urine 1.010 1.005 - 1.030   pH 5.0 5.0 - 8.0   Glucose, UA NEGATIVE NEGATIVE mg/dL   Hgb urine dipstick LARGE (A) NEGATIVE   Bilirubin Urine NEGATIVE NEGATIVE   Ketones, ur NEGATIVE NEGATIVE mg/dL   Protein, ur 100 (A) NEGATIVE mg/dL   Nitrite NEGATIVE NEGATIVE   Leukocytes, UA SMALL (A) NEGATIVE  Troponin I  Result Value Ref Range   Troponin I <0.03 <0.031 ng/mL  Urine microscopic-add on  Result Value Ref Range   Squamous Epithelial / LPF 0-5 (A) NONE SEEN   WBC, UA 6-30 0 - 5 WBC/hpf   RBC / HPF 6-30 0 - 5 RBC/hpf   Bacteria, UA FEW (A) NONE SEEN   Laboratory interpretation all normal except mild anemia  I have personally reviewed and evaluated these images and lab results as part of my medical decision-making.   EKG Interpretation   Date/Time:  Sunday January 05 2016 23:52:14 EDT Ventricular Rate:  90 PR Interval:  160 QRS Duration: 105 QT Interval:  382 QTC Calculation: 467 R Axis:   91 Text Interpretation:  Sinus rhythm Sinus pause Low voltage, extremity  leads Electrode noise Since last tracing rate slower (09 Nov 2014)  Confirmed by [facility]  MD-I, Gerron Guidotti ([id number]) on 01/06/2016 12:18:52 AM      MDM   Final diagnoses:  Abdominal pain, unspecified abdominal location    New Prescriptions   OMEPRAZOLE (PRILOSEC) 20 MG CAPSULE    Take 1 po BID x 2 weeks then once a day   OTC MiraLAX  Plan discharge   Darren Porter, MD, Abram Sander'    I personally performed the services described in this documentation, which was scribed in my presence. The recorded information has  been reviewed and considered.  Get miralax and put one dose or 17 g in 8 ounces of water,  take 1 dose every 30 minutes for 2-3 hours or until you  get good results and then once or twice daily to prevent constipation.     Darren Porter,  MD 01/06/16 [phone]

## 2016-01-06 ENCOUNTER — Ambulatory Visit (HOSPITAL_COMMUNITY)
Admission: RE | Admit: 2016-01-06 | Discharge: 2016-01-06 | Disposition: A | Discharge status: Home / Self Care | Payer: Medicare Other | Source: Ambulatory Visit | Attending: Emergency Medicine | Admitting: Emergency Medicine

## 2016-01-06 ENCOUNTER — Other Ambulatory Visit (HOSPITAL_COMMUNITY): Payer: Medicare Other

## 2016-01-06 ENCOUNTER — Emergency Department (HOSPITAL_COMMUNITY): Payer: Medicare Other

## 2016-01-06 DIAGNOSIS — R1011 Right upper quadrant pain: Secondary | ICD-10-CM | POA: Insufficient documentation

## 2016-01-06 DIAGNOSIS — R109 Unspecified abdominal pain: Secondary | ICD-10-CM | POA: Diagnosis not present

## 2016-01-06 DIAGNOSIS — R9431 Abnormal electrocardiogram [ECG] [EKG]: Secondary | ICD-10-CM

## 2016-01-06 DIAGNOSIS — R319 Hematuria, unspecified: Secondary | ICD-10-CM | POA: Diagnosis not present

## 2016-01-06 LAB — URINE MICROSCOPIC-ADD ON

## 2016-01-06 LAB — COMPREHENSIVE METABOLIC PANEL
ALK PHOS: 46 U/L (ref 38–126)
ALT: 19 U/L (ref 17–63)
ANION GAP: 8 (ref 5–15)
AST: 16 U/L (ref 15–41)
Albumin: 3.5 g/dL (ref 3.5–5.0)
BUN: 28 mg/dL — ABNORMAL HIGH (ref 6–20)
CALCIUM: 8.5 mg/dL — AB (ref 8.9–10.3)
CHLORIDE: 104 mmol/L (ref 101–111)
CO2: 26 mmol/L (ref 22–32)
CREATININE: 1.86 mg/dL — AB (ref 0.61–1.24)
GFR calc Af Amer: 42 mL/min — ABNORMAL LOW (ref >60)
GFR, EST NON AFRICAN AMERICAN: 36 mL/min — AB (ref >60)
Glucose, Bld: 90 mg/dL (ref 65–99)
Potassium: 3.8 mmol/L (ref 3.5–5.1)
Sodium: 138 mmol/L (ref 135–145)
Total Bilirubin: 0.4 mg/dL (ref 0.3–1.2)
Total Protein: 7.8 g/dL (ref 6.5–8.1)

## 2016-01-06 LAB — LIPASE, BLOOD: LIPASE: 36 U/L (ref 11–51)

## 2016-01-06 LAB — URINALYSIS, ROUTINE W REFLEX MICROSCOPIC
BILIRUBIN URINE: NEGATIVE
Glucose, UA: NEGATIVE mg/dL
KETONES UR: NEGATIVE mg/dL
Nitrite: NEGATIVE
PROTEIN: 100 mg/dL — AB
SPECIFIC GRAVITY, URINE: 1.01 (ref 1.005–1.030)
pH: 5 (ref 5.0–8.0)

## 2016-01-06 LAB — TROPONIN I: Troponin I: <0.03 ng/mL (ref <0.031)

## 2016-01-06 MED ORDER — OMEPRAZOLE 20 MG PO CPDR: DELAYED_RELEASE_CAPSULE | ORAL | Status: DC

## 2016-01-06 MED ORDER — TIOTROPIUM BROMIDE MONOHYDRATE 18 MCG IN CAPS: 18.0000 ug | ORAL_CAPSULE | Freq: Every day | RESPIRATORY_TRACT | Status: DC

## 2016-01-06 NOTE — Discharge Instructions (Signed)
You need to get a primary care doctor! Get miralax and put one dose or 17 g in 8 ounces of water,  take 1 dose every 30 minutes for 2-3 hours or until you get good results and then once or twice daily to prevent constipation. You have an ultrasound of your gallbladder scheduled for later today at 3:45 PM. Arrive 15 minutes early to register in the radiology department. If you have gallstones you need to call Dr. Arnoldo Morale office to discuss having gallbladder surgery. If he did not have stones are need to follow-up with your primary care doctor or Dr. Laural Golden, the gastroenterologist (stomach specialist) on call.

## 2016-01-07 LAB — URINE CULTURE
Culture: 1000
Special Requests: NORMAL

## 2016-03-12 DIAGNOSIS — J449 Chronic obstructive pulmonary disease, unspecified: Secondary | ICD-10-CM | POA: Diagnosis not present

## 2016-05-12 DIAGNOSIS — K469 Unspecified abdominal hernia without obstruction or gangrene: Secondary | ICD-10-CM | POA: Diagnosis not present

## 2016-05-12 DIAGNOSIS — I10 Essential (primary) hypertension: Secondary | ICD-10-CM | POA: Diagnosis not present

## 2016-05-12 DIAGNOSIS — K219 Gastro-esophageal reflux disease without esophagitis: Secondary | ICD-10-CM | POA: Diagnosis not present

## 2016-05-12 DIAGNOSIS — J449 Chronic obstructive pulmonary disease, unspecified: Secondary | ICD-10-CM | POA: Diagnosis not present

## 2016-05-12 DIAGNOSIS — R739 Hyperglycemia, unspecified: Secondary | ICD-10-CM | POA: Diagnosis not present

## 2016-05-12 DIAGNOSIS — G4700 Insomnia, unspecified: Secondary | ICD-10-CM | POA: Diagnosis not present

## 2016-05-21 DIAGNOSIS — N184 Chronic kidney disease, stage 4 (severe): Secondary | ICD-10-CM | POA: Diagnosis not present

## 2016-05-21 DIAGNOSIS — I129 Hypertensive chronic kidney disease with stage 1 through stage 4 chronic kidney disease, or unspecified chronic kidney disease: Secondary | ICD-10-CM | POA: Diagnosis not present

## 2016-05-21 DIAGNOSIS — Z6834 Body mass index (BMI) 34.0-34.9, adult: Secondary | ICD-10-CM | POA: Diagnosis not present

## 2016-05-21 DIAGNOSIS — J449 Chronic obstructive pulmonary disease, unspecified: Secondary | ICD-10-CM | POA: Diagnosis not present

## 2016-12-08 DIAGNOSIS — J309 Allergic rhinitis, unspecified: Secondary | ICD-10-CM | POA: Diagnosis not present

## 2016-12-08 DIAGNOSIS — N184 Chronic kidney disease, stage 4 (severe): Secondary | ICD-10-CM | POA: Diagnosis not present

## 2016-12-08 DIAGNOSIS — J449 Chronic obstructive pulmonary disease, unspecified: Secondary | ICD-10-CM | POA: Diagnosis not present

## 2016-12-08 DIAGNOSIS — I10 Essential (primary) hypertension: Secondary | ICD-10-CM | POA: Diagnosis not present

## 2016-12-10 ENCOUNTER — Inpatient Hospital Stay (HOSPITAL_COMMUNITY)
Admission: EM | Admit: 2016-12-10 | Discharge: 2016-12-14 | DRG: 191 | Disposition: A | Discharge status: Home Health | Payer: Medicare Other | Attending: Internal Medicine | Admitting: Internal Medicine

## 2016-12-10 ENCOUNTER — Emergency Department (HOSPITAL_COMMUNITY): Payer: Medicare Other

## 2016-12-10 ENCOUNTER — Encounter (HOSPITAL_COMMUNITY): Payer: Self-pay | Admitting: Emergency Medicine

## 2016-12-10 DIAGNOSIS — J101 Influenza due to other identified influenza virus with other respiratory manifestations: Secondary | ICD-10-CM | POA: Diagnosis present

## 2016-12-10 DIAGNOSIS — R000 Tachycardia, unspecified: Secondary | ICD-10-CM | POA: Diagnosis present

## 2016-12-10 DIAGNOSIS — J208 Acute bronchitis due to other specified organisms: Secondary | ICD-10-CM

## 2016-12-10 DIAGNOSIS — E669 Obesity, unspecified: Secondary | ICD-10-CM | POA: Diagnosis present

## 2016-12-10 DIAGNOSIS — I129 Hypertensive chronic kidney disease with stage 1 through stage 4 chronic kidney disease, or unspecified chronic kidney disease: Secondary | ICD-10-CM | POA: Diagnosis present

## 2016-12-10 DIAGNOSIS — Z6837 Body mass index (BMI) 37.0-37.9, adult: Secondary | ICD-10-CM | POA: Diagnosis not present

## 2016-12-10 DIAGNOSIS — N179 Acute kidney failure, unspecified: Secondary | ICD-10-CM

## 2016-12-10 DIAGNOSIS — Z823 Family history of stroke: Secondary | ICD-10-CM | POA: Diagnosis not present

## 2016-12-10 DIAGNOSIS — Z87891 Personal history of nicotine dependence: Secondary | ICD-10-CM | POA: Diagnosis not present

## 2016-12-10 DIAGNOSIS — D638 Anemia in other chronic diseases classified elsewhere: Secondary | ICD-10-CM | POA: Diagnosis present

## 2016-12-10 DIAGNOSIS — B9689 Other specified bacterial agents as the cause of diseases classified elsewhere: Secondary | ICD-10-CM

## 2016-12-10 DIAGNOSIS — Z880 Allergy status to penicillin: Secondary | ICD-10-CM

## 2016-12-10 DIAGNOSIS — J440 Chronic obstructive pulmonary disease with acute lower respiratory infection: Secondary | ICD-10-CM | POA: Diagnosis present

## 2016-12-10 DIAGNOSIS — I4892 Unspecified atrial flutter: Secondary | ICD-10-CM | POA: Diagnosis present

## 2016-12-10 DIAGNOSIS — N183 Chronic kidney disease, stage 3 (moderate): Secondary | ICD-10-CM | POA: Diagnosis present

## 2016-12-10 DIAGNOSIS — Z7951 Long term (current) use of inhaled steroids: Secondary | ICD-10-CM

## 2016-12-10 DIAGNOSIS — R600 Localized edema: Secondary | ICD-10-CM | POA: Diagnosis present

## 2016-12-10 DIAGNOSIS — J209 Acute bronchitis, unspecified: Secondary | ICD-10-CM | POA: Diagnosis present

## 2016-12-10 DIAGNOSIS — I10 Essential (primary) hypertension: Secondary | ICD-10-CM | POA: Diagnosis not present

## 2016-12-10 DIAGNOSIS — Z91120 Patient's intentional underdosing of medication regimen due to financial hardship: Secondary | ICD-10-CM

## 2016-12-10 DIAGNOSIS — R7989 Other specified abnormal findings of blood chemistry: Secondary | ICD-10-CM | POA: Diagnosis not present

## 2016-12-10 DIAGNOSIS — R0602 Shortness of breath: Secondary | ICD-10-CM | POA: Diagnosis not present

## 2016-12-10 DIAGNOSIS — R0902 Hypoxemia: Secondary | ICD-10-CM | POA: Diagnosis present

## 2016-12-10 DIAGNOSIS — J441 Chronic obstructive pulmonary disease with (acute) exacerbation: Secondary | ICD-10-CM | POA: Diagnosis not present

## 2016-12-10 DIAGNOSIS — I509 Heart failure, unspecified: Secondary | ICD-10-CM | POA: Diagnosis not present

## 2016-12-10 DIAGNOSIS — Z8249 Family history of ischemic heart disease and other diseases of the circulatory system: Secondary | ICD-10-CM

## 2016-12-10 DIAGNOSIS — T443X6A Underdosing of other parasympatholytics [anticholinergics and antimuscarinics] and spasmolytics, initial encounter: Secondary | ICD-10-CM | POA: Diagnosis present

## 2016-12-10 DIAGNOSIS — R05 Cough: Secondary | ICD-10-CM | POA: Diagnosis not present

## 2016-12-10 DIAGNOSIS — R609 Edema, unspecified: Secondary | ICD-10-CM

## 2016-12-10 DIAGNOSIS — D649 Anemia, unspecified: Secondary | ICD-10-CM

## 2016-12-10 DIAGNOSIS — Z79899 Other long term (current) drug therapy: Secondary | ICD-10-CM

## 2016-12-10 LAB — CBC WITH DIFFERENTIAL/PLATELET
Basophils Absolute: 0 10*3/uL (ref 0.0–0.1)
Basophils Relative: 0 %
Eosinophils Absolute: 0.1 10*3/uL (ref 0.0–0.7)
Eosinophils Relative: 1 %
HCT: 31.5 % — ABNORMAL LOW (ref 39.0–52.0)
Hemoglobin: 10.3 g/dL — ABNORMAL LOW (ref 13.0–17.0)
LYMPHS PCT: 24 %
Lymphs Abs: 1.4 10*3/uL (ref 0.7–4.0)
MCH: 30.7 pg (ref 26.0–34.0)
MCHC: 32.7 g/dL (ref 30.0–36.0)
MCV: 94 fL (ref 78.0–100.0)
MONO ABS: 0.8 10*3/uL (ref 0.1–1.0)
MONOS PCT: 14 %
Neutro Abs: 3.4 10*3/uL (ref 1.7–7.7)
Neutrophils Relative %: 61 %
Platelets: 200 10*3/uL (ref 150–400)
RBC: 3.35 MIL/uL — AB (ref 4.22–5.81)
RDW: 12.9 % (ref 11.5–15.5)
WBC: 5.8 10*3/uL (ref 4.0–10.5)

## 2016-12-10 LAB — BASIC METABOLIC PANEL
Anion gap: 8 (ref 5–15)
BUN: 34 mg/dL — ABNORMAL HIGH (ref 6–20)
CO2: 25 mmol/L (ref 22–32)
Calcium: 8.3 mg/dL — ABNORMAL LOW (ref 8.9–10.3)
Chloride: 106 mmol/L (ref 101–111)
Creatinine, Ser: 2.82 mg/dL — ABNORMAL HIGH (ref 0.61–1.24)
GFR calc Af Amer: 25 mL/min — ABNORMAL LOW (ref >60)
GFR calc non Af Amer: 21 mL/min — ABNORMAL LOW (ref >60)
Glucose, Bld: 95 mg/dL (ref 65–99)
POTASSIUM: 3.8 mmol/L (ref 3.5–5.1)
Sodium: 139 mmol/L (ref 135–145)

## 2016-12-10 MED ORDER — DEXTROSE 5 % IV SOLN
500.0000 mg | INTRAVENOUS | Status: DC
  Administered 2016-12-11 – 2016-12-13 (×4): 500 mg via INTRAVENOUS
  Filled 2016-12-10 (×5): qty 500

## 2016-12-10 MED ORDER — GUAIFENESIN ER 600 MG PO TB12: 600.0000 mg | ORAL_TABLET | Freq: Two times a day (BID) | ORAL | Status: DC | PRN

## 2016-12-10 MED ORDER — ALBUTEROL SULFATE (2.5 MG/3ML) 0.083% IN NEBU
2.5000 mg | INHALATION_SOLUTION | Freq: Once | RESPIRATORY_TRACT | Status: AC
  Administered 2016-12-10: 2.5 mg via RESPIRATORY_TRACT
  Filled 2016-12-10: qty 3

## 2016-12-10 MED ORDER — METHYLPREDNISOLONE SODIUM SUCC 40 MG IJ SOLR
40.0000 mg | Freq: Three times a day (TID) | INTRAMUSCULAR | Status: DC
  Administered 2016-12-11 (×3): 40 mg via INTRAVENOUS
  Filled 2016-12-10 (×3): qty 1

## 2016-12-10 MED ORDER — BUDESONIDE 0.25 MG/2ML IN SUSP
0.2500 mg | Freq: Two times a day (BID) | RESPIRATORY_TRACT | Status: DC
  Administered 2016-12-11 – 2016-12-14 (×7): 0.25 mg via RESPIRATORY_TRACT
  Filled 2016-12-10 (×9): qty 2

## 2016-12-10 MED ORDER — AMLODIPINE BESYLATE 5 MG PO TABS
5.0000 mg | ORAL_TABLET | Freq: Every day | ORAL | Status: DC
Start: 2016-12-11 — End: 2016-12-11
  Administered 2016-12-11: 5 mg via ORAL
  Filled 2016-12-10: qty 1

## 2016-12-10 MED ORDER — IPRATROPIUM-ALBUTEROL 0.5-2.5 (3) MG/3ML IN SOLN: 3.0000 mL | Freq: Once | RESPIRATORY_TRACT | Status: DC

## 2016-12-10 MED ORDER — IPRATROPIUM-ALBUTEROL 0.5-2.5 (3) MG/3ML IN SOLN
3.0000 mL | Freq: Once | RESPIRATORY_TRACT | Status: AC
  Administered 2016-12-10: 3 mL via RESPIRATORY_TRACT
  Filled 2016-12-10: qty 3

## 2016-12-10 MED ORDER — SODIUM CHLORIDE 0.9 % IV BOLUS (SEPSIS)
1000.0000 mL | Freq: Once | INTRAVENOUS | Status: AC
  Administered 2016-12-10: 1000 mL via INTRAVENOUS

## 2016-12-10 MED ORDER — ALBUTEROL SULFATE (2.5 MG/3ML) 0.083% IN NEBU: 2.5000 mg | INHALATION_SOLUTION | Freq: Four times a day (QID) | RESPIRATORY_TRACT | 12 refills | Status: DC | PRN

## 2016-12-10 MED ORDER — METHYLPREDNISOLONE SODIUM SUCC 125 MG IJ SOLR
125.0000 mg | Freq: Once | INTRAMUSCULAR | Status: AC
  Administered 2016-12-10: 125 mg via INTRAVENOUS
  Filled 2016-12-10: qty 2

## 2016-12-10 MED ORDER — SODIUM CHLORIDE 0.9 % IV SOLN
INTRAVENOUS | Status: DC
  Administered 2016-12-11 (×2): via INTRAVENOUS

## 2016-12-10 NOTE — H&P (Addendum)
History and Physical    Darren Burke [id number] DOB: Jun 22, 1946 DOA: 12/10/2016  PCP: Rosita Fire, MD  Patient coming from: Home  Chief Complaint: Shortness of breath  HPI: Darren Burke is a 71 y.o. male with medical history significant of with a history of COPD and hypertension comes to the ED with complaints of shortness of breath. Patient states he has been feeling short of breath for several years now but earlier today after persistent nonproductive cough he became more short of breath. He tried to use his rescue inhaler at home couple of times without any relief of his symptoms therefore came to the ED. In the past patient has been on Spiriva but he is unable to afford it anymore therefore not filled his prescription. During this time he denies any fevers chills or sick contacts and other complaints. He reports adequate appetite and by mouth intake. According to him he doesn't have any history of arrhythmia, diabetes or any elevated blood pressure readings at home while being on Norvasc.  While I was evaluating him in the room his heart rate remained stable at high 90s and low 100 with sinus tachycardia likely after breathing treatment. But for the brief moment for about 10 seconds his heart rate jumped up to 150s which looked like atrial flutter on the monitor. It returned back to 90s again and he remained asymptomatic at this time.  ED Course: In the ED at first was noted to have expiratory wheezing therefore he was started on breathing treatments and IV Solu-Medrol. On the routine labs was noted his creatinine was elevated to 2.8, up from his baseline around 1.7 1.8. His breathing improved after the above treatments and he was started on IV fluids as well.  Review of Systems: As per HPI otherwise 10 point review of systems negative.    Past Medical History:  Diagnosis Date  . COPD (chronic obstructive pulmonary disease) (HCC)    Emphysema radiographically    Past Surgical  History:  Procedure Laterality Date  . ARTERY REPAIR     sinus     reports that he has quit smoking. His smoking use included Cigarettes. He has a 120.00 pack-year smoking history. He has never used smokeless tobacco. He reports that he does not drink alcohol or use drugs.  Allergies  Allergen Reactions  . Penicillins Hives and Itching    Has patient had a PCN reaction causing immediate rash, facial/tongue/throat swelling, SOB or lightheadedness with hypotension: no Has patient had a PCN reaction causing severe rash involving mucus membranes or skin necrosis: no Has patient had a PCN reaction that required hospitalization: no Has patient had a PCN reaction occurring within the last 10 years: no If all of the above answers are "NO", then may proceed with Cephalosporin use.    Family History  Problem Relation Age of Onset  . Heart failure Mother   . Stroke Father      Prior to Admission medications   Medication Sig Start Date End Date Taking? Authorizing Provider  acetaminophen (TYLENOL) 500 MG tablet Take 500 mg by mouth every 6 (six) hours as needed. pain   Yes Historical Provider, MD  amLODipine (NORVASC) 5 MG tablet Take 1 tablet by mouth daily. 11/17/16  Yes Historical Provider, MD  tiotropium (SPIRIVA HANDIHALER) 18 MCG inhalation capsule Place 1 capsule (18 mcg total) into inhaler and inhale daily. 01/06/16  Yes Rolland Porter, MD  albuterol (PROVENTIL) (2.5 MG/3ML) 0.083% nebulizer solution Take 3 mLs (2.5 mg total)  by nebulization every 6 (six) hours as needed for wheezing or shortness of breath. Please provide patient with a nebulizer machine 12/10/16   [name], MD  omeprazole (PRILOSEC) 20 MG capsule Take 1 po BID x 2 weeks then once a day 01/06/16   Rolland Porter, MD    Physical Exam: Vitals:   12/10/16 1800 12/10/16 1914 12/10/16 1955 12/10/16 2000  BP:  139/85  119/70  Pulse:  94  82  Resp:  16  (!) 9  Temp:      TempSrc:      SpO2:  96% 92% 98%  Weight: 114.3 kg (252 lb)      Height: 5\' 10"  (1.778 m)         Constitutional: NAD, calm, comfortable Vitals:   12/10/16 1800 12/10/16 1914 12/10/16 1955 12/10/16 2000  BP:  139/85  119/70  Pulse:  94  82  Resp:  16  (!) 9  Temp:      TempSrc:      SpO2:  96% 92% 98%  Weight: 114.3 kg (252 lb)     Height: 5\' 10"  (1.778 m)      Eyes: PERRL, lids and conjunctivae normal ENMT: Mucous membranes are moist. Posterior pharynx clear of any exudate or lesions.Normal dentition.  Neck: normal, supple, no masses, no thyromegaly Respiratory: Diffuse diminished breath sounds with mild expiratory wheezing Cardiovascular: Regular rate and rhythm, no murmurs / rubs / gallops. No extremity edema. 2+ pedal pulses. No carotid bruits.  Abdomen: no tenderness, no masses palpated. No hepatosplenomegaly. Bowel sounds positive.  Musculoskeletal: no clubbing / cyanosis. No joint deformity upper and lower extremities. Good ROM, no contractures. Normal muscle tone.  Skin: no rashes, lesions, ulcers. No induration Neurologic: CN 2-12 grossly intact. Sensation intact, DTR normal. Strength 5/5 in all 4.  Psychiatric: Normal judgment and insight. Alert and oriented x 3. Normal mood.   Labs on Admission: I have personally reviewed following labs and imaging studies  CBC:  Recent Labs Lab 12/10/16 1915  WBC 5.8  NEUTROABS 3.4  HGB 10.3*  HCT 31.5*  MCV 94.0  PLT 785   Basic Metabolic Panel:  Recent Labs Lab 12/10/16 1915  NA 139  K 3.8  CL 106  CO2 25  GLUCOSE 95  BUN 34*  CREATININE 2.82*  CALCIUM 8.3*   GFR: Estimated Creatinine Clearance: 31.7 mL/min (by C-G formula based on SCr of 2.82 mg/dL (H)). Liver Function Tests: No results for input(s): AST, ALT, ALKPHOS, BILITOT, PROT, ALBUMIN in the last 168 hours. No results for input(s): LIPASE, AMYLASE in the last 168 hours. No results for input(s): AMMONIA in the last 168 hours. Coagulation Profile: No results for input(s): INR, PROTIME in the last 168  hours. Cardiac Enzymes: No results for input(s): CKTOTAL, CKMB, CKMBINDEX, TROPONINI in the last 168 hours. BNP (last 3 results) No results for input(s): PROBNP in the last 8760 hours. HbA1C: No results for input(s): HGBA1C in the last 72 hours. CBG: No results for input(s): GLUCAP in the last 168 hours. Lipid Profile: No results for input(s): CHOL, HDL, LDLCALC, TRIG, CHOLHDL, LDLDIRECT in the last 72 hours. Thyroid Function Tests: No results for input(s): TSH, T4TOTAL, FREET4, T3FREE, THYROIDAB in the last 72 hours. Anemia Panel: No results for input(s): VITAMINB12, FOLATE, FERRITIN, TIBC, IRON, RETICCTPCT in the last 72 hours. Urine analysis:    Component Value Date/Time   COLORURINE YELLOW 01/05/2016 2353   APPEARANCEUR CLEAR 01/05/2016 2353   LABSPEC 1.010 01/05/2016 2353   PHURINE  5.0 01/05/2016 2353   GLUCOSEU NEGATIVE 01/05/2016 2353   HGBUR LARGE (A) 01/05/2016 2353   BILIRUBINUR NEGATIVE 01/05/2016 2353   KETONESUR NEGATIVE 01/05/2016 2353   PROTEINUR 100 (A) 01/05/2016 2353   UROBILINOGEN 0.2 11/10/2014 0400   NITRITE NEGATIVE 01/05/2016 2353   LEUKOCYTESUR SMALL (A) 01/05/2016 2353   Sepsis Labs: !!!!!!!!!!!!!!!!!!!!!!!!!!!!!!!!!!!!!!!!!!!! @LABRCNTIP (procalcitonin:4,lacticidven:4) )No results found for this or any previous visit (from the past 240 hour(s)).   Radiological Exams on Admission: Dg Chest 2 View  Result Date: 12/10/2016 CLINICAL DATA:  71 y/o  M; cough, upper back pain, chest soreness. EXAM: CHEST  2 VIEW COMPARISON:  11/09/2014 chest radiograph. FINDINGS: Stable normal cardiac silhouette. Hyperinflated lungs with flattened diaphragms compatible with COPD. No acute osseous abnormality. Bronchitic markings in the lung bases. No focal consolidation or pleural effusion. IMPRESSION: COPD. Bronchitic markings in the lung bases. No focal consolidation or pleural effusion. Electronically Signed   By: Kristine Garbe M.D.   On: 12/10/2016 18:22     EKG: Independently reviewed.   Assessment/Plan Principal Problem:   AKI (acute kidney injury) ([place]) Active Problems:   COPD exacerbation (HCC)   Sinus tachycardia   HTN (hypertension)   Paroxysmal atrial flutter (HCC)   Anemia   Acute bacterial bronchitis   1. Acute COPD exacerbation/complicated by may be acute bacterial bronchitis -Admit for further care -Duonebs, Solu-Medrol IV, Mucinex, azithromycin IV -Supplemental O2 as needed -Will need to go home on some maintenance bronchodilator. We will have to determine which one is more cost effective for him. We'll start Pulmicort here for now.  2. 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 Could be from uncontrolled hypertension at home or undiagnosed diabetes type 2 -We'll give IV fluids tonight and avoid nephrotoxic drugs. We'll continue to check blood pressures here to determine if current blood pressure medication is enough. If hydration doesn't improve his kidney function, will check electrolytes in his urine and renal ultrasound.  3. Questionable paroxysmal atrial flutter -While also in the room patient had a 10 second episode when his heart rate went up to 150s and on the monitor it looked like a flutter. He was asymptomatic at the time and his heart rate returned to high 90s again with slight sinus tach. -His potassium looks okay at this time will check magnesium in the morning. We'll also place him on telemetry. If this rhythm stays persistent or very frequent, left and get an echocardiogram and consider further workup/management  4. Hypertension -Continue Norvasc  5. Anemia-likely from chronic disease. Not active at this time therefore will hold off on checking any further iron studies.  DVT prophylaxis: SCDs Code Status: Full Family Communication: Son at bedside Disposition Plan: To be determined Consults called: None needed Admission status: Admission to telemetry   Ankit Arsenio Loader MD [facility] Pager [phone]   If 7PM-7AM, please contact night-coverage [email or web address] Password [facility]  12/10/2016, 10:52 PM

## 2016-12-10 NOTE — ED Triage Notes (Signed)
Pt c/o sob today. Pt states he ran out of his Spiriva inhaler and wants a prescription for something less expensive. Pt also c/o upper back pain and np cough.

## 2016-12-10 NOTE — ED Provider Notes (Signed)
[facility] DEPT Provider Note   CSN: [id number] Arrival date & time: 12/10/16  1738     History   Chief Complaint Chief Complaint  Patient presents with  . Shortness of Breath    HPI Darren Burke is a 70 y.o. male.  Patient with known COPD presents with coughing and wheezing. He has not been using his Spiriva inhaler secondary to expense. He has been using the albuterol inhaler. No fever, sweats, chills, rusty sputum. His creatinines been slightly elevated in the past, but he has not seen a nephrologist. No chest pain or excessive dyspnea. Severity of symptoms is moderate.      Past Medical History:  Diagnosis Date  . COPD (chronic obstructive pulmonary disease) (HCC)    Emphysema radiographically    Patient Active Problem List   Diagnosis Date Noted  . HTN (hypertension) 12/10/2016  . AKI (acute kidney injury) ([place]) 12/10/2016  . Paroxysmal atrial flutter ([place]) 12/10/2016  . Anemia 12/10/2016  . Acute bacterial bronchitis 12/10/2016  . Pneumonia 11/10/2014  . Hypoxia   . Acute on chronic respiratory failure with hypoxia ([place])   . COPD exacerbation ([place]) 10/05/2013  . PNA (pneumonia) 10/05/2013  . Acute respiratory failure with hypoxia ([place]) 10/05/2013  . Sinus tachycardia 10/05/2013  . SOB (shortness of breath) 10/05/2013  . CAP (community acquired pneumonia) 10/05/2013  . Hyperglycemia, drug-induced 10/05/2013  . Pulmonary emphysema ([place])     Past Surgical History:  Procedure Laterality Date  . ARTERY REPAIR     sinus       Home Medications    Prior to Admission medications   Medication Sig Start Date End Date Taking? Authorizing Provider  acetaminophen (TYLENOL) 500 MG tablet Take 500 mg by mouth every 6 (six) hours as needed. pain   Yes Historical Provider, MD  amLODipine (NORVASC) 5 MG tablet Take 1 tablet by mouth daily. 11/17/16  Yes Historical Provider, MD  tiotropium (SPIRIVA HANDIHALER) 18 MCG inhalation capsule Place 1 capsule (18 mcg  total) into inhaler and inhale daily. 01/06/16  Yes Rolland Porter, MD  albuterol (PROVENTIL) (2.5 MG/3ML) 0.083% nebulizer solution Take 3 mLs (2.5 mg total) by nebulization every 6 (six) hours as needed for wheezing or shortness of breath. Please provide patient with a nebulizer machine 12/10/16   [name], MD  omeprazole (PRILOSEC) 20 MG capsule Take 1 po BID x 2 weeks then once a day 01/06/16   Rolland Porter, MD    Family History Family History  Problem Relation Age of Onset  . Heart failure Mother   . Stroke Father     Social History Social History  Substance Use Topics  . Smoking status: Former Smoker    Packs/day: 3.00    Years: 40.00    Types: Cigarettes  . Smokeless tobacco: Never Used  . Alcohol use No     Allergies   Penicillins   Review of Systems Review of Systems  All other systems reviewed and are negative.    Physical Exam Updated Vital Signs BP 144/78   Pulse 105   Temp 100.2 F (37.9 C) (Oral)   Resp 23   Ht 5\' 10"  (1.778 m)   Wt 252 lb (114.3 kg)   SpO2 93%   BMI 36.16 kg/m   Physical Exam  Constitutional: He is oriented to person, place, and time. He appears well-developed and well-nourished.  HENT:  Head: Normocephalic and atraumatic.  Eyes: Conjunctivae are normal.  Neck: Neck supple.  Cardiovascular: Normal rate and regular  rhythm.   Pulmonary/Chest:  Slight expiratory wheeze  Abdominal: Soft. Bowel sounds are normal.  Musculoskeletal: Normal range of motion.  Neurological: He is alert and oriented to person, place, and time.  Skin: Skin is warm and dry.  Psychiatric: He has a normal mood and affect. His behavior is normal.  Nursing note and vitals reviewed.    ED Treatments / Results  Labs (all labs ordered are listed, but only abnormal results are displayed) Labs Reviewed  CBC WITH DIFFERENTIAL/PLATELET - Abnormal; Notable for the following:       Result Value   RBC 3.35 (*)    Hemoglobin 10.3 (*)    HCT 31.5 (*)    All other  components within normal limits  BASIC METABOLIC PANEL - Abnormal; Notable for the following:    BUN 34 (*)    Creatinine, Ser 2.82 (*)    Calcium 8.3 (*)    GFR calc non Af Amer 21 (*)    GFR calc Af Amer 25 (*)    All other components within normal limits    EKG  EKG Interpretation  Date/Time:  Thursday December 10 2016 19:14:07 EST Ventricular Rate:  91 PR Interval:    QRS Duration: 118 QT Interval:  367 QTC Calculation: 452 R Axis:   73 Text Interpretation:  Sinus rhythm Short PR interval Nonspecific intraventricular conduction delay Low voltage, extremity leads Confirmed by Lacinda Axon  MD, Flynn Lininger ([id number]) on 12/10/2016 10:04:51 PM       Radiology Dg Chest 2 View  Result Date: 12/10/2016 CLINICAL DATA:  71 y/o  M; cough, upper back pain, chest soreness. EXAM: CHEST  2 VIEW COMPARISON:  11/09/2014 chest radiograph. FINDINGS: Stable normal cardiac silhouette. Hyperinflated lungs with flattened diaphragms compatible with COPD. No acute osseous abnormality. Bronchitic markings in the lung bases. No focal consolidation or pleural effusion. IMPRESSION: COPD. Bronchitic markings in the lung bases. No focal consolidation or pleural effusion. Electronically Signed   By: Kristine Garbe M.D.   On: 12/10/2016 18:22    Procedures Procedures (including critical care time)  Medications Ordered in ED Medications  budesonide (PULMICORT) nebulizer solution 0.25 mg (not administered)  ipratropium-albuterol (DUONEB) 0.5-2.5 (3) MG/3ML nebulizer solution 3 mL (3 mLs Nebulization Given 12/10/16 1955)  methylPREDNISolone sodium succinate (SOLU-MEDROL) 125 mg/2 mL injection 125 mg (125 mg Intravenous Given 12/10/16 1937)  albuterol (PROVENTIL) (2.5 MG/3ML) 0.083% nebulizer solution 2.5 mg (2.5 mg Nebulization Given 12/10/16 1955)  sodium chloride 0.9 % bolus 1,000 mL (1,000 mLs Intravenous New Bag/Given 12/10/16 2226)     Initial Impression / Assessment and Plan / ED Course  I have reviewed the  triage vital signs and the nursing notes.  Pertinent labs & imaging results that were available during my care of the patient were reviewed by me and considered in my medical decision making (see chart for details).     Chest x-ray shows no obvious pneumonia. Rx nebulizer treatment and IV steroids. Creatinine noted to be 2.82 which is a marked elevation from his last check. Will admit to observation.  Final Clinical Impressions(s) / ED Diagnoses   Final diagnoses:  COPD exacerbation (HCC)  Creatinine elevation    New Prescriptions New Prescriptions   ALBUTEROL (PROVENTIL) (2.5 MG/3ML) 0.083% NEBULIZER SOLUTION    Take 3 mLs (2.5 mg total) by nebulization every 6 (six) hours as needed for wheezing or shortness of breath. Please provide patient with a nebulizer machine     [name], MD 12/10/16 2322

## 2016-12-11 ENCOUNTER — Encounter (HOSPITAL_COMMUNITY): Payer: Self-pay

## 2016-12-11 ENCOUNTER — Inpatient Hospital Stay (HOSPITAL_COMMUNITY): Payer: Medicare Other

## 2016-12-11 LAB — COMPREHENSIVE METABOLIC PANEL
ALK PHOS: 53 U/L (ref 38–126)
ALT: 23 U/L (ref 17–63)
AST: 35 U/L (ref 15–41)
Albumin: 3.5 g/dL (ref 3.5–5.0)
Anion gap: 14 (ref 5–15)
BUN: 34 mg/dL — AB (ref 6–20)
CO2: 20 mmol/L — AB (ref 22–32)
Calcium: 8.5 mg/dL — ABNORMAL LOW (ref 8.9–10.3)
Chloride: 104 mmol/L (ref 101–111)
Creatinine, Ser: 2.83 mg/dL — ABNORMAL HIGH (ref 0.61–1.24)
GFR, EST AFRICAN AMERICAN: 25 mL/min — AB (ref >60)
GFR, EST NON AFRICAN AMERICAN: 21 mL/min — AB (ref >60)
GLUCOSE: 192 mg/dL — AB (ref 65–99)
Potassium: 4 mmol/L (ref 3.5–5.1)
Sodium: 138 mmol/L (ref 135–145)
TOTAL PROTEIN: 8.1 g/dL (ref 6.5–8.1)
Total Bilirubin: 0.4 mg/dL (ref 0.3–1.2)

## 2016-12-11 LAB — GLUCOSE, CAPILLARY: Glucose-Capillary: 171 mg/dL — ABNORMAL HIGH (ref 65–99)

## 2016-12-11 LAB — INFLUENZA PANEL BY PCR (TYPE A & B)
INFLBPCR: POSITIVE — AB
Influenza A By PCR: NEGATIVE

## 2016-12-11 MED ORDER — DEXTROSE 5 % IV SOLN
INTRAVENOUS | Status: AC
  Filled 2016-12-11: qty 500

## 2016-12-11 MED ORDER — GUAIFENESIN ER 600 MG PO TB12
600.0000 mg | ORAL_TABLET | Freq: Two times a day (BID) | ORAL | Status: DC
  Administered 2016-12-11 – 2016-12-13 (×4): 600 mg via ORAL
  Filled 2016-12-11 (×4): qty 1

## 2016-12-11 MED ORDER — CARVEDILOL 3.125 MG PO TABS
3.1250 mg | ORAL_TABLET | Freq: Two times a day (BID) | ORAL | Status: DC
  Administered 2016-12-12 – 2016-12-14 (×5): 3.125 mg via ORAL
  Filled 2016-12-11 (×5): qty 1

## 2016-12-11 MED ORDER — SIMETHICONE 80 MG PO CHEW
80.0000 mg | CHEWABLE_TABLET | Freq: Three times a day (TID) | ORAL | Status: DC | PRN
  Administered 2016-12-11: 80 mg via ORAL
  Filled 2016-12-11: qty 1

## 2016-12-11 MED ORDER — OSELTAMIVIR PHOSPHATE 30 MG PO CAPS
30.0000 mg | ORAL_CAPSULE | Freq: Every day | ORAL | Status: DC
  Administered 2016-12-11 – 2016-12-13 (×3): 30 mg via ORAL
  Filled 2016-12-11 (×3): qty 1

## 2016-12-11 MED ORDER — FLUTICASONE PROPIONATE 50 MCG/ACT NA SUSP
2.0000 | Freq: Every day | NASAL | Status: DC
  Administered 2016-12-12: 2 via NASAL
  Filled 2016-12-11: qty 16

## 2016-12-11 MED ORDER — ASPIRIN EC 81 MG PO TBEC
81.0000 mg | DELAYED_RELEASE_TABLET | Freq: Every day | ORAL | Status: DC
  Administered 2016-12-12 – 2016-12-14 (×3): 81 mg via ORAL
  Filled 2016-12-11 (×3): qty 1

## 2016-12-11 MED ORDER — ACETAMINOPHEN 325 MG PO TABS
650.0000 mg | ORAL_TABLET | Freq: Four times a day (QID) | ORAL | Status: DC | PRN
  Administered 2016-12-12: 650 mg via ORAL
  Filled 2016-12-11 (×2): qty 2

## 2016-12-11 MED ORDER — METHYLPREDNISOLONE SODIUM SUCC 40 MG IJ SOLR
40.0000 mg | Freq: Two times a day (BID) | INTRAMUSCULAR | Status: DC
  Administered 2016-12-12 – 2016-12-13 (×3): 40 mg via INTRAVENOUS
  Filled 2016-12-11 (×3): qty 1

## 2016-12-11 MED ORDER — IPRATROPIUM-ALBUTEROL 0.5-2.5 (3) MG/3ML IN SOLN
3.0000 mL | RESPIRATORY_TRACT | Status: DC
  Administered 2016-12-11 – 2016-12-12 (×7): 3 mL via RESPIRATORY_TRACT
  Filled 2016-12-11 (×7): qty 3

## 2016-12-11 NOTE — Care Management Note (Addendum)
Case Management Note  Patient Details  Name: ESPIRIDION SUPINSKI MRN: [id number] Date of Birth: 02-11-1946  Subjective/Objective:     Patient adm with AKI, he lives alone, ind with ADL's. He walk with a cane. He has a PCP, still drives to appointments. He reports issues with affording his medications recently. He is unsure if his Medicaid/ Medicare is still active. CM has called [facility] to verify patients insurance.              Action/Plan: CM following for needs. If patient does not currently have Medicaid or Medicare, he may need a MATCH.  Later entry: Patient does still have Medicaid and Medicare. He was recently unable to get his Spriva filled, due to it not being in formulary. He will need an alternate medication. [facility] has notified patient.   Expected Discharge Date:     12/12/2016             Expected Discharge Plan:  Home/Self Care  In-House Referral:  NA  Discharge planning Services  CM Consult  Post Acute Care Choice:    Choice offered to:     DME Arranged:    DME Agency:     HH Arranged:    HH Agency:     Status of Service:  In process, will continue to follow  If discussed at Long Length of Stay Meetings, dates discussed:    Additional Comments:  Roey Coopman, Chauncey Reading, RN 12/11/2016, 11:03 AM

## 2016-12-11 NOTE — Progress Notes (Signed)
PROGRESS NOTE  Darren Burke MGN:[id number] DOB: 1946-08-20 DOA: 12/10/2016 PCP: Rosita Fire, MD  HPI/Recap of past 24 hours:  Report nasal congestion, intermittent nonproductive cough,  Son at bedside  Assessment/Plan: Principal Problem:   AKI (acute kidney injury) ([place]) Active Problems:   COPD exacerbation ([place])   Sinus tachycardia   HTN (hypertension)   Paroxysmal atrial flutter (HCC)   Anemia   Acute bacterial bronchitis  Influenza B/Bronchitis/copd exacerbation: On tamiflu, rocephin/zithromax, nebs/mucinex, taper steroid, on exam no wheezing today.   Cr elevated, not sure if acute or chronic Cr did not improve with hydration, renal US unremarkable kidneys, ua with few bacteria, small leukocyte esterase, 6-30 wbc,  6-30 rbc Though patient denies urinary symptoms.  He is already on abx May need outpatient nephrology follow up, suspect this is progressive CKD  Bilateral lower extremity edema: report chronic ,  Will check bilateral lower extremity venous US to r/o DVT, will get echocardiogram  HTN; patient report he was recently started on norvasc, will d/c norvasc due to edema, try low dose coreg   Normocytic anemia: hgb 10, anemia work up, no need of transfusion, need to follow up with pmd.  hepatosteatosis : incidental finding on renal US, lft wnl, will check lipid panel, hepatitis panel, patient does not drink alcohol.   Obesity: Body mass index is 37.07 kg/m.  Code Status: full  Family Communication: patient and son at bedside  Disposition Plan: home , likely on 2/17   Consultants:  none  Procedures:  none  Antibiotics:  Rocephin/zithro from admission   Objective: BP 132/78 (BP Location: Left Arm)   Pulse (!) 101   Temp 98.7 F (37.1 C) (Oral)   Resp (!) 22   Ht 5\' 10"  (1.778 m)   Wt 117.2 kg (258 lb 6 oz)   SpO2 90%   BMI 37.07 kg/m   Intake/Output Summary (Last 24 hours) at 12/11/16 1355 Last data filed at 12/11/16 1300  Gross per 24 hour  Intake          1031.67 ml  Output             1800 ml  Net          -768.33 ml   Filed Weights   12/10/16 1800 12/10/16 2346 12/11/16 0500  Weight: 114.3 kg (252 lb) 115.4 kg (254 lb 6 oz) 117.2 kg (258 lb 6 oz)    Exam:   General:  NAD  Cardiovascular: RRR  Respiratory: CTABL  Abdomen: Soft/ND/NT, positive BS  Musculoskeletal: trace pitting  Edema bilateral lower extremity  Neuro: aaox3  Data Reviewed: Basic Metabolic Panel:  Recent Labs Lab 12/10/16 1915 12/11/16 0447  NA 139 138  K 3.8 4.0  CL 106 104  CO2 25 20*  GLUCOSE 95 192*  BUN 34* 34*  CREATININE 2.82* 2.83*  CALCIUM 8.3* 8.5*   Liver Function Tests:  Recent Labs Lab 12/11/16 0447  AST 35  ALT 23  ALKPHOS 53  BILITOT 0.4  PROT 8.1  ALBUMIN 3.5   No results for input(s): LIPASE, AMYLASE in the last 168 hours. No results for input(s): AMMONIA in the last 168 hours. CBC:  Recent Labs Lab 12/10/16 1915  WBC 5.8  NEUTROABS 3.4  HGB 10.3*  HCT 31.5*  MCV 94.0  PLT 200   Cardiac Enzymes:   No results for input(s): CKTOTAL, CKMB, CKMBINDEX, TROPONINI in the last 168 hours. BNP (last 3 results) No results for input(s): BNP in the last 8760 hours.  ProBNP (last 3 results) No results for input(s): PROBNP in the last 8760 hours.  CBG:  Recent Labs Lab 12/11/16 0736  GLUCAP 171*    No results found for this or any previous visit (from the past 240 hour(s)).   Studies: Dg Chest 2 View  Result Date: 12/10/2016 CLINICAL DATA:  71 y/o  M; cough, upper back pain, chest soreness. EXAM: CHEST  2 VIEW COMPARISON:  11/09/2014 chest radiograph. FINDINGS: Stable normal cardiac silhouette. Hyperinflated lungs with flattened diaphragms compatible with COPD. No acute osseous abnormality. Bronchitic markings in the lung bases. No focal consolidation or pleural effusion. IMPRESSION: COPD. Bronchitic markings in the lung bases. No focal consolidation or pleural effusion.  Electronically Signed   By: Kristine Garbe M.D.   On: 12/10/2016 18:22    Scheduled Meds: . amLODipine  5 mg Oral Daily  . azithromycin  500 mg Intravenous Q24H  . budesonide (PULMICORT) nebulizer solution  0.25 mg Nebulization BID  . ipratropium-albuterol  3 mL Nebulization Q4H  . methylPREDNISolone (SOLU-MEDROL) injection  40 mg Intravenous Q8H    Continuous Infusions: . sodium chloride 100 mL/hr at 12/11/16 0012     Time spent: 43mins  Arbutus Nelligan MD, PhD  [facility] Pager [phone]. If 7PM-7AM, please contact night-coverage at [email or web address], password [facility] 12/11/2016, 1:55 PM  LOS: 1 day

## 2016-12-11 NOTE — Progress Notes (Signed)
Pt c/o shortness of breath and having expiratory wheezing. New order for duonebs q4h. Respiratory called and made aware. Will continue to monitor pt.

## 2016-12-11 NOTE — Progress Notes (Signed)
Pt c/o gas and requesting medication. Dr. Reesa Chew on call paged. Waiting for call back.

## 2016-12-12 ENCOUNTER — Inpatient Hospital Stay (HOSPITAL_COMMUNITY): Payer: Medicare Other

## 2016-12-12 DIAGNOSIS — I509 Heart failure, unspecified: Secondary | ICD-10-CM

## 2016-12-12 LAB — FOLATE: Folate: 11.3 ng/mL (ref >5.9)

## 2016-12-12 LAB — CBC
HCT: 29.6 % — ABNORMAL LOW (ref 39.0–52.0)
Hemoglobin: 9.6 g/dL — ABNORMAL LOW (ref 13.0–17.0)
MCH: 30.4 pg (ref 26.0–34.0)
MCHC: 32.4 g/dL (ref 30.0–36.0)
MCV: 93.7 fL (ref 78.0–100.0)
PLATELETS: 202 10*3/uL (ref 150–400)
RBC: 3.16 MIL/uL — AB (ref 4.22–5.81)
RDW: 13.1 % (ref 11.5–15.5)
WBC: 9.4 10*3/uL (ref 4.0–10.5)

## 2016-12-12 LAB — BASIC METABOLIC PANEL
Anion gap: 10 (ref 5–15)
BUN: 46 mg/dL — AB (ref 6–20)
CHLORIDE: 105 mmol/L (ref 101–111)
CO2: 21 mmol/L — ABNORMAL LOW (ref 22–32)
CREATININE: 2.72 mg/dL — AB (ref 0.61–1.24)
Calcium: 8.2 mg/dL — ABNORMAL LOW (ref 8.9–10.3)
GFR, EST AFRICAN AMERICAN: 26 mL/min — AB (ref >60)
GFR, EST NON AFRICAN AMERICAN: 22 mL/min — AB (ref >60)
Glucose, Bld: 117 mg/dL — ABNORMAL HIGH (ref 65–99)
POTASSIUM: 4.5 mmol/L (ref 3.5–5.1)
SODIUM: 136 mmol/L (ref 135–145)

## 2016-12-12 LAB — RETICULOCYTES
RBC.: 3.16 MIL/uL — ABNORMAL LOW (ref 4.22–5.81)
RETIC COUNT ABSOLUTE: 25.3 10*3/uL (ref 19.0–186.0)
Retic Ct Pct: 0.8 % (ref 0.4–3.1)

## 2016-12-12 LAB — LIPID PANEL
CHOL/HDL RATIO: 4.1 ratio
Cholesterol: 158 mg/dL (ref 0–200)
HDL: 39 mg/dL — ABNORMAL LOW (ref >40)
LDL CALC: 103 mg/dL — AB (ref 0–99)
Triglycerides: 80 mg/dL (ref <150)
VLDL: 16 mg/dL (ref 0–40)

## 2016-12-12 LAB — GLUCOSE, CAPILLARY: GLUCOSE-CAPILLARY: 115 mg/dL — AB (ref 65–99)

## 2016-12-12 LAB — MRSA PCR SCREENING: MRSA BY PCR: NEGATIVE

## 2016-12-12 LAB — ECHOCARDIOGRAM COMPLETE
Height: 70 in
Weight: 4060.8 oz

## 2016-12-12 LAB — IRON AND TIBC
Iron: 72 ug/dL (ref 45–182)
SATURATION RATIOS: 26 % (ref 17.9–39.5)
TIBC: 281 ug/dL (ref 250–450)
UIBC: 209 ug/dL

## 2016-12-12 LAB — TSH: TSH: 0.566 u[IU]/mL (ref 0.350–4.500)

## 2016-12-12 LAB — CK: CK TOTAL: 899 U/L — AB (ref 49–397)

## 2016-12-12 LAB — VITAMIN B12: Vitamin B-12: 155 pg/mL — ABNORMAL LOW (ref 180–914)

## 2016-12-12 MED ORDER — IPRATROPIUM-ALBUTEROL 0.5-2.5 (3) MG/3ML IN SOLN
3.0000 mL | Freq: Three times a day (TID) | RESPIRATORY_TRACT | Status: DC
  Administered 2016-12-12 – 2016-12-14 (×6): 3 mL via RESPIRATORY_TRACT
  Filled 2016-12-12 (×6): qty 3

## 2016-12-12 MED ORDER — POLYETHYLENE GLYCOL 3350 17 G PO PACK
17.0000 g | PACK | Freq: Every day | ORAL | Status: DC | PRN
  Administered 2016-12-12: 17 g via ORAL
  Filled 2016-12-12: qty 1

## 2016-12-12 MED ORDER — FLUTICASONE PROPIONATE 50 MCG/ACT NA SUSP: 2.0000 | Freq: Every day | NASAL | 0 refills | Status: DC

## 2016-12-12 MED ORDER — OSELTAMIVIR PHOSPHATE 30 MG PO CAPS: 30.0000 mg | ORAL_CAPSULE | Freq: Every day | ORAL | 0 refills | Status: AC

## 2016-12-12 MED ORDER — ASPIRIN 81 MG PO TBEC: 81.0000 mg | DELAYED_RELEASE_TABLET | Freq: Every day | ORAL | 0 refills | Status: AC

## 2016-12-12 MED ORDER — LIDOCAINE 5 % EX PTCH
1.0000 | MEDICATED_PATCH | CUTANEOUS | Status: DC
  Administered 2016-12-12 – 2016-12-13 (×2): 1 via TRANSDERMAL
  Filled 2016-12-12 (×2): qty 1

## 2016-12-12 MED ORDER — ALBUTEROL SULFATE HFA 108 (90 BASE) MCG/ACT IN AERS: 2.0000 | INHALATION_SPRAY | Freq: Four times a day (QID) | RESPIRATORY_TRACT | 2 refills | Status: DC | PRN

## 2016-12-12 MED ORDER — GUAIFENESIN ER 600 MG PO TB12: 600.0000 mg | ORAL_TABLET | Freq: Two times a day (BID) | ORAL | 0 refills | Status: DC

## 2016-12-12 NOTE — Progress Notes (Signed)
*  PRELIMINARY RESULTS* Echocardiogram 2D Echocardiogram has been performed.  Samuel Germany 12/12/2016, 2:08 PM

## 2016-12-12 NOTE — Progress Notes (Signed)
SATURATION QUALIFICATIONS: (This note is used to comply with regulatory documentation for home oxygen)  Patient Saturations on Room Air at Rest = 93%  Patient Saturations on Room Air while Ambulating = 89%  Patient Saturations on 2 Liters of oxygen while Ambulating = 94%  Please briefly explain why patient needs home oxygen:

## 2016-12-12 NOTE — Progress Notes (Signed)
PROGRESS NOTE  Darren Burke DXI:[id number] DOB: 17-Mar-1946 DOA: 12/10/2016 PCP: Rosita Fire, MD  HPI/Recap of past 24 hours:  Report nasal congestion, intermittent nonproductive cough,  Son at bedside  Assessment/Plan: Principal Problem:   AKI (acute kidney injury) ([place]) Active Problems:   COPD exacerbation ([place])   Sinus tachycardia   HTN (hypertension)   Paroxysmal atrial flutter (HCC)   Anemia   Acute bacterial bronchitis  Influenza B/Bronchitis/copd exacerbation: On tamiflu, rocephin/zithromax, nebs/mucinex, taper steroid, on exam mild wheezing today. But RN reported patient has hypoxia on exertion, will continue current regimen, check ambulatory o2 sats, may need home o2 if stats less than 88%.   Cr elevated, not sure if acute or chronic Cr did not improve with hydration, renal US unremarkable kidneys, ua with few bacteria, small leukocyte esterase, 6-30 wbc,  6-30 rbc Though patient denies urinary symptoms.  He is already on abx May need outpatient nephrology follow up, suspect this is progressive CKD  Bilateral lower extremity edema: report chronic ,   bilateral lower extremity venous US negative for DVT,  Echocardiogram pending D/c norvasc due to side effect of edema  HTN; patient report he was recently started on norvasc, will d/c norvasc due to edema, try low dose coreg   Normocytic anemia: hgb 10, anemia work up, no need of transfusion, need to follow up with pmd.  hepatosteatosis : incidental finding on renal US, lft wnl, will check lipid panel, hepatitis panel, patient does not drink alcohol.   Obesity: Body mass index is 36.42 kg/m.  Code Status: full  Family Communication: patient and son at bedside  Disposition Plan: home  likely on 2/18   Consultants:  none  Procedures:  none  Antibiotics:  Rocephin/zithro from admission   Objective: BP 126/80 (BP Location: Right Arm)   Pulse 90   Temp 97.6 F (36.4 C) (Oral)   Resp 20    Ht 5\' 10"  (1.778 m)   Wt 115.1 kg (253 lb 12.8 oz)   SpO2 97%   BMI 36.42 kg/m   Intake/Output Summary (Last 24 hours) at 12/12/16 1615 Last data filed at 12/12/16 1521  Gross per 24 hour  Intake              240 ml  Output             3050 ml  Net            -2810 ml   Filed Weights   12/10/16 2346 12/11/16 0500 12/12/16 0541  Weight: 115.4 kg (254 lb 6 oz) 117.2 kg (258 lb 6 oz) 115.1 kg (253 lb 12.8 oz)    Exam:   General:  NAD  Cardiovascular: RRR  Respiratory: mild intermittent wheezing, overall diminished  Abdomen: Soft/ND/NT, positive BS  Musculoskeletal: trace pitting  Edema bilateral lower extremity  Neuro: aaox3  Data Reviewed: Basic Metabolic Panel:  Recent Labs Lab 12/10/16 1915 12/11/16 0447 12/12/16 0627  NA 139 138 136  K 3.8 4.0 4.5  CL 106 104 105  CO2 25 20* 21*  GLUCOSE 95 192* 117*  BUN 34* 34* 46*  CREATININE 2.82* 2.83* 2.72*  CALCIUM 8.3* 8.5* 8.2*   Liver Function Tests:  Recent Labs Lab 12/11/16 0447  AST 35  ALT 23  ALKPHOS 53  BILITOT 0.4  PROT 8.1  ALBUMIN 3.5   No results for input(s): LIPASE, AMYLASE in the last 168 hours. No results for input(s): AMMONIA in the last 168 hours. CBC:  Recent Labs Lab  12/10/16 1915 12/12/16 0627  WBC 5.8 9.4  NEUTROABS 3.4  --   HGB 10.3* 9.6*  HCT 31.5* 29.6*  MCV 94.0 93.7  PLT 200 202   Cardiac Enzymes:    Recent Labs Lab 12/12/16 0627  CKTOTAL 899*   BNP (last 3 results) No results for input(s): BNP in the last 8760 hours.  ProBNP (last 3 results) No results for input(s): PROBNP in the last 8760 hours.  CBG:  Recent Labs Lab 12/11/16 0736 12/12/16 0722  GLUCAP 171* 115*    No results found for this or any previous visit (from the past 240 hour(s)).   Studies: US Venous Img Lower Bilateral  Result Date: 12/11/2016 CLINICAL DATA:  Bilateral lower extremity edema. EXAM: BILATERAL LOWER EXTREMITY VENOUS DOPPLER ULTRASOUND TECHNIQUE: Gray-scale  sonography with graded compression, as well as color Doppler and duplex ultrasound were performed to evaluate the lower extremity deep venous systems from the level of the common femoral vein and including the common femoral, femoral, profunda femoral, popliteal and calf veins including the posterior tibial, peroneal and gastrocnemius veins when visible. The superficial great saphenous vein was also interrogated. Spectral Doppler was utilized to evaluate flow at rest and with distal augmentation maneuvers in the common femoral, femoral and popliteal veins. COMPARISON:  None. FINDINGS: RIGHT LOWER EXTREMITY Common Femoral Vein: No evidence of thrombus. Normal respiratory phasicity and response to augmentation. Saphenofemoral Junction: No evidence of thrombus. Normal compressibility and flow on color Doppler imaging. Profunda Femoral Vein: No evidence of thrombus. Normal compressibility and flow on color Doppler imaging. Femoral Vein: No evidence of thrombus. Normal compressibility, respiratory phasicity and response to augmentation. Popliteal Vein: No evidence of thrombus. Normal compressibility, respiratory phasicity and response to augmentation. Calf Veins: Visualized right deep calf veins are patent without thrombus. Other: Superficial veins in the right calf demonstrate normal compressibility. LEFT LOWER EXTREMITY Common Femoral Vein: No evidence of thrombus. Normal compressibility, respiratory phasicity and response to augmentation. Saphenofemoral Junction: No evidence of thrombus. Normal compressibility and flow on color Doppler imaging. Profunda Femoral Vein: No evidence of thrombus. Normal compressibility and flow on color Doppler imaging. Femoral Vein: No evidence of thrombus. Normal compressibility, respiratory phasicity and response to augmentation. Popliteal Vein: No evidence of thrombus. Normal compressibility, respiratory phasicity and response to augmentation. Calf Veins: Visualized left deep calf  veins are patent without thrombus. Other: Superficial veins in left calf demonstrate normal compressibility. IMPRESSION: No evidence of deep venous thrombosis in the lower extremities. Electronically Signed   By: Markus Daft M.D.   On: 12/11/2016 17:45    Scheduled Meds: . aspirin EC  81 mg Oral Daily  . azithromycin  500 mg Intravenous Q24H  . budesonide (PULMICORT) nebulizer solution  0.25 mg Nebulization BID  . carvedilol  3.125 mg Oral BID WC  . fluticasone  2 spray Each Nare Daily  . guaiFENesin  600 mg Oral BID  . ipratropium-albuterol  3 mL Nebulization TID  . methylPREDNISolone (SOLU-MEDROL) injection  40 mg Intravenous Q12H  . oseltamivir  30 mg Oral Daily    Continuous Infusions:    Time spent: 30mins  Brantleigh Mifflin MD, PhD  [facility] Pager [phone]. If 7PM-7AM, please contact night-coverage at [email or web address], password [facility] 12/12/2016, 4:15 PM  LOS: 2 days

## 2016-12-13 DIAGNOSIS — N179 Acute kidney failure, unspecified: Secondary | ICD-10-CM

## 2016-12-13 DIAGNOSIS — I10 Essential (primary) hypertension: Secondary | ICD-10-CM

## 2016-12-13 DIAGNOSIS — J208 Acute bronchitis due to other specified organisms: Secondary | ICD-10-CM

## 2016-12-13 DIAGNOSIS — J441 Chronic obstructive pulmonary disease with (acute) exacerbation: Principal | ICD-10-CM

## 2016-12-13 DIAGNOSIS — B9689 Other specified bacterial agents as the cause of diseases classified elsewhere: Secondary | ICD-10-CM

## 2016-12-13 LAB — BASIC METABOLIC PANEL
Anion gap: 9 (ref 5–15)
BUN: 59 mg/dL — AB (ref 6–20)
CO2: 23 mmol/L (ref 22–32)
CREATININE: 2.57 mg/dL — AB (ref 0.61–1.24)
Calcium: 8.7 mg/dL — ABNORMAL LOW (ref 8.9–10.3)
Chloride: 105 mmol/L (ref 101–111)
GFR calc Af Amer: 28 mL/min — ABNORMAL LOW (ref >60)
GFR, EST NON AFRICAN AMERICAN: 24 mL/min — AB (ref >60)
GLUCOSE: 104 mg/dL — AB (ref 65–99)
POTASSIUM: 4.8 mmol/L (ref 3.5–5.1)
SODIUM: 137 mmol/L (ref 135–145)

## 2016-12-13 LAB — CBC
HCT: 31.5 % — ABNORMAL LOW (ref 39.0–52.0)
Hemoglobin: 10.4 g/dL — ABNORMAL LOW (ref 13.0–17.0)
MCH: 31 pg (ref 26.0–34.0)
MCHC: 33 g/dL (ref 30.0–36.0)
MCV: 93.8 fL (ref 78.0–100.0)
PLATELETS: 242 10*3/uL (ref 150–400)
RBC: 3.36 MIL/uL — AB (ref 4.22–5.81)
RDW: 13.3 % (ref 11.5–15.5)
WBC: 11 10*3/uL — ABNORMAL HIGH (ref 4.0–10.5)

## 2016-12-13 LAB — HIV ANTIBODY (ROUTINE TESTING W REFLEX): HIV Screen 4th Generation wRfx: NONREACTIVE

## 2016-12-13 LAB — CK: Total CK: 1194 U/L — ABNORMAL HIGH (ref 49–397)

## 2016-12-13 LAB — GLUCOSE, CAPILLARY: Glucose-Capillary: 110 mg/dL — ABNORMAL HIGH (ref 65–99)

## 2016-12-13 MED ORDER — GUAIFENESIN ER 600 MG PO TB12
1200.0000 mg | ORAL_TABLET | Freq: Two times a day (BID) | ORAL | Status: DC
  Administered 2016-12-13 – 2016-12-14 (×2): 1200 mg via ORAL
  Filled 2016-12-13 (×2): qty 2

## 2016-12-13 MED ORDER — METHYLPREDNISOLONE SODIUM SUCC 125 MG IJ SOLR
60.0000 mg | Freq: Four times a day (QID) | INTRAMUSCULAR | Status: DC
  Administered 2016-12-13 – 2016-12-14 (×4): 60 mg via INTRAVENOUS
  Filled 2016-12-13 (×4): qty 2

## 2016-12-13 MED ORDER — ALBUTEROL SULFATE (2.5 MG/3ML) 0.083% IN NEBU
INHALATION_SOLUTION | RESPIRATORY_TRACT | Status: AC
  Administered 2016-12-13: 2.5 mg
  Filled 2016-12-13: qty 3

## 2016-12-13 MED ORDER — ALBUTEROL SULFATE (2.5 MG/3ML) 0.083% IN NEBU
2.5000 mg | INHALATION_SOLUTION | RESPIRATORY_TRACT | Status: DC | PRN
Start: 2016-12-13 — End: 2016-12-14
  Administered 2016-12-14: 2.5 mg via RESPIRATORY_TRACT
  Filled 2016-12-13: qty 3

## 2016-12-13 MED ORDER — OSELTAMIVIR PHOSPHATE 30 MG PO CAPS
30.0000 mg | ORAL_CAPSULE | Freq: Two times a day (BID) | ORAL | Status: DC
  Administered 2016-12-13 – 2016-12-14 (×2): 30 mg via ORAL
  Filled 2016-12-13 (×2): qty 1

## 2016-12-13 NOTE — Progress Notes (Signed)
PROGRESS NOTE  ROBEL WUERTZ CNO:[id number] DOB: 09-20-1946 DOA: 12/10/2016 PCP: Rosita Fire, MD  HPI/Recap of past 24 hours:  Admitted with Shortness of breath, wheezing. Found to have COPD exacerbation/bronchitis and influenza positive. Feels more short of breath today and has wheezing. Non productive cough.  Assessment/Plan: Principal Problem:   AKI (acute kidney injury) ([place]) Active Problems:   COPD exacerbation (HCC)   Sinus tachycardia   HTN (hypertension)   Paroxysmal atrial flutter (HCC)   Anemia   Acute bacterial bronchitis  Influenza B/Bronchitis/copd exacerbation: On tamiflu, zithromax, nebs/mucinex, IV steroids, feels more short of breath today than yesterday. Continue on treatment for COPD.   Cr elevated, not sure if acute or chronic Cr did not improve with hydration, renal US unremarkable kidneys, ua with few bacteria, small leukocyte esterase, 6-30 wbc,  6-30 rbc Though patient denies urinary symptoms.  He is already on abx May need outpatient nephrology follow up, suspect this is progressive CKD  Bilateral lower extremity edema: report chronic ,   bilateral lower extremity venous US negative for DVT,  Echocardiogram shows normal EF D/c norvasc due to side effect of edema  HTN; patient report he was recently started on norvasc, will d/c norvasc due to edema, try low dose coreg   Normocytic anemia: hgb 10, anemia work up, no need of transfusion, need to follow up with pmd.  hepatosteatosis : incidental finding on renal US, lft wnl, lipid panel shows mild elevation in LDL to 103 with normal total cholesterol, can consider statin therapy. Hepatitis panel in process, patient does not drink alcohol.   Obesity: Body mass index is 35.58 kg/m.  Code Status: full  Family Communication: patient and son at bedside  Disposition Plan: home  likely in 1-2 days   Consultants:  none  Procedures:  none  Antibiotics:  Azithromycin  2/16>>   Objective: BP 126/82 (BP Location: Left Arm)   Pulse 71   Temp 97.7 F (36.5 C) (Oral)   Resp 16   Ht 5\' 10"  (1.778 m)   Wt 112.5 kg (248 lb)   SpO2 92%   BMI 35.58 kg/m   Intake/Output Summary (Last 24 hours) at 12/13/16 1216 Last data filed at 12/12/16 2300  Gross per 24 hour  Intake              980 ml  Output              900 ml  Net               80 ml   Filed Weights   12/11/16 0500 12/12/16 0541 12/13/16 0535  Weight: 117.2 kg (258 lb 6 oz) 115.1 kg (253 lb 12.8 oz) 112.5 kg (248 lb)    Exam:   General:  NAD  Cardiovascular: RRR  Respiratory: mild intermittent wheezing, overall diminished  Abdomen: Soft/ND/NT, positive BS  Musculoskeletal: trace pitting  Edema bilateral lower extremity  Neuro: aaox3  Data Reviewed: Basic Metabolic Panel:  Recent Labs Lab 12/10/16 1915 12/11/16 0447 12/12/16 0627 12/13/16 0652  NA 139 138 136 137  K 3.8 4.0 4.5 4.8  CL 106 104 105 105  CO2 25 20* 21* 23  GLUCOSE 95 192* 117* 104*  BUN 34* 34* 46* 59*  CREATININE 2.82* 2.83* 2.72* 2.57*  CALCIUM 8.3* 8.5* 8.2* 8.7*   Liver Function Tests:  Recent Labs Lab 12/11/16 0447  AST 35  ALT 23  ALKPHOS 53  BILITOT 0.4  PROT 8.1  ALBUMIN 3.5  No results for input(s): LIPASE, AMYLASE in the last 168 hours. No results for input(s): AMMONIA in the last 168 hours. CBC:  Recent Labs Lab 12/10/16 1915 12/12/16 0627 12/13/16 0652  WBC 5.8 9.4 11.0*  NEUTROABS 3.4  --   --   HGB 10.3* 9.6* 10.4*  HCT 31.5* 29.6* 31.5*  MCV 94.0 93.7 93.8  PLT 200 202 242   Cardiac Enzymes:    Recent Labs Lab 12/12/16 0627 12/13/16 0652  CKTOTAL 899* 1,194*   BNP (last 3 results) No results for input(s): BNP in the last 8760 hours.  ProBNP (last 3 results) No results for input(s): PROBNP in the last 8760 hours.  CBG:  Recent Labs Lab 12/11/16 0736 12/12/16 0722 12/13/16 0732  GLUCAP 171* 115* 110*    Recent Results (from the past 240 hour(s))   MRSA PCR Screening     Status: None   Collection Time: 12/11/16  6:05 PM  Result Value Ref Range Status   MRSA by PCR NEGATIVE NEGATIVE Final    Comment:        The GeneXpert MRSA Assay (FDA approved for NASAL specimens only), is one component of a comprehensive MRSA colonization surveillance program. It is not intended to diagnose MRSA infection nor to guide or monitor treatment for MRSA infections.      Studies: No results found.  Scheduled Meds: . aspirin EC  81 mg Oral Daily  . azithromycin  500 mg Intravenous Q24H  . budesonide (PULMICORT) nebulizer solution  0.25 mg Nebulization BID  . carvedilol  3.125 mg Oral BID WC  . fluticasone  2 spray Each Nare Daily  . guaiFENesin  1,200 mg Oral BID  . ipratropium-albuterol  3 mL Nebulization TID  . lidocaine  1 patch Transdermal Q24H  . methylPREDNISolone (SOLU-MEDROL) injection  60 mg Intravenous Q6H  . oseltamivir  30 mg Oral Daily    Continuous Infusions:    Time spent: 53mins  Khia Dieterich MD  [facility] Pager [phone]. If 7PM-7AM, please contact night-coverage at [email or web address], password [facility] 12/13/2016, 12:16 PM  LOS: 3 days

## 2016-12-14 LAB — GLUCOSE, CAPILLARY: GLUCOSE-CAPILLARY: 113 mg/dL — AB (ref 65–99)

## 2016-12-14 LAB — HEPATITIS PANEL, ACUTE
HCV Ab: <0.1 s/co ratio (ref 0.0–0.9)
HEP B C IGM: NEGATIVE
Hep A IgM: NEGATIVE
Hepatitis B Surface Ag: NEGATIVE

## 2016-12-14 MED ORDER — AZITHROMYCIN 250 MG PO TABS: ORAL_TABLET | ORAL | 0 refills | Status: AC

## 2016-12-14 MED ORDER — CARVEDILOL 3.125 MG PO TABS: 3.1250 mg | ORAL_TABLET | Freq: Two times a day (BID) | ORAL | 12 refills | Status: DC

## 2016-12-14 MED ORDER — PREDNISONE 10 MG (21) PO TBPK: ORAL_TABLET | ORAL | 0 refills | Status: DC

## 2016-12-14 MED ORDER — ALBUTEROL SULFATE (2.5 MG/3ML) 0.083% IN NEBU: 2.5000 mg | INHALATION_SOLUTION | Freq: Four times a day (QID) | RESPIRATORY_TRACT | 12 refills | Status: DC | PRN

## 2016-12-14 MED ORDER — OSELTAMIVIR PHOSPHATE 30 MG PO CAPS: 30.0000 mg | ORAL_CAPSULE | Freq: Two times a day (BID) | ORAL | 0 refills | Status: DC

## 2016-12-14 MED ORDER — UMECLIDINIUM BROMIDE 62.5 MCG/INH IN AEPB: 1.0000 | INHALATION_SPRAY | Freq: Every day | RESPIRATORY_TRACT | 12 refills | Status: AC

## 2016-12-14 NOTE — Discharge Summary (Signed)
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[place], MD Admit date: 12/10/2016 Discharge date: 12/14/2016    Discharge Diagnoses:   Principal Problem:   AKI (acute kidney injury) [facility]) Active Problems:   COPD exacerbation ([place])   Sinus tachycardia   HTN (hypertension)   Paroxysmal atrial flutter (HCC)   Anemia   Acute bacterial bronchitis Influenza B Chronic renal failure  Allergies as of 12/14/2016      Reactions   Penicillins Hives, Itching   Has patient had a PCN reaction causing immediate rash, facial/tongue/throat swelling, SOB or lightheadedness with hypotension: no Has patient had a PCN reaction causing severe rash involving mucus membranes or skin necrosis: no Has patient had a PCN reaction that required hospitalization: no Has patient had a PCN reaction occurring within the last 10 years: no If all of the above answers are "NO", then may proceed with Cephalosporin use.      Medication List    STOP taking these medications   albuterol 108 (90 Base) MCG/ACT inhaler Commonly known as:  PROVENTIL HFA;VENTOLIN HFA Replaced by:  albuterol (2.5 MG/3ML) 0.083% nebulizer solution   amLODipine 5 MG tablet Commonly known as:  NORVASC   tiotropium 18 MCG inhalation capsule Commonly known as:  SPIRIVA HANDIHALER     TAKE these medications   acetaminophen 500 MG tablet Commonly known as:  TYLENOL Take 500 mg by mouth every 6 (six) hours as needed. pain   albuterol (2.5 MG/3ML) 0.083% nebulizer solution Commonly known as:  PROVENTIL Take 3 mLs (2.5 mg total) by nebulization every 6 (six) hours as needed for wheezing or shortness of breath. Please provide patient with a nebulizer machine Replaces:  albuterol 108 (90 Base) MCG/ACT inhaler   albuterol 108 (90 Base) MCG/ACT inhaler Commonly known as:  PROVENTIL HFA;VENTOLIN HFA Inhale 2 puffs into the lungs every 6 (six) hours as needed  for wheezing or shortness of breath.   albuterol (2.5 MG/3ML) 0.083% nebulizer solution Commonly known as:  PROVENTIL Take 3 mLs (2.5 mg total) by nebulization every 6 (six) hours as needed for wheezing or shortness of breath.   aspirin 81 MG EC tablet Take 1 tablet (81 mg total) by mouth daily.   azithromycin 250 MG tablet Commonly known as:  ZITHROMAX Z-PAK Take 2 tablets (500 mg) on  Day 1,  followed by 1 tablet (250 mg) once daily on Days 2 through 5.   carvedilol 3.125 MG tablet Commonly known as:  COREG Take 1 tablet (3.125 mg total) by mouth 2 (two) times daily with a meal.   fluticasone 50 MCG/ACT nasal spray Commonly known as:  FLONASE Place 2 sprays into both nostrils daily.   guaiFENesin 600 MG 12 hr tablet Commonly known as:  MUCINEX Take 1 tablet (600 mg total) by mouth 2 (two) times daily.   omeprazole 20 MG capsule Commonly known as:  PRILOSEC Take 1 po BID x 2 weeks then once a day   oseltamivir 30 MG capsule Commonly known as:  TAMIFLU Take 1 capsule (30 mg total) by mouth daily.   oseltamivir 30 MG capsule Commonly known as:  TAMIFLU Take 1 capsule (30 mg total) by mouth 2 (two) times daily.   predniSONE 10 MG (21) Tbpk tablet Commonly known as:  STERAPRED UNI-PAK 21 TAB Take by package instructions   umeclidinium bromide 62.5 MCG/INH Aepb Commonly known as:  INCRUSE ELLIPTA Inhale 1 puff into the lungs daily.  Durable Medical Equipment        Start     Ordered   12/14/16 0000  DME Nebulizer machine    Question:  Patient needs a nebulizer to treat with the following condition  Answer:  COPD (chronic obstructive pulmonary disease) ([place])   12/14/16 0908   12/14/16 0000  For home use only DME oxygen    Question Answer Comment  Mode or (Route) Nasal cannula   Liters per Minute 3   Frequency Continuous (stationary and portable oxygen unit needed)   Oxygen conserving device No   Oxygen delivery system Gas      12/14/16 0908       Discharged Condition:Improved    Consults: None  Significant Diagnostic Studies: Dg Chest 2 View  Result Date: 12/10/2016 CLINICAL DATA:  71 y/o  M; cough, upper back pain, chest soreness. EXAM: CHEST  2 VIEW COMPARISON:  11/09/2014 chest radiograph. FINDINGS: Stable normal cardiac silhouette. Hyperinflated lungs with flattened diaphragms compatible with COPD. No acute osseous abnormality. Bronchitic markings in the lung bases. No focal consolidation or pleural effusion. IMPRESSION: COPD. Bronchitic markings in the lung bases. No focal consolidation or pleural effusion. Electronically Signed   By: Kristine Garbe M.D.   On: 12/10/2016 18:22   US Renal  Result Date: 12/11/2016 CLINICAL DATA:  Elevated creatinine, acute kidney injury EXAM: RENAL / URINARY TRACT ULTRASOUND COMPLETE COMPARISON:  None. FINDINGS: Right Kidney: Length: 10.6 cm. Echogenicity within normal limits. No mass or hydronephrosis visualized. Left Kidney: Length: 11.4 cm. Echogenicity within normal limits. No mass or hydronephrosis visualized. Bladder: Appears normal for degree of bladder distention. Other: Increased hepatic parenchymal echogenicity. IMPRESSION: 1. Normal renal ultrasound. 2. Increased hepatic echogenicity as can be seen with hepatic steatosis. Electronically Signed   By: Kathreen Devoid   On: 12/11/2016 14:29   US Venous Img Lower Bilateral  Result Date: 12/11/2016 CLINICAL DATA:  Bilateral lower extremity edema. EXAM: BILATERAL LOWER EXTREMITY VENOUS DOPPLER ULTRASOUND TECHNIQUE: Gray-scale sonography with graded compression, as well as color Doppler and duplex ultrasound were performed to evaluate the lower extremity deep venous systems from the level of the common femoral vein and including the common femoral, femoral, profunda femoral, popliteal and calf veins including the posterior tibial, peroneal and gastrocnemius veins when visible. The superficial great saphenous vein was also interrogated.  Spectral Doppler was utilized to evaluate flow at rest and with distal augmentation maneuvers in the common femoral, femoral and popliteal veins. COMPARISON:  None. FINDINGS: RIGHT LOWER EXTREMITY Common Femoral Vein: No evidence of thrombus. Normal respiratory phasicity and response to augmentation. Saphenofemoral Junction: No evidence of thrombus. Normal compressibility and flow on color Doppler imaging. Profunda Femoral Vein: No evidence of thrombus. Normal compressibility and flow on color Doppler imaging. Femoral Vein: No evidence of thrombus. Normal compressibility, respiratory phasicity and response to augmentation. Popliteal Vein: No evidence of thrombus. Normal compressibility, respiratory phasicity and response to augmentation. Calf Veins: Visualized right deep calf veins are patent without thrombus. Other: Superficial veins in the right calf demonstrate normal compressibility. LEFT LOWER EXTREMITY Common Femoral Vein: No evidence of thrombus. Normal compressibility, respiratory phasicity and response to augmentation. Saphenofemoral Junction: No evidence of thrombus. Normal compressibility and flow on color Doppler imaging. Profunda Femoral Vein: No evidence of thrombus. Normal compressibility and flow on color Doppler imaging. Femoral Vein: No evidence of thrombus. Normal compressibility, respiratory phasicity and response to augmentation. Popliteal Vein: No evidence of thrombus. Normal compressibility, respiratory phasicity and response to augmentation. Calf Veins: Visualized left  deep calf veins are patent without thrombus. Other: Superficial veins in left calf demonstrate normal compressibility. IMPRESSION: No evidence of deep venous thrombosis in the lower extremities. Electronically Signed   By: Markus Daft M.D.   On: 12/11/2016 17:45    Lab Results: Basic Metabolic Panel:  Recent Labs  12/12/16 0627 12/13/16 0652  NA 136 137  K 4.5 4.8  CL 105 105  CO2 21* 23  GLUCOSE 117* 104*  BUN  46* 59*  CREATININE 2.72* 2.57*  CALCIUM 8.2* 8.7*   Liver Function Tests: No results for input(s): AST, ALT, ALKPHOS, BILITOT, PROT, ALBUMIN in the last 72 hours.   CBC:  Recent Labs  12/12/16 0627 12/13/16 0652  WBC 9.4 11.0*  HGB 9.6* 10.4*  HCT 29.6* 31.5*  MCV 93.7 93.8  PLT 202 242    Recent Results (from the past 240 hour(s))  MRSA PCR Screening     Status: None   Collection Time: 12/11/16  6:05 PM  Result Value Ref Range Status   MRSA by PCR NEGATIVE NEGATIVE Final    Comment:        The GeneXpert MRSA Assay (FDA approved for NASAL specimens only), is one component of a comprehensive MRSA colonization surveillance program. It is not intended to diagnose MRSA infection nor to guide or monitor treatment for MRSA infections.      Hospital Course: This is a 71 year old who has a long known history of COPD. He was in his usual state of health when he developed increasing shortness of breath and subjective fever chills aching all over. He came to the emergency department and was diagnosed with COPD exacerbation and was found also to have influenza B. He had what appeared to be acute kidney injury but this has not improved with fluids. He may have some element of chronic renal failure. His renal ultrasound did not show typical appearance of medical kidney issues. There was no obstruction. He improved over the next several days and wanted to go home. He does want to be on oxygen and nebulizer and these have been arranged.  Discharge Exam: Blood pressure (!) 142/89, pulse 75, temperature 97.5 F (36.4 C), temperature source Oral, resp. rate 14, height 5\' 10"  (1.778 m), weight 110.8 kg (244 lb 4.8 oz), SpO2 98 %. He's awake and alert. Chest is clear now. Heart is not regular.  Disposition: Home with home health services and he will need outpatient appointment to nephrology  Discharge Instructions    DME Nebulizer machine    Complete by:  As directed    Patient needs a  nebulizer to treat with the following condition:  COPD (chronic obstructive pulmonary disease) ([place])   Face-to-face encounter (required for Medicare/Medicaid patients)    Complete by:  As directed    I Lucerito Rosinski L certify that this patient is under my care and that I, or a nurse practitioner or physician's assistant working with me, had a face-to-face encounter that meets the physician face-to-face encounter requirements with this patient on 12/14/2016. The encounter with the patient was in whole, or in part for the following medical condition(s) which is the primary reason for home health care (List medical condition): influenza/copd exacerbation   The encounter with the patient was in whole, or in part, for the following medical condition, which is the primary reason for home health care:  influenza copd exacerbation   I certify that, based on my findings, the following services are medically necessary home health services:  Nursing  Reason for Medically Necessary Home Health Services:  Skilled Nursing- Change/Decline in Patient Status   My clinical findings support the need for the above services:  Shortness of breath with activity   Further, I certify that my clinical findings support that this patient is homebound due to:  Shortness of Breath with activity   For home use only DME oxygen    Complete by:  As directed    Mode or (Route):  Nasal cannula   Liters per Minute:  3   Frequency:  Continuous (stationary and portable oxygen unit needed)   Oxygen conserving device:  No   Oxygen delivery system:  Gas   Home Health    Complete by:  As directed    To provide the following care/treatments:   RN Home Health Aide        Follow-up Information    FANTA,TESFAYE, MD Follow up in 1 week(s).   Specialty:  Internal Medicine Why:  hospital discharge follow up, repeat cbc/bmp at follow up. monitor hgb and cr level. pmd to refer you to nephrology for elevated cr and pulmonology for copd  /lung function test. Contact information: [place] [address] [phone]           Signed: Jazmine Longshore L   12/14/2016, 9:09 AM

## 2016-12-14 NOTE — Care Management Note (Signed)
Case Management Note  Patient Details  Name: Darren Burke MRN: [id number] Date of Birth: 1946-01-29    Expected Discharge Date:  12/14/16               Expected Discharge Plan:  [place]  In-House Referral:  NA  Discharge planning Services  CM Consult  Post Acute Care Choice:  Durable Medical Equipment, Home Health Choice offered to:  Patient  DME Arranged:  Nebulizer machine DME Agency:  [place] Arranged:  RN [facility] Agency:  [place]  Status of Service:  Completed, signed off  If discussed at [place] of Stay Meetings, dates discussed:    Additional Comments: Patient discharging home today. Will need HH RN and neb machine. Per previous note, patient does not qualify for oxygen, RN to reassess again today. Offered choice of [place] agencies. Patient would like to use [facility] for [facility] and DME. Neb machine will be delivered to room prior to discharge. He is aware that [facility] will initiate services in 48 hours.   Keniah Klemmer, Chauncey Reading, RN 12/14/2016, 10:11 AM

## 2016-12-14 NOTE — Progress Notes (Signed)
Pt discharged home today per Dr. Hawkins. Pt's IV site D/C'd and WDL. Pt's VSS. Pt provided with home medication list, discharge instructions and prescriptions. Verbalized understanding. Pt left floor via WC in stable condition accompanied by NT. 

## 2016-12-14 NOTE — Progress Notes (Signed)
He says he feels much better and wants to go home. He has no new complaints. He does not have a nebulizer or oxygen at home.

## 2016-12-14 NOTE — Care Management Important Message (Signed)
Important Message  Patient Details  Name: Darren Burke MRN: [id number] Date of Birth: 1946-01-19   Medicare Important Message Given:  Yes    Yanelie Abraha, Chauncey [place], RN 12/14/2016, 9:41 AM

## 2016-12-16 DIAGNOSIS — J111 Influenza due to unidentified influenza virus with other respiratory manifestations: Secondary | ICD-10-CM | POA: Diagnosis not present

## 2016-12-16 DIAGNOSIS — J441 Chronic obstructive pulmonary disease with (acute) exacerbation: Secondary | ICD-10-CM | POA: Diagnosis not present

## 2016-12-16 DIAGNOSIS — J208 Acute bronchitis due to other specified organisms: Secondary | ICD-10-CM | POA: Diagnosis not present

## 2016-12-16 DIAGNOSIS — I1310 Hypertensive heart and chronic kidney disease without heart failure, with stage 1 through stage 4 chronic kidney disease, or unspecified chronic kidney disease: Secondary | ICD-10-CM | POA: Diagnosis not present

## 2016-12-16 DIAGNOSIS — J440 Chronic obstructive pulmonary disease with acute lower respiratory infection: Secondary | ICD-10-CM | POA: Diagnosis not present

## 2016-12-16 DIAGNOSIS — Z6835 Body mass index (BMI) 35.0-35.9, adult: Secondary | ICD-10-CM | POA: Diagnosis not present

## 2016-12-16 DIAGNOSIS — N183 Chronic kidney disease, stage 3 (moderate): Secondary | ICD-10-CM | POA: Diagnosis not present

## 2016-12-16 DIAGNOSIS — I4892 Unspecified atrial flutter: Secondary | ICD-10-CM | POA: Diagnosis not present

## 2016-12-16 DIAGNOSIS — E669 Obesity, unspecified: Secondary | ICD-10-CM | POA: Diagnosis not present

## 2016-12-17 DIAGNOSIS — D649 Anemia, unspecified: Secondary | ICD-10-CM | POA: Diagnosis not present

## 2016-12-17 DIAGNOSIS — I10 Essential (primary) hypertension: Secondary | ICD-10-CM | POA: Diagnosis not present

## 2016-12-17 DIAGNOSIS — N184 Chronic kidney disease, stage 4 (severe): Secondary | ICD-10-CM | POA: Diagnosis not present

## 2016-12-17 DIAGNOSIS — R809 Proteinuria, unspecified: Secondary | ICD-10-CM | POA: Diagnosis not present

## 2016-12-22 DIAGNOSIS — I1310 Hypertensive heart and chronic kidney disease without heart failure, with stage 1 through stage 4 chronic kidney disease, or unspecified chronic kidney disease: Secondary | ICD-10-CM | POA: Diagnosis not present

## 2016-12-22 DIAGNOSIS — J208 Acute bronchitis due to other specified organisms: Secondary | ICD-10-CM | POA: Diagnosis not present

## 2016-12-22 DIAGNOSIS — J441 Chronic obstructive pulmonary disease with (acute) exacerbation: Secondary | ICD-10-CM | POA: Diagnosis not present

## 2016-12-22 DIAGNOSIS — I4892 Unspecified atrial flutter: Secondary | ICD-10-CM | POA: Diagnosis not present

## 2016-12-22 DIAGNOSIS — J111 Influenza due to unidentified influenza virus with other respiratory manifestations: Secondary | ICD-10-CM | POA: Diagnosis not present

## 2016-12-22 DIAGNOSIS — J440 Chronic obstructive pulmonary disease with acute lower respiratory infection: Secondary | ICD-10-CM | POA: Diagnosis not present

## 2016-12-24 DIAGNOSIS — J441 Chronic obstructive pulmonary disease with (acute) exacerbation: Secondary | ICD-10-CM | POA: Diagnosis not present

## 2016-12-24 DIAGNOSIS — J208 Acute bronchitis due to other specified organisms: Secondary | ICD-10-CM | POA: Diagnosis not present

## 2016-12-24 DIAGNOSIS — J111 Influenza due to unidentified influenza virus with other respiratory manifestations: Secondary | ICD-10-CM | POA: Diagnosis not present

## 2016-12-24 DIAGNOSIS — I1310 Hypertensive heart and chronic kidney disease without heart failure, with stage 1 through stage 4 chronic kidney disease, or unspecified chronic kidney disease: Secondary | ICD-10-CM | POA: Diagnosis not present

## 2016-12-24 DIAGNOSIS — I4892 Unspecified atrial flutter: Secondary | ICD-10-CM | POA: Diagnosis not present

## 2016-12-24 DIAGNOSIS — J440 Chronic obstructive pulmonary disease with acute lower respiratory infection: Secondary | ICD-10-CM | POA: Diagnosis not present

## 2016-12-29 DIAGNOSIS — I10 Essential (primary) hypertension: Secondary | ICD-10-CM | POA: Diagnosis not present

## 2016-12-29 DIAGNOSIS — J449 Chronic obstructive pulmonary disease, unspecified: Secondary | ICD-10-CM | POA: Diagnosis not present

## 2016-12-29 DIAGNOSIS — N184 Chronic kidney disease, stage 4 (severe): Secondary | ICD-10-CM | POA: Diagnosis not present

## 2016-12-29 DIAGNOSIS — Z6832 Body mass index (BMI) 32.0-32.9, adult: Secondary | ICD-10-CM | POA: Diagnosis not present

## 2016-12-31 DIAGNOSIS — J208 Acute bronchitis due to other specified organisms: Secondary | ICD-10-CM | POA: Diagnosis not present

## 2016-12-31 DIAGNOSIS — I4892 Unspecified atrial flutter: Secondary | ICD-10-CM | POA: Diagnosis not present

## 2016-12-31 DIAGNOSIS — I1310 Hypertensive heart and chronic kidney disease without heart failure, with stage 1 through stage 4 chronic kidney disease, or unspecified chronic kidney disease: Secondary | ICD-10-CM | POA: Diagnosis not present

## 2016-12-31 DIAGNOSIS — J111 Influenza due to unidentified influenza virus with other respiratory manifestations: Secondary | ICD-10-CM | POA: Diagnosis not present

## 2016-12-31 DIAGNOSIS — J440 Chronic obstructive pulmonary disease with acute lower respiratory infection: Secondary | ICD-10-CM | POA: Diagnosis not present

## 2016-12-31 DIAGNOSIS — J441 Chronic obstructive pulmonary disease with (acute) exacerbation: Secondary | ICD-10-CM | POA: Diagnosis not present

## 2017-01-01 DIAGNOSIS — J440 Chronic obstructive pulmonary disease with acute lower respiratory infection: Secondary | ICD-10-CM | POA: Diagnosis not present

## 2017-01-08 DIAGNOSIS — I1310 Hypertensive heart and chronic kidney disease without heart failure, with stage 1 through stage 4 chronic kidney disease, or unspecified chronic kidney disease: Secondary | ICD-10-CM | POA: Diagnosis not present

## 2017-01-08 DIAGNOSIS — J208 Acute bronchitis due to other specified organisms: Secondary | ICD-10-CM | POA: Diagnosis not present

## 2017-01-08 DIAGNOSIS — I4892 Unspecified atrial flutter: Secondary | ICD-10-CM | POA: Diagnosis not present

## 2017-01-08 DIAGNOSIS — J440 Chronic obstructive pulmonary disease with acute lower respiratory infection: Secondary | ICD-10-CM | POA: Diagnosis not present

## 2017-01-08 DIAGNOSIS — J111 Influenza due to unidentified influenza virus with other respiratory manifestations: Secondary | ICD-10-CM | POA: Diagnosis not present

## 2017-01-08 DIAGNOSIS — J441 Chronic obstructive pulmonary disease with (acute) exacerbation: Secondary | ICD-10-CM | POA: Diagnosis not present

## 2017-01-14 DIAGNOSIS — J440 Chronic obstructive pulmonary disease with acute lower respiratory infection: Secondary | ICD-10-CM | POA: Diagnosis not present

## 2017-01-14 DIAGNOSIS — I1310 Hypertensive heart and chronic kidney disease without heart failure, with stage 1 through stage 4 chronic kidney disease, or unspecified chronic kidney disease: Secondary | ICD-10-CM | POA: Diagnosis not present

## 2017-01-14 DIAGNOSIS — J441 Chronic obstructive pulmonary disease with (acute) exacerbation: Secondary | ICD-10-CM | POA: Diagnosis not present

## 2017-01-14 DIAGNOSIS — I4892 Unspecified atrial flutter: Secondary | ICD-10-CM | POA: Diagnosis not present

## 2017-01-14 DIAGNOSIS — J111 Influenza due to unidentified influenza virus with other respiratory manifestations: Secondary | ICD-10-CM | POA: Diagnosis not present

## 2017-01-14 DIAGNOSIS — J208 Acute bronchitis due to other specified organisms: Secondary | ICD-10-CM | POA: Diagnosis not present

## 2017-01-15 DIAGNOSIS — Z79899 Other long term (current) drug therapy: Secondary | ICD-10-CM | POA: Diagnosis not present

## 2017-01-15 DIAGNOSIS — D519 Vitamin B12 deficiency anemia, unspecified: Secondary | ICD-10-CM | POA: Diagnosis not present

## 2017-01-15 DIAGNOSIS — R809 Proteinuria, unspecified: Secondary | ICD-10-CM | POA: Diagnosis not present

## 2017-01-15 DIAGNOSIS — N183 Chronic kidney disease, stage 3 (moderate): Secondary | ICD-10-CM | POA: Diagnosis not present

## 2017-01-15 DIAGNOSIS — E559 Vitamin D deficiency, unspecified: Secondary | ICD-10-CM | POA: Diagnosis not present

## 2017-01-15 DIAGNOSIS — I10 Essential (primary) hypertension: Secondary | ICD-10-CM | POA: Diagnosis not present

## 2017-01-15 DIAGNOSIS — D509 Iron deficiency anemia, unspecified: Secondary | ICD-10-CM | POA: Diagnosis not present

## 2017-01-20 DIAGNOSIS — J440 Chronic obstructive pulmonary disease with acute lower respiratory infection: Secondary | ICD-10-CM | POA: Diagnosis not present

## 2017-01-20 DIAGNOSIS — I4892 Unspecified atrial flutter: Secondary | ICD-10-CM | POA: Diagnosis not present

## 2017-01-20 DIAGNOSIS — J111 Influenza due to unidentified influenza virus with other respiratory manifestations: Secondary | ICD-10-CM | POA: Diagnosis not present

## 2017-01-20 DIAGNOSIS — I1310 Hypertensive heart and chronic kidney disease without heart failure, with stage 1 through stage 4 chronic kidney disease, or unspecified chronic kidney disease: Secondary | ICD-10-CM | POA: Diagnosis not present

## 2017-01-20 DIAGNOSIS — J441 Chronic obstructive pulmonary disease with (acute) exacerbation: Secondary | ICD-10-CM | POA: Diagnosis not present

## 2017-01-20 DIAGNOSIS — J208 Acute bronchitis due to other specified organisms: Secondary | ICD-10-CM | POA: Diagnosis not present

## 2017-01-29 DIAGNOSIS — J111 Influenza due to unidentified influenza virus with other respiratory manifestations: Secondary | ICD-10-CM | POA: Diagnosis not present

## 2017-01-29 DIAGNOSIS — I4892 Unspecified atrial flutter: Secondary | ICD-10-CM | POA: Diagnosis not present

## 2017-01-29 DIAGNOSIS — J208 Acute bronchitis due to other specified organisms: Secondary | ICD-10-CM | POA: Diagnosis not present

## 2017-01-29 DIAGNOSIS — I1310 Hypertensive heart and chronic kidney disease without heart failure, with stage 1 through stage 4 chronic kidney disease, or unspecified chronic kidney disease: Secondary | ICD-10-CM | POA: Diagnosis not present

## 2017-01-29 DIAGNOSIS — J441 Chronic obstructive pulmonary disease with (acute) exacerbation: Secondary | ICD-10-CM | POA: Diagnosis not present

## 2017-01-29 DIAGNOSIS — J440 Chronic obstructive pulmonary disease with acute lower respiratory infection: Secondary | ICD-10-CM | POA: Diagnosis not present

## 2017-02-12 DIAGNOSIS — J111 Influenza due to unidentified influenza virus with other respiratory manifestations: Secondary | ICD-10-CM | POA: Diagnosis not present

## 2017-02-12 DIAGNOSIS — I1310 Hypertensive heart and chronic kidney disease without heart failure, with stage 1 through stage 4 chronic kidney disease, or unspecified chronic kidney disease: Secondary | ICD-10-CM | POA: Diagnosis not present

## 2017-02-12 DIAGNOSIS — I4892 Unspecified atrial flutter: Secondary | ICD-10-CM | POA: Diagnosis not present

## 2017-02-12 DIAGNOSIS — J441 Chronic obstructive pulmonary disease with (acute) exacerbation: Secondary | ICD-10-CM | POA: Diagnosis not present

## 2017-02-12 DIAGNOSIS — J208 Acute bronchitis due to other specified organisms: Secondary | ICD-10-CM | POA: Diagnosis not present

## 2017-02-12 DIAGNOSIS — J440 Chronic obstructive pulmonary disease with acute lower respiratory infection: Secondary | ICD-10-CM | POA: Diagnosis not present

## 2017-05-19 ENCOUNTER — Ambulatory Visit: Payer: Medicare Other | Admitting: Urology

## 2017-11-24 ENCOUNTER — Emergency Department (HOSPITAL_COMMUNITY)
Admission: EM | Admit: 2017-11-24 | Discharge: 2017-11-24 | Disposition: A | Discharge status: Home / Self Care | Payer: Medicare Other | Source: Home / Self Care | Attending: Emergency Medicine | Admitting: Emergency Medicine

## 2017-11-24 ENCOUNTER — Encounter (HOSPITAL_COMMUNITY): Payer: Self-pay | Admitting: Emergency Medicine

## 2017-11-24 ENCOUNTER — Emergency Department (HOSPITAL_COMMUNITY): Payer: Medicare Other

## 2017-11-24 ENCOUNTER — Inpatient Hospital Stay (HOSPITAL_COMMUNITY)
Admission: EM | Admit: 2017-11-24 | Discharge: 2017-11-27 | DRG: 190 | Disposition: A | Discharge status: Home / Self Care | Payer: Medicare Other | Attending: Internal Medicine | Admitting: Internal Medicine

## 2017-11-24 ENCOUNTER — Encounter (HOSPITAL_COMMUNITY): Payer: Self-pay | Admitting: *Deleted

## 2017-11-24 ENCOUNTER — Other Ambulatory Visit: Payer: Self-pay

## 2017-11-24 DIAGNOSIS — F419 Anxiety disorder, unspecified: Secondary | ICD-10-CM | POA: Diagnosis not present

## 2017-11-24 DIAGNOSIS — J209 Acute bronchitis, unspecified: Secondary | ICD-10-CM | POA: Diagnosis present

## 2017-11-24 DIAGNOSIS — Z880 Allergy status to penicillin: Secondary | ICD-10-CM | POA: Diagnosis not present

## 2017-11-24 DIAGNOSIS — R7302 Impaired glucose tolerance (oral): Secondary | ICD-10-CM | POA: Diagnosis present

## 2017-11-24 DIAGNOSIS — B9689 Other specified bacterial agents as the cause of diseases classified elsewhere: Secondary | ICD-10-CM | POA: Diagnosis present

## 2017-11-24 DIAGNOSIS — D631 Anemia in chronic kidney disease: Secondary | ICD-10-CM | POA: Diagnosis present

## 2017-11-24 DIAGNOSIS — K219 Gastro-esophageal reflux disease without esophagitis: Secondary | ICD-10-CM | POA: Diagnosis present

## 2017-11-24 DIAGNOSIS — R069 Unspecified abnormalities of breathing: Secondary | ICD-10-CM | POA: Diagnosis not present

## 2017-11-24 DIAGNOSIS — T50905A Adverse effect of unspecified drugs, medicaments and biological substances, initial encounter: Secondary | ICD-10-CM

## 2017-11-24 DIAGNOSIS — R0602 Shortness of breath: Secondary | ICD-10-CM | POA: Diagnosis not present

## 2017-11-24 DIAGNOSIS — R0902 Hypoxemia: Secondary | ICD-10-CM | POA: Diagnosis not present

## 2017-11-24 DIAGNOSIS — Z79899 Other long term (current) drug therapy: Secondary | ICD-10-CM

## 2017-11-24 DIAGNOSIS — J441 Chronic obstructive pulmonary disease with (acute) exacerbation: Principal | ICD-10-CM | POA: Diagnosis present

## 2017-11-24 DIAGNOSIS — Z87891 Personal history of nicotine dependence: Secondary | ICD-10-CM

## 2017-11-24 DIAGNOSIS — J9601 Acute respiratory failure with hypoxia: Secondary | ICD-10-CM | POA: Diagnosis present

## 2017-11-24 DIAGNOSIS — J208 Acute bronchitis due to other specified organisms: Secondary | ICD-10-CM

## 2017-11-24 DIAGNOSIS — Z7982 Long term (current) use of aspirin: Secondary | ICD-10-CM | POA: Insufficient documentation

## 2017-11-24 DIAGNOSIS — R739 Hyperglycemia, unspecified: Secondary | ICD-10-CM | POA: Diagnosis present

## 2017-11-24 DIAGNOSIS — N390 Urinary tract infection, site not specified: Secondary | ICD-10-CM | POA: Diagnosis present

## 2017-11-24 DIAGNOSIS — N183 Chronic kidney disease, stage 3 (moderate): Secondary | ICD-10-CM

## 2017-11-24 DIAGNOSIS — D649 Anemia, unspecified: Secondary | ICD-10-CM | POA: Diagnosis present

## 2017-11-24 DIAGNOSIS — K5900 Constipation, unspecified: Secondary | ICD-10-CM | POA: Insufficient documentation

## 2017-11-24 DIAGNOSIS — I129 Hypertensive chronic kidney disease with stage 1 through stage 4 chronic kidney disease, or unspecified chronic kidney disease: Secondary | ICD-10-CM | POA: Diagnosis present

## 2017-11-24 DIAGNOSIS — R140 Abdominal distension (gaseous): Secondary | ICD-10-CM

## 2017-11-24 DIAGNOSIS — J440 Chronic obstructive pulmonary disease with acute lower respiratory infection: Secondary | ICD-10-CM | POA: Diagnosis present

## 2017-11-24 DIAGNOSIS — I10 Essential (primary) hypertension: Secondary | ICD-10-CM

## 2017-11-24 DIAGNOSIS — N184 Chronic kidney disease, stage 4 (severe): Secondary | ICD-10-CM | POA: Diagnosis present

## 2017-11-24 DIAGNOSIS — R062 Wheezing: Secondary | ICD-10-CM | POA: Diagnosis not present

## 2017-11-24 DIAGNOSIS — R9431 Abnormal electrocardiogram [ECG] [EKG]: Secondary | ICD-10-CM | POA: Diagnosis not present

## 2017-11-24 HISTORY — DX: Unspecified atrial flutter: I48.92

## 2017-11-24 HISTORY — DX: Essential (primary) hypertension: I10

## 2017-11-24 HISTORY — DX: Gastro-esophageal reflux disease without esophagitis: K21.9

## 2017-11-24 LAB — BASIC METABOLIC PANEL
Anion gap: 14 (ref 5–15)
BUN: 28 mg/dL — AB (ref 6–20)
CALCIUM: 9.3 mg/dL (ref 8.9–10.3)
CO2: 23 mmol/L (ref 22–32)
CREATININE: 2.14 mg/dL — AB (ref 0.61–1.24)
Chloride: 104 mmol/L (ref 101–111)
GFR calc non Af Amer: 30 mL/min — ABNORMAL LOW (ref >60)
GFR, EST AFRICAN AMERICAN: 35 mL/min — AB (ref >60)
GLUCOSE: 123 mg/dL — AB (ref 65–99)
Potassium: 3.9 mmol/L (ref 3.5–5.1)
Sodium: 141 mmol/L (ref 135–145)

## 2017-11-24 LAB — URINALYSIS, ROUTINE W REFLEX MICROSCOPIC
Bilirubin Urine: NEGATIVE
GLUCOSE, UA: NEGATIVE mg/dL
KETONES UR: NEGATIVE mg/dL
NITRITE: NEGATIVE
PROTEIN: 100 mg/dL — AB
Specific Gravity, Urine: 1.014 (ref 1.005–1.030)
pH: 5 (ref 5.0–8.0)

## 2017-11-24 LAB — CBC WITH DIFFERENTIAL/PLATELET
Basophils Absolute: 0 10*3/uL (ref 0.0–0.1)
Basophils Relative: 0 %
EOS PCT: 0 %
Eosinophils Absolute: 0 10*3/uL (ref 0.0–0.7)
HEMATOCRIT: 38.4 % — AB (ref 39.0–52.0)
Hemoglobin: 12.2 g/dL — ABNORMAL LOW (ref 13.0–17.0)
LYMPHS ABS: 1.2 10*3/uL (ref 0.7–4.0)
LYMPHS PCT: 8 %
MCH: 30.4 pg (ref 26.0–34.0)
MCHC: 31.8 g/dL (ref 30.0–36.0)
MCV: 95.8 fL (ref 78.0–100.0)
MONO ABS: 1 10*3/uL (ref 0.1–1.0)
Monocytes Relative: 6 %
NEUTROS ABS: 13.8 10*3/uL — AB (ref 1.7–7.7)
Neutrophils Relative %: 86 %
Platelets: 262 10*3/uL (ref 150–400)
RBC: 4.01 MIL/uL — ABNORMAL LOW (ref 4.22–5.81)
RDW: 12.4 % (ref 11.5–15.5)
WBC: 16 10*3/uL — ABNORMAL HIGH (ref 4.0–10.5)

## 2017-11-24 LAB — BRAIN NATRIURETIC PEPTIDE: B NATRIURETIC PEPTIDE 5: 62 pg/mL (ref 0.0–100.0)

## 2017-11-24 LAB — TROPONIN I: Troponin I: <0.03 ng/mL (ref <0.03)

## 2017-11-24 MED ORDER — UMECLIDINIUM BROMIDE 62.5 MCG/INH IN AEPB
1.0000 | INHALATION_SPRAY | Freq: Every day | RESPIRATORY_TRACT | Status: DC
  Filled 2017-11-24: qty 7

## 2017-11-24 MED ORDER — IPRATROPIUM-ALBUTEROL 0.5-2.5 (3) MG/3ML IN SOLN
3.0000 mL | Freq: Four times a day (QID) | RESPIRATORY_TRACT | Status: DC
  Administered 2017-11-24 – 2017-11-27 (×11): 3 mL via RESPIRATORY_TRACT
  Filled 2017-11-24 (×11): qty 3

## 2017-11-24 MED ORDER — FLUTICASONE PROPIONATE 50 MCG/ACT NA SUSP
2.0000 | Freq: Every day | NASAL | Status: DC
  Administered 2017-11-24 – 2017-11-26 (×3): 2 via NASAL
  Filled 2017-11-24: qty 16

## 2017-11-24 MED ORDER — GUAIFENESIN ER 600 MG PO TB12
600.0000 mg | ORAL_TABLET | Freq: Two times a day (BID) | ORAL | Status: DC
  Administered 2017-11-24: 600 mg via ORAL
  Filled 2017-11-24 (×5): qty 1

## 2017-11-24 MED ORDER — ACETAMINOPHEN 325 MG PO TABS: 650.0000 mg | ORAL_TABLET | Freq: Four times a day (QID) | ORAL | Status: DC | PRN

## 2017-11-24 MED ORDER — ALBUTEROL (5 MG/ML) CONTINUOUS INHALATION SOLN
10.0000 mg/h | INHALATION_SOLUTION | Freq: Once | RESPIRATORY_TRACT | Status: AC
  Administered 2017-11-24: 10 mg/h via RESPIRATORY_TRACT
  Filled 2017-11-24: qty 20

## 2017-11-24 MED ORDER — SODIUM CHLORIDE 0.9% FLUSH: 3.0000 mL | INTRAVENOUS | Status: DC | PRN

## 2017-11-24 MED ORDER — METHYLPREDNISOLONE SODIUM SUCC 125 MG IJ SOLR
125.0000 mg | Freq: Once | INTRAMUSCULAR | Status: AC
  Administered 2017-11-24: 125 mg via INTRAVENOUS
  Filled 2017-11-24: qty 2

## 2017-11-24 MED ORDER — GUAIFENESIN ER 600 MG PO TB12
600.0000 mg | ORAL_TABLET | Freq: Two times a day (BID) | ORAL | Status: DC
  Administered 2017-11-24 – 2017-11-27 (×6): 600 mg via ORAL
  Filled 2017-11-24 (×6): qty 1

## 2017-11-24 MED ORDER — PANTOPRAZOLE SODIUM 40 MG PO TBEC
40.0000 mg | DELAYED_RELEASE_TABLET | Freq: Every day | ORAL | Status: DC
  Administered 2017-11-24 – 2017-11-27 (×4): 40 mg via ORAL
  Filled 2017-11-24 (×5): qty 1

## 2017-11-24 MED ORDER — CARVEDILOL 3.125 MG PO TABS
3.1250 mg | ORAL_TABLET | Freq: Two times a day (BID) | ORAL | Status: DC
  Administered 2017-11-24 – 2017-11-27 (×6): 3.125 mg via ORAL
  Filled 2017-11-24 (×6): qty 1

## 2017-11-24 MED ORDER — SODIUM CHLORIDE 0.9 % IV SOLN: 250.0000 mL | INTRAVENOUS | Status: DC | PRN

## 2017-11-24 MED ORDER — ENOXAPARIN SODIUM 40 MG/0.4ML ~~LOC~~ SOLN
40.0000 mg | SUBCUTANEOUS | Status: DC
  Administered 2017-11-24 – 2017-11-26 (×3): 40 mg via SUBCUTANEOUS
  Filled 2017-11-24 (×3): qty 0.4

## 2017-11-24 MED ORDER — SODIUM CHLORIDE 0.9% FLUSH
3.0000 mL | Freq: Two times a day (BID) | INTRAVENOUS | Status: DC
  Administered 2017-11-24 – 2017-11-27 (×6): 3 mL via INTRAVENOUS

## 2017-11-24 MED ORDER — CEFTRIAXONE SODIUM 1 G IJ SOLR
1.0000 g | INTRAMUSCULAR | Status: DC
  Administered 2017-11-24 – 2017-11-25 (×2): 1 g via INTRAVENOUS
  Filled 2017-11-24 (×5): qty 10

## 2017-11-24 MED ORDER — ALBUTEROL SULFATE (2.5 MG/3ML) 0.083% IN NEBU
5.0000 mg | INHALATION_SOLUTION | Freq: Once | RESPIRATORY_TRACT | Status: AC
  Administered 2017-11-24: 5 mg via RESPIRATORY_TRACT
  Filled 2017-11-24: qty 6

## 2017-11-24 MED ORDER — IPRATROPIUM BROMIDE 0.02 % IN SOLN
1.0000 mg | Freq: Once | RESPIRATORY_TRACT | Status: AC
  Administered 2017-11-24: 1 mg via RESPIRATORY_TRACT
  Filled 2017-11-24: qty 5

## 2017-11-24 MED ORDER — DEXTROSE 5 % IV SOLN
500.0000 mg | INTRAVENOUS | Status: DC
  Administered 2017-11-24 – 2017-11-25 (×2): 500 mg via INTRAVENOUS
  Filled 2017-11-24 (×5): qty 500

## 2017-11-24 MED ORDER — PREDNISONE 20 MG PO TABS: 40.0000 mg | ORAL_TABLET | Freq: Every day | ORAL | Status: DC

## 2017-11-24 MED ORDER — ASPIRIN EC 81 MG PO TBEC
81.0000 mg | DELAYED_RELEASE_TABLET | Freq: Every day | ORAL | Status: DC
  Administered 2017-11-24 – 2017-11-27 (×4): 81 mg via ORAL
  Filled 2017-11-24 (×5): qty 1

## 2017-11-24 MED ORDER — LORAZEPAM 2 MG/ML IJ SOLN
0.5000 mg | Freq: Once | INTRAMUSCULAR | Status: AC
  Administered 2017-11-24: 0.5 mg via INTRAVENOUS
  Filled 2017-11-24: qty 1

## 2017-11-24 NOTE — ED Provider Notes (Signed)
[facility] EMERGENCY DEPARTMENT Provider Note   CSN: [id number] Arrival date & time: 11/24/17  0130  Time seen 01:48 AM    History   Chief Complaint Chief Complaint  Patient presents with  . Shortness of Breath    HPI Darren Burke is a 72 y.o. male.  HPI patient states he was fine until about 12 midnight, he states he was laying in bed and then he tried to cough.  He states he had difficulty because he felt like he could not breathe because his stomach felt tight.  He relates that to anxiety.  He states he was wheezing and he used his inhaler which helped a little bit.  He was brought to the emergency department by EMS who he reports did not do any treatments.  He states he does not use oxygen at home.  He states he has a lot of anxiety and his daughter tells him he needs to quit getting so anxious when something happens.  PCP Rosita Fire, MD   Past Medical History:  Diagnosis Date  . COPD (chronic obstructive pulmonary disease) (HCC)    Emphysema radiographically    Patient Active Problem List   Diagnosis Date Noted  . HTN (hypertension) 12/10/2016  . AKI (acute kidney injury) ([place]) 12/10/2016  . Paroxysmal atrial flutter ([place]) 12/10/2016  . Anemia 12/10/2016  . Acute bacterial bronchitis 12/10/2016  . Pneumonia 11/10/2014  . Hypoxia   . Acute on chronic respiratory failure with hypoxia ([place])   . COPD exacerbation ([place]) 10/05/2013  . PNA (pneumonia) 10/05/2013  . Acute respiratory failure with hypoxia ([place]) 10/05/2013  . Sinus tachycardia 10/05/2013  . SOB (shortness of breath) 10/05/2013  . CAP (community acquired pneumonia) 10/05/2013  . Hyperglycemia, drug-induced 10/05/2013  . Pulmonary emphysema ([place])     Past Surgical History:  Procedure Laterality Date  . ARTERY REPAIR     sinus       Home Medications    Prior to Admission medications   Medication Sig Start Date End Date Taking? Authorizing Provider  acetaminophen (TYLENOL) 500 MG tablet  Take 500 mg by mouth every 6 (six) hours as needed. pain    [provider]  albuterol (PROVENTIL HFA;VENTOLIN HFA) 108 (90 Base) MCG/ACT inhaler Inhale 2 puffs into the lungs every 6 (six) hours as needed for wheezing or shortness of breath. 12/12/16   Florencia Reasons, MD  albuterol (PROVENTIL) (2.5 MG/3ML) 0.083% nebulizer solution Take 3 mLs (2.5 mg total) by nebulization every 6 (six) hours as needed for wheezing or shortness of breath. Please provide patient with a nebulizer machine 12/10/16   [name], MD  albuterol (PROVENTIL) (2.5 MG/3ML) 0.083% nebulizer solution Take 3 mLs (2.5 mg total) by nebulization every 6 (six) hours as needed for wheezing or shortness of breath. 12/14/16   Sinda Du, MD  aspirin EC 81 MG EC tablet Take 1 tablet (81 mg total) by mouth daily. 12/12/16   Florencia Reasons, MD  carvedilol (COREG) 3.125 MG tablet Take 1 tablet (3.125 mg total) by mouth 2 (two) times daily with a meal. 12/14/16   Sinda Du, MD  fluticasone [facility]) 50 MCG/ACT nasal spray Place 2 sprays into both nostrils daily. 12/12/16   Florencia Reasons, MD  guaiFENesin (MUCINEX) 600 MG 12 hr tablet Take 1 tablet (600 mg total) by mouth 2 (two) times daily. 12/12/16   Florencia Reasons, MD  omeprazole (PRILOSEC) 20 MG capsule Take 1 po BID x 2 weeks then once a day 01/06/16  Rolland Porter, MD  oseltamivir (TAMIFLU) 30 MG capsule Take 1 capsule (30 mg total) by mouth 2 (two) times daily. 12/14/16   Sinda Du, MD  predniSONE (STERAPRED UNI-PAK 21 TAB) 10 MG (21) TBPK tablet Take by package instructions 12/14/16   Sinda Du, MD  umeclidinium bromide (INCRUSE ELLIPTA) 62.5 MCG/INH AEPB Inhale 1 puff into the lungs daily. 12/14/16   Sinda Du, MD    Family History Family History  Problem Relation Age of Onset  . Heart failure Mother   . Stroke Father     Social History Social History   Tobacco Use  . Smoking status: Former Smoker    Packs/day: 3.00    Years: 40.00    Pack years: 120.00    Types:  Cigarettes  . Smokeless tobacco: Never Used  Substance Use Topics  . Alcohol use: No  . Drug use: No  lives at home Lives alone   Allergies   Penicillins   Review of Systems Review of Systems  All other systems reviewed and are negative.    Physical Exam Updated Vital Signs BP 125/69   Pulse (!) 57   Temp 98.7 F (37.1 C) (Oral)   Resp 16   Ht 5\' 9"  (1.753 m)   Wt 104.3 kg (230 lb)   SpO2 97%   BMI 33.97 kg/m   Physical Exam  Constitutional: He is oriented to person, place, and time. He appears well-developed and well-nourished.  Non-toxic appearance. He does not appear ill. No distress.  HENT:  Head: Normocephalic and atraumatic.  Right Ear: External ear normal.  Left Ear: External ear normal.  Nose: Nose normal. No mucosal edema or rhinorrhea.  Mouth/Throat: Oropharynx is clear and moist and mucous membranes are normal. No dental abscesses or uvula swelling.  Eyes: Conjunctivae and EOM are normal. Pupils are equal, round, and reactive to light.  Neck: Normal range of motion and full passive range of motion without pain. Neck supple.  Cardiovascular: Normal rate, regular rhythm and normal heart sounds. Exam reveals no gallop and no friction rub.  No murmur heard. Pulmonary/Chest: Effort normal. No respiratory distress. He has decreased breath sounds. He has no wheezes. He has no rhonchi. He has no rales. He exhibits no tenderness and no crepitus.  Abdominal: Soft. Normal appearance and bowel sounds are normal. He exhibits no distension. There is no tenderness. There is no rebound and no guarding.  Musculoskeletal: Normal range of motion. He exhibits no edema or tenderness.  Moves all extremities well.   Neurological: He is alert and oriented to person, place, and time. He has normal strength. No cranial nerve deficit.  Skin: Skin is warm, dry and intact. No rash noted. No erythema. No pallor.  Psychiatric: He has a normal mood and affect. His speech is normal and  behavior is normal. His mood appears not anxious.  Nursing note and vitals reviewed.    ED Treatments / Results  Labs (all labs ordered are listed, but only abnormal results are displayed) Labs Reviewed - No data to display  EKG  EKG Interpretation  Date/Time:  Wednesday November 24 2017 01:36:37 EST Ventricular Rate:  86 PR Interval:    QRS Duration: 117 QT Interval:  386 QTC Calculation: 462 R Axis:   75 Text Interpretation:  Sinus rhythm Short PR interval Nonspecific intraventricular conduction delay Low voltage, extremity leads Baseline wander in lead(s) V5 Electrode noise No significant change since last tracing 10 Dec 2016 Confirmed by Rolland Porter [phone]) on 11/24/2017 2:22:39  AM       Radiology Dg Chest 2 View  Result Date: 11/24/2017 CLINICAL DATA:  Shortness of breath beginning tonight. History of COPD. Former smoker. EXAM: CHEST  2 VIEW COMPARISON:  12/10/2016 FINDINGS: Normal heart size and pulmonary vascularity. Emphysematous changes in the lungs. Central interstitial pattern suggesting chronic bronchitis. No airspace disease or consolidation. No blunting of costophrenic angles. No pneumothorax. Degenerative changes in the spine. IMPRESSION: Emphysematous and chronic bronchitic changes in the lungs. No evidence of active pulmonary disease. Electronically Signed   By: Lucienne Capers M.D.   On: 11/24/2017 02:05    Procedures Procedures (including critical care time)  Medications Ordered in ED Medications  albuterol (PROVENTIL) (2.5 MG/3ML) 0.083% nebulizer solution 5 mg (5 mg Nebulization Given 11/24/17 0157)  albuterol (PROVENTIL) (2.5 MG/3ML) 0.083% nebulizer solution 5 mg (5 mg Nebulization Given 11/24/17 0342)     Initial Impression / Assessment and Plan / ED Course  I have reviewed the triage vital signs and the nursing notes.  Pertinent labs & imaging results that were available during my care of the patient were reviewed by me and considered in my medical  decision making (see chart for details).     Patient was given a albuterol nebulizer treatment.  Since it just started less than 2 hours ago he was not started on steroids.  Recheck at 3:30 AM patient's lungs are clear.  He has good air movement.  However patient states he feels like he still wheezing.  And then he forcefully breathes out hard.  He was still complaining of his abdomen feeling tight.  He was given Gaviscon.  Recheck 4:40 AM patient appears to be asleep.  On exam his lungs are clear.  He still complains about his abdomen feeling tight, he states he heard rumbling and felt it go down both sides of his abdomen.  He denies having abdominal pain.  X-rays were obtained of his abdomen.  I reviewed patient's x-rays and to me he has a lot of stool especially in his right colon.  He will be advised when he can do for constipation.  Patient is in no respiratory distress and his lungs are clear unless he does a very forceful exhalation which is exaggerated.  He is being discharged home.  Final Clinical Impressions(s) / ED Diagnoses   Final diagnoses:  COPD exacerbation ([place])  Abdominal distension  Constipation, unspecified constipation type    ED Discharge Orders    None    otc miralax  Plan discharge  Rolland Porter, MD, Barbette Or, MD 11/24/17 [phone]

## 2017-11-24 NOTE — ED Triage Notes (Signed)
PT brought in by [facility] today for c/o SOB at rest. PT states was seen in ED last night for same complaint but SOB worsened today. PT was given 2 albuterol neb tx in route to ED.

## 2017-11-24 NOTE — Discharge Instructions (Signed)
Continue using your inhaler as needed for wheezing or shortness of breath. Get miralax and put one dose or 17 g in 8 ounces of water,  take 1 dose every 30 minutes for 2-3 hours or until you  get good results and then once or twice daily to prevent constipation. Recheck as needed.

## 2017-11-24 NOTE — ED Triage Notes (Signed)
Pt c/o sob that started tonight; pt denies any chest pain

## 2017-11-24 NOTE — ED Provider Notes (Signed)
[facility] EMERGENCY DEPARTMENT Provider Note   CSN: [id number] Arrival date & time: 11/24/17  1447     History   Chief Complaint Chief Complaint  Patient presents with  . Shortness of Breath    HPI Darren Burke is a 71 y.o. male.  HPI  Pt was seen at 1510.  Per pt, c/o gradual onset and worsening of persistent cough and SOB since yesterday. States he "closes my eyes to go to sleep and I think something bad is going to happen." Pt states he "feels anxious." Pt was seen in the ED last night for similar symptoms, improved after neb treatment, and was d/c. Pt does not use O2 at home.  Denies CP/palpitations, no back pain, no abd pain, no N/V/D, no fevers, no rash.    Past Medical History:  Diagnosis Date  . COPD (chronic obstructive pulmonary disease) (HCC)    Emphysema radiographically  . GERD (gastroesophageal reflux disease)   . Hypertension   . Paroxysmal atrial flutter [facility])     Patient Active Problem List   Diagnosis Date Noted  . HTN (hypertension) 12/10/2016  . AKI (acute kidney injury) ([place]) 12/10/2016  . Paroxysmal atrial flutter ([place]) 12/10/2016  . Anemia 12/10/2016  . Acute bacterial bronchitis 12/10/2016  . Pneumonia 11/10/2014  . Hypoxia   . Acute on chronic respiratory failure with hypoxia ([place])   . COPD exacerbation ([place]) 10/05/2013  . PNA (pneumonia) 10/05/2013  . Acute respiratory failure with hypoxia ([place]) 10/05/2013  . Sinus tachycardia 10/05/2013  . SOB (shortness of breath) 10/05/2013  . CAP (community acquired pneumonia) 10/05/2013  . Hyperglycemia, drug-induced 10/05/2013  . Pulmonary emphysema ([place])     Past Surgical History:  Procedure Laterality Date  . ARTERY REPAIR     sinus       Home Medications    Prior to Admission medications   Medication Sig Start Date End Date Taking? Authorizing Provider  acetaminophen (TYLENOL) 500 MG tablet Take 500 mg by mouth every 6 (six) hours as needed. pain    [provider]    albuterol (PROVENTIL HFA;VENTOLIN HFA) 108 (90 Base) MCG/ACT inhaler Inhale 2 puffs into the lungs every 6 (six) hours as needed for wheezing or shortness of breath. 12/12/16   Florencia Reasons, MD  albuterol (PROVENTIL) (2.5 MG/3ML) 0.083% nebulizer solution Take 3 mLs (2.5 mg total) by nebulization every 6 (six) hours as needed for wheezing or shortness of breath. Please provide patient with a nebulizer machine 12/10/16   [name], MD  albuterol (PROVENTIL) (2.5 MG/3ML) 0.083% nebulizer solution Take 3 mLs (2.5 mg total) by nebulization every 6 (six) hours as needed for wheezing or shortness of breath. 12/14/16   Sinda Du, MD  aspirin EC 81 MG EC tablet Take 1 tablet (81 mg total) by mouth daily. 12/12/16   Florencia Reasons, MD  carvedilol (COREG) 3.125 MG tablet Take 1 tablet (3.125 mg total) by mouth 2 (two) times daily with a meal. 12/14/16   Sinda Du, MD  fluticasone [facility]) 50 MCG/ACT nasal spray Place 2 sprays into both nostrils daily. 12/12/16   Florencia Reasons, MD  guaiFENesin (MUCINEX) 600 MG 12 hr tablet Take 1 tablet (600 mg total) by mouth 2 (two) times daily. 12/12/16   Florencia Reasons, MD  omeprazole (PRILOSEC) 20 MG capsule Take 1 po BID x 2 weeks then once a day 01/06/16   Rolland Porter, MD  oseltamivir (TAMIFLU) 30 MG capsule Take 1 capsule (30 mg total) by mouth 2 (two)  times daily. 12/14/16   Sinda Du, MD  predniSONE (STERAPRED UNI-PAK 21 TAB) 10 MG (21) TBPK tablet Take by package instructions 12/14/16   Sinda Du, MD  umeclidinium bromide (INCRUSE ELLIPTA) 62.5 MCG/INH AEPB Inhale 1 puff into the lungs daily. 12/14/16   Sinda Du, MD    Family History Family History  Problem Relation Age of Onset  . Heart failure Mother   . Stroke Father     Social History Social History   Tobacco Use  . Smoking status: Former Smoker    Packs/day: 3.00    Years: 40.00    Pack years: 120.00    Types: Cigarettes  . Smokeless tobacco: Never Used  Substance Use Topics  . Alcohol use: No   . Drug use: No     Allergies   Penicillins   Review of Systems Review of Systems ROS: Statement: All systems negative except as marked or noted in the HPI; Constitutional: Negative for fever and chills. ; ; Eyes: Negative for eye pain, redness and discharge. ; ; ENMT: Negative for ear pain, hoarseness, nasal congestion, sinus pressure and sore throat. ; ; Cardiovascular: Negative for chest pain, palpitations, diaphoresis, and peripheral edema. ; ; Respiratory: +cough, wheezing, SOB. Negative for stridor. ; ; Gastrointestinal: Negative for nausea, vomiting, diarrhea, abdominal pain, blood in stool, hematemesis, jaundice and rectal bleeding. . ; ; Genitourinary: Negative for dysuria, flank pain and hematuria. ; ; Musculoskeletal: Negative for back pain and neck pain. Negative for swelling and trauma.; ; Skin: Negative for pruritus, rash, abrasions, blisters, bruising and skin lesion.; ; Neuro: Negative for headache, lightheadedness and neck stiffness. Negative for weakness, altered level of consciousness, altered mental status, extremity weakness, paresthesias, involuntary movement, seizure and syncope.; Psych:  +anxious. No SI, no SA, no HI, no hallucinations.       Physical Exam Updated Vital Signs BP (!) 162/93 (BP Location: Left Arm)   Pulse (!) 116   Temp 98.8 F (37.1 C) (Oral)   Resp (!) 22   Ht 5\' 11"  (1.803 m)   Wt 104.3 kg (230 lb)   SpO2 94%   BMI 32.08 kg/m    Patient Vitals for the past 24 hrs:  BP Temp Temp src Pulse Resp SpO2 Height Weight  11/24/17 1554 - - - - - (!) 88 % - -  11/24/17 1553 - - - - - (!) 87 % - -  11/24/17 1500 (!) 154/78 - - (!) 113 (!) 22 96 % - -  11/24/17 1459 - - - - - - 5\' 11"  (1.803 m) 104.3 kg (230 lb)  11/24/17 1458 (!) 162/93 98.8 F (37.1 C) Oral (!) 116 (!) 22 94 % - -      Physical Exam 1515: Physical examination:  Nursing notes reviewed; Vital signs and O2 SAT reviewed;  Constitutional: Well developed, Well nourished, Well  hydrated, Uncomfortable appearing.;; Head:  Normocephalic, atraumatic; Eyes: EOMI, PERRL, No scleral icterus; ENMT: Mouth and pharynx normal, Mucous membranes moist; Neck: Supple, Full range of motion, No lymphadenopathy; Cardiovascular: Tachycardic rate and rhythm, No gallop; Respiratory: Breath sounds diminished & equal bilaterally, insp/exp wheezes bilat. No audible wheezing.  Speaking short sentences, sitting upright, tachypneic.; Chest: Nontender, Movement normal; Abdomen: Soft, Nontender, Nondistended, Normal bowel sounds; Genitourinary: No CVA tenderness; Extremities: Pulses normal, No tenderness, No edema, No calf edema or asymmetry.; Neuro: AA&Ox3, Major CN grossly intact.  Speech clear. No gross focal motor or sensory deficits in extremities.; Skin: Color normal, Warm, Dry.; Psych:  Anxious.  ED Treatments / Results  Labs (all labs ordered are listed, but only abnormal results are displayed)   EKG  EKG Interpretation  Date/Time:  Wednesday November 24 2017 14:57:01 EST Ventricular Rate:  121 PR Interval:    QRS Duration: 109 QT Interval:  328 QTC Calculation: 466 R Axis:   -128 Text Interpretation:  Poor data quality Sinus tachycardia Low voltage with right axis deviation Abnormal R-wave progression, late transition Baseline wander When compared with ECG of 11/24/2017 Rate faster Confirmed by Francine Graven [phone]) on 11/24/2017 3:19:44 PM       Radiology   Procedures Procedures (including critical care time)  Medications Ordered in ED Medications  methylPREDNISolone sodium succinate (SOLU-MEDROL) 125 mg/2 mL injection 125 mg (not administered)  albuterol (PROVENTIL,VENTOLIN) solution continuous neb (not administered)  ipratropium (ATROVENT) nebulizer solution 1 mg (not administered)     Initial Impression / Assessment and Plan / ED Course  I have reviewed the triage vital signs and the nursing notes.  Pertinent labs & imaging results that were available during my  care of the patient were reviewed by me and considered in my medical decision making (see chart for details).  MDM Reviewed: previous chart, nursing note and vitals Reviewed previous: labs, ECG and x-ray Interpretation: labs, ECG and x-ray Total time providing critical care: 30-74 minutes. This excludes time spent performing separately reportable procedures and services. Consults: admitting MD   CRITICAL CARE Performed by: Alfonzo Feller Total critical care time: 35 minutes Critical care time was exclusive of separately billable procedures and treating other patients. Critical care was necessary to treat or prevent imminent or life-threatening deterioration. Critical care was time spent personally by me on the following activities: development of treatment plan with patient and/or surrogate as well as nursing, discussions with consultants, evaluation of patient's response to treatment, examination of patient, obtaining history from patient or surrogate, ordering and performing treatments and interventions, ordering and review of laboratory studies, ordering and review of radiographic studies, pulse oximetry and re-evaluation of patient's condition.   Results for orders placed or performed during the hospital encounter of 58/85/02  Basic metabolic panel  Result Value Ref Range   Sodium 141 135 - 145 mmol/L   Potassium 3.9 3.5 - 5.1 mmol/L   Chloride 104 101 - 111 mmol/L   CO2 23 22 - 32 mmol/L   Glucose, Bld 123 (H) 65 - 99 mg/dL   BUN 28 (H) 6 - 20 mg/dL   Creatinine, Ser 2.14 (H) 0.61 - 1.24 mg/dL   Calcium 9.3 8.9 - 10.3 mg/dL   GFR calc non Af Amer 30 (L) >60 mL/min   GFR calc Af Amer 35 (L) >60 mL/min   Anion gap 14 5 - 15  Troponin I  Result Value Ref Range   Troponin I <0.03 <0.03 ng/mL  Brain natriuretic peptide  Result Value Ref Range   B Natriuretic Peptide 62.0 0.0 - 100.0 pg/mL  CBC with Differential  Result Value Ref Range   WBC 16.0 (H) 4.0 - 10.5 K/uL   RBC  4.01 (L) 4.22 - 5.81 MIL/uL   Hemoglobin 12.2 (L) 13.0 - 17.0 g/dL   HCT 38.4 (L) 39.0 - 52.0 %   MCV 95.8 78.0 - 100.0 fL   MCH 30.4 26.0 - 34.0 pg   MCHC 31.8 30.0 - 36.0 g/dL   RDW 12.4 11.5 - 15.5 %   Platelets 262 150 - 400 K/uL   Neutrophils Relative % 86 %   Neutro Abs 13.8 (  H) 1.7 - 7.7 K/uL   Lymphocytes Relative 8 %   Lymphs Abs 1.2 0.7 - 4.0 K/uL   Monocytes Relative 6 %   Monocytes Absolute 1.0 0.1 - 1.0 K/uL   Eosinophils Relative 0 %   Eosinophils Absolute 0.0 0.0 - 0.7 K/uL   Basophils Relative 0 %   Basophils Absolute 0.0 0.0 - 0.1 K/uL  Urinalysis, Routine w reflex microscopic  Result Value Ref Range   Color, Urine YELLOW YELLOW   APPearance HAZY (A) CLEAR   Specific Gravity, Urine 1.014 1.005 - 1.030   pH 5.0 5.0 - 8.0   Glucose, UA NEGATIVE NEGATIVE mg/dL   Hgb urine dipstick LARGE (A) NEGATIVE   Bilirubin Urine NEGATIVE NEGATIVE   Ketones, ur NEGATIVE NEGATIVE mg/dL   Protein, ur 100 (A) NEGATIVE mg/dL   Nitrite NEGATIVE NEGATIVE   Leukocytes, UA MODERATE (A) NEGATIVE   RBC / HPF 6-30 0 - 5 RBC/hpf   WBC, UA TOO NUMEROUS TO COUNT 0 - 5 WBC/hpf   Bacteria, UA RARE (A) NONE SEEN   Squamous Epithelial / LPF 0-5 (A) NONE SEEN   Hyaline Casts, UA PRESENT    Dg Chest Port 1 View Result Date: 11/24/2017 CLINICAL DATA:  Shortness of breath beginning this morning. EXAM: PORTABLE CHEST 1 VIEW COMPARISON:  PA and lateral chest 11/24/2017 and 12/10/2016. FINDINGS: The lungs are emphysematous but clear. No pneumothorax or pleural effusion. No acute bony abnormality. IMPRESSION: Emphysema without acute disease. Electronically Signed   By: Inge Rise M.D.   On: 11/24/2017 15:42    Results for TYRESSE, JAYSON (MRN [id number]) as of 11/24/2017 17:39  Ref. Range 12/11/2016 04:47 12/12/2016 06:27 12/13/2016 06:52 11/24/2017 15:39  BUN Latest Ref Range: 6 - 20 mg/dL 34 (H) 46 (H) 59 (H) 28 (H)  Creatinine Latest Ref Range: 0.61 - 1.24 mg/dL 2.83 (H) 2.72 (H) 2.57 (H) 2.14  (H)    1810:   On arrival: pt sitting upright, tachypneic, tachycardic, Sats 87-88 % R/A, lungs diminished. IV solumedrol and hour long neb started. After neb: pt appears more comfortable at rest, less tachypneic, Sats 88 % on R/A, lungs continue diminished. Dx and testing d/w pt.  Questions answered.  Verb understanding, agreeable to admit.  T/C to [facility]. Maudie Mercury, case discussed, including:  HPI, pertinent PM/SHx, VS/PE, dx testing, ED course and treatment:  Agreeable to admit.      Final Clinical Impressions(s) / ED Diagnoses   Final diagnoses:  None    ED Discharge Orders    None       Francine Graven, DO 11/27/17 1054

## 2017-11-24 NOTE — H&P (Addendum)
History and Physical    Darren Burke EGB:[id number] DOB: [birth year] DOA: 11/24/2017  PCP: Rosita Fire, MD   Patient coming from: Home  Chief Complaint: Dyspnea  HPI: Darren Burke is a 72 y.o. male with medical history significant for COPD, hypertension, GERD, and paroxysmal atrial flutter presented to the emergency department with worsening shortness of breath as well as persistent cough with whitish sputum production.  He actually presented to the emergency department yesterday and received a breathing treatment for similar symptoms and was sent home after feeling much better.  He states that his symptoms began once again this morning and notes that he feels anxious when he closes his eyes.  Patient denies any chest pain, palpitations, fever, chills, nausea, vomiting, diarrhea, or rash.  He has used his home inhaler with no relief.  He denies any specific aggravating or alleviating factors and currently has no pain.   ED Course: He has received a breathing treatment along with some Ativan and 125 mg IV Solu-Medrol with significant relief noted thus far.  He currently appears to be in the high 80th percentile as far as O2 saturation with nasal cannula off.  He does not wear oxygen at home.  Chest x-ray with no findings of infiltrate.  Urinalysis demonstrates significant leukocytosis.  Review of Systems: As per HPI otherwise 10 point review of systems negative.   Past Medical History:  Diagnosis Date  . COPD (chronic obstructive pulmonary disease) (HCC)    Emphysema radiographically  . GERD (gastroesophageal reflux disease)   . Hypertension   . Paroxysmal atrial flutter [facility])     Past Surgical History:  Procedure Laterality Date  . ARTERY REPAIR     sinus     reports that he has quit smoking. His smoking use included cigarettes. He has a 120.00 pack-year smoking history. he has never used smokeless tobacco. He reports that he does not drink alcohol or use drugs.  Allergies    Allergen Reactions  . Penicillins Hives and Itching    Has patient had a PCN reaction causing immediate rash, facial/tongue/throat swelling, SOB or lightheadedness with hypotension: no Has patient had a PCN reaction causing severe rash involving mucus membranes or skin necrosis: no Has patient had a PCN reaction that required hospitalization: no Has patient had a PCN reaction occurring within the last 10 years: no If all of the above answers are "NO", then may proceed with Cephalosporin use.    Family History  Problem Relation Age of Onset  . Heart failure Mother   . Stroke Father     Prior to Admission medications   Medication Sig Start Date End Date Taking? Authorizing Provider  carvedilol (COREG) 3.125 MG tablet Take 1 tablet (3.125 mg total) by mouth 2 (two) times daily with a meal. 12/14/16  Yes Sinda Du, MD  umeclidinium bromide (INCRUSE ELLIPTA) 62.5 MCG/INH AEPB Inhale 1 puff into the lungs daily. 12/14/16  Yes Sinda Du, MD  acetaminophen (TYLENOL) 500 MG tablet Take 500 mg by mouth every 6 (six) hours as needed. pain    [provider]  albuterol (PROVENTIL HFA;VENTOLIN HFA) 108 (90 Base) MCG/ACT inhaler Inhale 2 puffs into the lungs every 6 (six) hours as needed for wheezing or shortness of breath. 12/12/16   Florencia Reasons, MD  albuterol (PROVENTIL) (2.5 MG/3ML) 0.083% nebulizer solution Take 3 mLs (2.5 mg total) by nebulization every 6 (six) hours as needed for wheezing or shortness of breath. Please provide patient with a nebulizer machine 12/10/16  [name], MD  albuterol (PROVENTIL) (2.5 MG/3ML) 0.083% nebulizer solution Take 3 mLs (2.5 mg total) by nebulization every 6 (six) hours as needed for wheezing or shortness of breath. 12/14/16   Sinda Du, MD  aspirin EC 81 MG EC tablet Take 1 tablet (81 mg total) by mouth daily. 12/12/16   Florencia Reasons, MD  fluticasone (FLONASE) 50 MCG/ACT nasal spray Place 2 sprays into both nostrils daily. 12/12/16   Florencia Reasons, MD   guaiFENesin (MUCINEX) 600 MG 12 hr tablet Take 1 tablet (600 mg total) by mouth 2 (two) times daily. 12/12/16   Florencia Reasons, MD  omeprazole (PRILOSEC) 20 MG capsule Take 1 po BID x 2 weeks then once a day 01/06/16   Rolland Porter, MD    Physical Exam: Vitals:   11/24/17 1700 11/24/17 1730 11/24/17 1800 11/24/17 1830  BP: (!) 72/49 109/68 121/71 121/61  Pulse: (!) 103 97 (!) 106 98  Resp: (!) 22 20 (!) 22 (!) 22  Temp:      TempSrc:      SpO2: 100% 99% 90% 94%  Weight:      Height:        Constitutional: NAD, calm, comfortable on 2L [place] Vitals:   11/24/17 1700 11/24/17 1730 11/24/17 1800 11/24/17 1830  BP: (!) 72/49 109/68 121/71 121/61  Pulse: (!) 103 97 (!) 106 98  Resp: (!) 22 20 (!) 22 (!) 22  Temp:      TempSrc:      SpO2: 100% 99% 90% 94%  Weight:      Height:       Eyes: lids and conjunctivae normal ENMT: Mucous membranes are moist.  Neck: normal, supple Respiratory: diminished to auscultation bilaterally. Normal respiratory effort. No accessory muscle use.  Cardiovascular: Regular rate and rhythm, no murmurs. No extremity edema. Abdomen: no tenderness, no distention. Bowel sounds positive.  Musculoskeletal:  No joint deformity upper and lower extremities.   Skin: no rashes, lesions, ulcers.  Psychiatric: Normal judgment and insight. Alert and oriented x 3. Normal mood.   Labs on Admission: I have personally reviewed following labs and imaging studies  CBC: Recent Labs  Lab 11/24/17 1539  WBC 16.0*  NEUTROABS 13.8*  HGB 12.2*  HCT 38.4*  MCV 95.8  PLT 527   Basic Metabolic Panel: Recent Labs  Lab 11/24/17 1539  NA 141  K 3.9  CL 104  CO2 23  GLUCOSE 123*  BUN 28*  CREATININE 2.14*  CALCIUM 9.3   GFR: Estimated Creatinine Clearance: 40 mL/min (A) (by C-G formula based on SCr of 2.14 mg/dL (H)). Liver Function Tests: No results for input(s): AST, ALT, ALKPHOS, BILITOT, PROT, ALBUMIN in the last 168 hours. No results for input(s): LIPASE, AMYLASE in  the last 168 hours. No results for input(s): AMMONIA in the last 168 hours. Coagulation Profile: No results for input(s): INR, PROTIME in the last 168 hours. Cardiac Enzymes: Recent Labs  Lab 11/24/17 1539  TROPONINI <0.03   BNP (last 3 results) No results for input(s): PROBNP in the last 8760 hours. HbA1C: No results for input(s): HGBA1C in the last 72 hours. CBG: No results for input(s): GLUCAP in the last 168 hours. Lipid Profile: No results for input(s): CHOL, HDL, LDLCALC, TRIG, CHOLHDL, LDLDIRECT in the last 72 hours. Thyroid Function Tests: No results for input(s): TSH, T4TOTAL, FREET4, T3FREE, THYROIDAB in the last 72 hours. Anemia Panel: No results for input(s): VITAMINB12, FOLATE, FERRITIN, TIBC, IRON, RETICCTPCT in the last 72 hours. Urine analysis:  Component Value Date/Time   COLORURINE YELLOW 11/24/2017 1518   APPEARANCEUR HAZY (A) 11/24/2017 1518   LABSPEC 1.014 11/24/2017 1518   PHURINE 5.0 11/24/2017 1518   GLUCOSEU NEGATIVE 11/24/2017 1518   HGBUR LARGE (A) 11/24/2017 1518   BILIRUBINUR NEGATIVE 11/24/2017 1518   KETONESUR NEGATIVE 11/24/2017 1518   PROTEINUR 100 (A) 11/24/2017 1518   UROBILINOGEN 0.2 11/10/2014 0400   NITRITE NEGATIVE 11/24/2017 1518   LEUKOCYTESUR MODERATE (A) 11/24/2017 1518    Radiological Exams on Admission: Dg Chest 2 View  Result Date: 11/24/2017 CLINICAL DATA:  Shortness of breath beginning tonight. History of COPD. Former smoker. EXAM: CHEST  2 VIEW COMPARISON:  12/10/2016 FINDINGS: Normal heart size and pulmonary vascularity. Emphysematous changes in the lungs. Central interstitial pattern suggesting chronic bronchitis. No airspace disease or consolidation. No blunting of costophrenic angles. No pneumothorax. Degenerative changes in the spine. IMPRESSION: Emphysematous and chronic bronchitic changes in the lungs. No evidence of active pulmonary disease. Electronically Signed   By: Lucienne Capers M.D.   On: 11/24/2017 02:05    Dg Chest Port 1 View  Result Date: 11/24/2017 CLINICAL DATA:  Shortness of breath beginning this morning. EXAM: PORTABLE CHEST 1 VIEW COMPARISON:  PA and lateral chest 11/24/2017 and 12/10/2016. FINDINGS: The lungs are emphysematous but clear. No pneumothorax or pleural effusion. No acute bony abnormality. IMPRESSION: Emphysema without acute disease. Electronically Signed   By: Inge Rise M.D.   On: 11/24/2017 15:42   Dg Abd 2 Views  Result Date: 11/24/2017 CLINICAL DATA:  Increased shortness of breath. COPD. Previous smoker. Abdominal bloating. Weakness. EXAM: ABDOMEN - 2 VIEW COMPARISON:  Abdominal series 02/06/2014 FINDINGS: Scattered gas and stool in the colon. No small or large bowel distention. No free intra-abdominal air. No abnormal air-fluid levels. No radiopaque stones. Visualized bones appear intact. IMPRESSION: Nonobstructive bowel gas pattern. Electronically Signed   By: Lucienne Capers M.D.   On: 11/24/2017 05:32    EKG: Independently reviewed. Sinus tach with R axis deviation.  Assessment/Plan Principal Problem:   COPD exacerbation (HCC) Active Problems:   HTN (hypertension)   Anemia   Acute bacterial bronchitis   CKD (chronic kidney disease), stage III ([place])    1. Acute COPD exacerbation.  Will continue duo nebs every 6 hours along with oral prednisone.  Respiratory panel.  Mucinex for cough. 2. Acute bacterial bronchitis versus early pneumonia-community-acquired.  Will initiate coverage with IV Rocephin and azithromycin.  Await respiratory panel as well as urine strep pneumonia and Legionella testing.  Sputum cultures if these can be obtained. 3. Possible UTI-asymptomatic.  Will cover with above antibiotics and obtain urine culture. 4. CKD-likely stage III.  Continue to monitor and renally dose medications. 5. Normocytic anemia likely related to CKD.  Stable; continue to monitor. 6. Hypertension.  Currently well controlled.  Continue home medications and monitor.  Heart healthy diet.   DVT prophylaxis: Lovenox Code Status: Full Family Communication: None Disposition Plan:Home likely in AM Consults called:None Admission status: Obs, tele   Pratik Darleen Crocker DO [facility] Pager [phone]  If 7PM-7AM, please contact night-coverage [email or web address] Password [facility]  11/24/2017, 7:26 PM

## 2017-11-25 DIAGNOSIS — I10 Essential (primary) hypertension: Secondary | ICD-10-CM

## 2017-11-25 DIAGNOSIS — J441 Chronic obstructive pulmonary disease with (acute) exacerbation: Principal | ICD-10-CM

## 2017-11-25 DIAGNOSIS — J9601 Acute respiratory failure with hypoxia: Secondary | ICD-10-CM

## 2017-11-25 DIAGNOSIS — N184 Chronic kidney disease, stage 4 (severe): Secondary | ICD-10-CM

## 2017-11-25 LAB — CBC
HCT: 33.2 % — ABNORMAL LOW (ref 39.0–52.0)
HEMOGLOBIN: 10.4 g/dL — AB (ref 13.0–17.0)
MCH: 30.1 pg (ref 26.0–34.0)
MCHC: 31.3 g/dL (ref 30.0–36.0)
MCV: 96.2 fL (ref 78.0–100.0)
Platelets: 223 10*3/uL (ref 150–400)
RBC: 3.45 MIL/uL — AB (ref 4.22–5.81)
RDW: 12.5 % (ref 11.5–15.5)
WBC: 15.5 10*3/uL — AB (ref 4.0–10.5)

## 2017-11-25 LAB — BASIC METABOLIC PANEL
ANION GAP: 11 (ref 5–15)
BUN: 38 mg/dL — ABNORMAL HIGH (ref 6–20)
CHLORIDE: 105 mmol/L (ref 101–111)
CO2: 22 mmol/L (ref 22–32)
Calcium: 8.6 mg/dL — ABNORMAL LOW (ref 8.9–10.3)
Creatinine, Ser: 2.6 mg/dL — ABNORMAL HIGH (ref 0.61–1.24)
GFR calc non Af Amer: 24 mL/min — ABNORMAL LOW (ref >60)
GFR, EST AFRICAN AMERICAN: 27 mL/min — AB (ref >60)
Glucose, Bld: 139 mg/dL — ABNORMAL HIGH (ref 65–99)
Potassium: 4.2 mmol/L (ref 3.5–5.1)
SODIUM: 138 mmol/L (ref 135–145)

## 2017-11-25 LAB — RESPIRATORY PANEL BY PCR
ADENOVIRUS-RVPPCR: NOT DETECTED
Bordetella pertussis: NOT DETECTED
CHLAMYDOPHILA PNEUMONIAE-RVPPCR: NOT DETECTED
CORONAVIRUS NL63-RVPPCR: NOT DETECTED
Coronavirus 229E: NOT DETECTED
Coronavirus HKU1: NOT DETECTED
Coronavirus OC43: NOT DETECTED
Influenza A: NOT DETECTED
Influenza B: NOT DETECTED
METAPNEUMOVIRUS-RVPPCR: NOT DETECTED
Mycoplasma pneumoniae: NOT DETECTED
PARAINFLUENZA VIRUS 1-RVPPCR: NOT DETECTED
Parainfluenza Virus 2: NOT DETECTED
Parainfluenza Virus 3: NOT DETECTED
Parainfluenza Virus 4: NOT DETECTED
RESPIRATORY SYNCYTIAL VIRUS-RVPPCR: NOT DETECTED
RHINOVIRUS / ENTEROVIRUS - RVPPCR: NOT DETECTED

## 2017-11-25 LAB — HEMOGLOBIN A1C
Hgb A1c MFr Bld: 5.8 % — ABNORMAL HIGH (ref 4.8–5.6)
Mean Plasma Glucose: 119.76 mg/dL

## 2017-11-25 LAB — PROCALCITONIN: PROCALCITONIN: 0.45 ng/mL

## 2017-11-25 LAB — STREP PNEUMONIAE URINARY ANTIGEN: Strep Pneumo Urinary Antigen: NEGATIVE

## 2017-11-25 MED ORDER — METHYLPREDNISOLONE SODIUM SUCC 125 MG IJ SOLR
60.0000 mg | Freq: Four times a day (QID) | INTRAMUSCULAR | Status: DC
  Administered 2017-11-25 – 2017-11-27 (×9): 60 mg via INTRAVENOUS
  Filled 2017-11-25 (×9): qty 2

## 2017-11-25 MED ORDER — POTASSIUM CHLORIDE IN NACL 20-0.9 MEQ/L-% IV SOLN
INTRAVENOUS | Status: AC
  Administered 2017-11-25 – 2017-11-26 (×2): via INTRAVENOUS

## 2017-11-25 MED ORDER — BUDESONIDE 0.5 MG/2ML IN SUSP
0.5000 mg | Freq: Two times a day (BID) | RESPIRATORY_TRACT | Status: DC
  Administered 2017-11-25 – 2017-11-27 (×4): 0.5 mg via RESPIRATORY_TRACT
  Filled 2017-11-25 (×4): qty 2

## 2017-11-25 NOTE — Care Management Note (Signed)
Case Management Note  Patient Details  Name: [name] MRN: [id number] Date of Birth: [birth year]  Subjective/Objective:            Admitted with COPD. Pt is from home, ind at baseline. Pt drives, has PCP, and insurance with drug coverage. He is on oxygen acutely and does not have neb machine pta.         Action/Plan: DC home with self care. May need neb machine if neb tx's ordered at DC. Anticipate pt will be able to wean from O2 prior to DC.  Expected Discharge Date:  11/25/17               Expected Discharge Plan:  Home/Self Care  In-House Referral:  NA  Discharge planning Services  CM Consult  Post Acute Care Choice:  NA Choice offered to:  NA  DME Arranged:    DME Agency:     HH Arranged:    HH Agency:     Status of Service:  Completed, signed off  If discussed at [employer] of Stay Meetings, dates discussed:    Additional Comments:  Sherald Barge, RN 11/25/2017, 11:24 AM

## 2017-11-25 NOTE — Progress Notes (Signed)
Nutrition Brief Note  Nutritional Screen performed as part of the COPD Gold Protocol  Wt Readings from Last 15 Encounters:  11/24/17 228 lb 2.8 oz (103.5 kg)  11/24/17 230 lb (104.3 kg)  12/14/16 244 lb 4.8 oz (110.8 kg)  01/05/16 245 lb (111.1 kg)  11/10/14 260 lb (117.9 kg)  11/09/14 240 lb (108.9 kg)  02/06/14 245 lb (111.1 kg)  10/05/13 230 lb (104.3 kg)  05/24/12 228 lb (103.4 kg)  07/14/11 235 lb (106.6 kg)   Body mass index is 31.82 kg/m. Patient meets criteria for Obese based on current BMI.   Pt says that PTA, he did not have any changes in his appetite. He denies having any barriers to his normal intake at home. No n/v/c/d. He denies his COPD affecting his ability to eat.   He reports a UBW of 230-235. He is just under this now. Per chart, his weight varies, but does appear to be overall stable between 230-240.    Current diet order is heart Healthy, patient is consuming approximately 50% of meals at this time. He says he would eat better "if I had some salt". Will discuss w/ MD. Labs and medications reviewed.   No nutrition interventions warranted at this time. If nutrition issues arise, please consult RD.   Burtis Junes RD, LDN, CNSC Clinical Nutrition Pager: [phone] 11/25/2017 2:40 PM

## 2017-11-25 NOTE — Progress Notes (Signed)
PROGRESS NOTE  Darren Burke RCV:[id number] DOB: 05/04/1946 DOA: 11/24/2017 PCP: Rosita Fire, MD  Brief History:  72 year old male with a history of COPD, hypertension, atrial flutter, GERD, CKD stage III presenting with 4-day history of worsening shortness of breath, coughing, and wheezing.  The patient visited the emergency department on 11/24/2017.  The patient improved clinically after bronchodilator treatment.  He was discharged home in stable condition.  Unfortunately, the patient became short of breath once again with coughing and wheezing.  He denies any fevers, chills, chest pain, nausea, vomiting, diarrhea, abdominal pain.  He denies any headache or neck pain or sore throat.  He tried using his home inhaler without improvement.  Upon presentation, the patient was noted to be hypoxic with oxygen saturation 88% on room air with low-grade temperature of 99.0 F.  He was noted to have WBC 16.0, but was hemodynamically stable.  Assessment/Plan: Acute respiratory failure with hypoxia -Secondary to COPD exacerbation -Wean oxygen for saturation greater 92% -Pulmonary hygiene -Presently stable on 2 L nasal cannula -personally reviewed CXR--chronic interstitial markings, no infiltrates -personally reviewed EKG--sinus, lot artifact, nonspecific ST changes  COPD exacerbation -Start Pulmicort -Continue duo nebs -Start IV Solu-Medrol -Check viral respiratory panel  -80-pack-year history check procalcitonin -check PCT  Pyuria -Continue ceftriaxone pending urine culture data  CKD stage 4 -Baseline creatinine 2.5-2.8  -am BMP  Essential hypertension -Continue carvedilol    Disposition Plan:   Home in 1-2 days  Family Communication:  No Family at bedside  Consultants:  none  Code Status:  FULL  DVT Prophylaxis:  [place] Lovenox   Procedures: As Listed in Progress Note Above  Antibiotics: Azithromycin 1/30>>> Ceftriaxone 1/30>>>    Subjective: Patient states  that he is breathing better but still has some dyspnea with exertion.  He continues to have cough with white sputum.  Denies any headache, fevers, chills, chest pain, nausea, vomiting, diarrhea, abdominal pain, dysuria, hematuria.  He denies any hemoptysis.  Objective: Vitals:   11/24/17 2142 11/24/17 2209 11/25/17 0522 11/25/17 0752  BP: 114/63  119/65   Pulse: 96     Resp: (!) 24     Temp: 99 F (37.2 C)  97.8 F (36.6 C)   TempSrc: Oral  Oral   SpO2: 99% 93% 96% 93%  Weight: 103.5 kg (228 lb 2.8 oz)     Height: 5\' 11"  (1.803 m)       Intake/Output Summary (Last 24 hours) at 11/25/2017 1025 Last data filed at 11/24/2017 2103 Gross per 24 hour  Intake 3 ml  Output -  Net 3 ml   Weight change:  Exam:   General:  Pt is alert, follows commands appropriately, not in acute distress  HEENT: No icterus, No thrush, No neck mass, [place]/AT  Cardiovascular: RRR, S1/S2, no rubs, no gallops  Respiratory: Bibasilar rales.  Bilateral expiratory wheeze.  Good air movement.  Abdomen: Soft/+BS, non tender, non distended, no guarding  Extremities: trace LE edema, No lymphangitis, No petechiae, No rashes, no synovitis   Data Reviewed: I have personally reviewed following labs and imaging studies Basic Metabolic Panel: Recent Labs  Lab 11/24/17 1539 11/25/17 0437  NA 141 138  K 3.9 4.2  CL 104 105  CO2 23 22  GLUCOSE 123* 139*  BUN 28* 38*  CREATININE 2.14* 2.60*  CALCIUM 9.3 8.6*   Liver Function Tests: No results for input(s): AST, ALT, ALKPHOS, BILITOT, PROT, ALBUMIN in the last 168 hours.  No results for input(s): LIPASE, AMYLASE in the last 168 hours. No results for input(s): AMMONIA in the last 168 hours. Coagulation Profile: No results for input(s): INR, PROTIME in the last 168 hours. CBC: Recent Labs  Lab 11/24/17 1539 11/25/17 0437  WBC 16.0* 15.5*  NEUTROABS 13.8*  --   HGB 12.2* 10.4*  HCT 38.4* 33.2*  MCV 95.8 96.2  PLT 262 223   Cardiac Enzymes: Recent  Labs  Lab 11/24/17 1539  TROPONINI <0.03   BNP: Invalid input(s): POCBNP CBG: No results for input(s): GLUCAP in the last 168 hours. HbA1C: No results for input(s): HGBA1C in the last 72 hours. Urine analysis:    Component Value Date/Time   COLORURINE YELLOW 11/24/2017 1518   APPEARANCEUR HAZY (A) 11/24/2017 1518   LABSPEC 1.014 11/24/2017 1518   PHURINE 5.0 11/24/2017 1518   GLUCOSEU NEGATIVE 11/24/2017 1518   HGBUR LARGE (A) 11/24/2017 1518   BILIRUBINUR NEGATIVE 11/24/2017 1518   [place] 11/24/2017 1518   PROTEINUR 100 (A) 11/24/2017 1518   UROBILINOGEN 0.2 11/10/2014 0400   NITRITE NEGATIVE 11/24/2017 1518   LEUKOCYTESUR MODERATE (A) 11/24/2017 1518   Sepsis Labs: @LABRCNTIP (procalcitonin:4,lacticidven:4) ) Recent Results (from the past 240 hour(s))  Culture, blood (routine x 2) Call MD if unable to obtain prior to antibiotics being given     Status: None (Preliminary result)   Collection Time: 11/24/17  7:57 PM  Result Value Ref Range Status   Specimen Description BLOOD LEFT ARM  Final   Special Requests   Final    BOTTLES DRAWN AEROBIC AND ANAEROBIC Blood Culture adequate volume   Culture NO GROWTH < 12 HOURS  Final   Report Status PENDING  Incomplete  Culture, blood (routine x 2) Call MD if unable to obtain prior to antibiotics being given     Status: None (Preliminary result)   Collection Time: 11/24/17  8:01 PM  Result Value Ref Range Status   Specimen Description BLOOD LEFT HAND  Final   Special Requests   Final    BOTTLES DRAWN AEROBIC AND ANAEROBIC Blood Culture adequate volume   Culture NO GROWTH < 12 HOURS  Final   Report Status PENDING  Incomplete     Scheduled Meds: . aspirin EC  81 mg Oral Daily  . carvedilol  3.125 mg Oral BID WC  . enoxaparin (LOVENOX) injection  40 mg Subcutaneous Q24H  . fluticasone  2 spray Each Nare Daily  . guaiFENesin  600 mg Oral BID  . guaiFENesin  600 mg Oral BID  . ipratropium-albuterol  3 mL Nebulization  QID  . pantoprazole  40 mg Oral Daily  . predniSONE  40 mg Oral Q breakfast  . sodium chloride flush  3 mL Intravenous Q12H  . umeclidinium bromide  1 puff Inhalation Daily   Continuous Infusions: . sodium chloride    . azithromycin Stopped (11/25/17 0020)  . cefTRIAXone (ROCEPHIN)  IV Stopped (11/24/17 2116)    Procedures/Studies: Dg Chest 2 View  Result Date: 11/24/2017 CLINICAL DATA:  Shortness of breath beginning tonight. History of COPD. Former smoker. EXAM: CHEST  2 VIEW COMPARISON:  12/10/2016 FINDINGS: Normal heart size and pulmonary vascularity. Emphysematous changes in the lungs. Central interstitial pattern suggesting chronic bronchitis. No airspace disease or consolidation. No blunting of costophrenic angles. No pneumothorax. Degenerative changes in the spine. IMPRESSION: Emphysematous and chronic bronchitic changes in the lungs. No evidence of active pulmonary disease. Electronically Signed   By: Lucienne Capers M.D.   On: 11/24/2017 02:05  Dg Chest Port 1 View  Result Date: 11/24/2017 CLINICAL DATA:  Shortness of breath beginning this morning. EXAM: PORTABLE CHEST 1 VIEW COMPARISON:  PA and lateral chest 11/24/2017 and 12/10/2016. FINDINGS: The lungs are emphysematous but clear. No pneumothorax or pleural effusion. No acute bony abnormality. IMPRESSION: Emphysema without acute disease. Electronically Signed   By: Inge Rise M.D.   On: 11/24/2017 15:42   Dg Abd 2 Views  Result Date: 11/24/2017 CLINICAL DATA:  Increased shortness of breath. COPD. Previous smoker. Abdominal bloating. Weakness. EXAM: ABDOMEN - 2 VIEW COMPARISON:  Abdominal series 02/06/2014 FINDINGS: Scattered gas and stool in the colon. No small or large bowel distention. No free intra-abdominal air. No abnormal air-fluid levels. No radiopaque stones. Visualized bones appear intact. IMPRESSION: Nonobstructive bowel gas pattern. Electronically Signed   By: Lucienne Capers M.D.   On: 11/24/2017 05:32     Orson Eva, DO  [facility] Pager [phone]  If 7PM-7AM, please contact night-coverage [email or web address] Password [email or web address] 11/25/2017, 8:32 AM   LOS: 1 day

## 2017-11-26 DIAGNOSIS — R739 Hyperglycemia, unspecified: Secondary | ICD-10-CM

## 2017-11-26 DIAGNOSIS — T50905A Adverse effect of unspecified drugs, medicaments and biological substances, initial encounter: Secondary | ICD-10-CM

## 2017-11-26 LAB — CBC WITH DIFFERENTIAL/PLATELET
BASOS ABS: 0 10*3/uL (ref 0.0–0.1)
BASOS PCT: 0 %
EOS ABS: 0 10*3/uL (ref 0.0–0.7)
Eosinophils Relative: 0 %
HCT: 31.2 % — ABNORMAL LOW (ref 39.0–52.0)
Hemoglobin: 9.9 g/dL — ABNORMAL LOW (ref 13.0–17.0)
Lymphocytes Relative: 5 %
Lymphs Abs: 0.8 10*3/uL (ref 0.7–4.0)
MCH: 30.6 pg (ref 26.0–34.0)
MCHC: 31.7 g/dL (ref 30.0–36.0)
MCV: 96.3 fL (ref 78.0–100.0)
MONO ABS: 0.4 10*3/uL (ref 0.1–1.0)
Monocytes Relative: 2 %
Neutro Abs: 15 10*3/uL — ABNORMAL HIGH (ref 1.7–7.7)
Neutrophils Relative %: 93 %
PLATELETS: 228 10*3/uL (ref 150–400)
RBC: 3.24 MIL/uL — ABNORMAL LOW (ref 4.22–5.81)
RDW: 12.6 % (ref 11.5–15.5)
WBC: 16.1 10*3/uL — ABNORMAL HIGH (ref 4.0–10.5)

## 2017-11-26 LAB — BASIC METABOLIC PANEL
Anion gap: 11 (ref 5–15)
BUN: 53 mg/dL — ABNORMAL HIGH (ref 6–20)
CALCIUM: 8.5 mg/dL — AB (ref 8.9–10.3)
CO2: 20 mmol/L — ABNORMAL LOW (ref 22–32)
CREATININE: 2.75 mg/dL — AB (ref 0.61–1.24)
Chloride: 104 mmol/L (ref 101–111)
GFR, EST AFRICAN AMERICAN: 25 mL/min — AB (ref >60)
GFR, EST NON AFRICAN AMERICAN: 22 mL/min — AB (ref >60)
GLUCOSE: 148 mg/dL — AB (ref 65–99)
Potassium: 4.5 mmol/L (ref 3.5–5.1)
Sodium: 135 mmol/L (ref 135–145)

## 2017-11-26 LAB — EXPECTORATED SPUTUM ASSESSMENT W GRAM STAIN, RFLX TO RESP C

## 2017-11-26 LAB — LEGIONELLA PNEUMOPHILA SEROGP 1 UR AG: L. pneumophila Serogp 1 Ur Ag: NEGATIVE

## 2017-11-26 LAB — URINE CULTURE

## 2017-11-26 LAB — HIV ANTIBODY (ROUTINE TESTING W REFLEX): HIV SCREEN 4TH GENERATION: NONREACTIVE

## 2017-11-26 MED ORDER — AZITHROMYCIN 250 MG PO TABS
500.0000 mg | ORAL_TABLET | Freq: Every day | ORAL | Status: DC
  Administered 2017-11-26: 500 mg via ORAL
  Filled 2017-11-26: qty 2

## 2017-11-26 MED ORDER — SENNA 8.6 MG PO TABS
2.0000 | ORAL_TABLET | Freq: Every day | ORAL | Status: DC
  Administered 2017-11-26 – 2017-11-27 (×2): 17.2 mg via ORAL
  Filled 2017-11-26 (×2): qty 2

## 2017-11-26 MED ORDER — POLYETHYLENE GLYCOL 3350 17 G PO PACK
17.0000 g | PACK | Freq: Every day | ORAL | Status: DC
  Administered 2017-11-26 – 2017-11-27 (×2): 17 g via ORAL
  Filled 2017-11-26 (×2): qty 1

## 2017-11-26 NOTE — Plan of Care (Signed)
Pt rested duration of night; will continue to monitor.

## 2017-11-26 NOTE — Progress Notes (Signed)
PHARMACIST - PHYSICIAN COMMUNICATION DR:   [facility] CONCERNING: Antibiotic IV to Oral Route Change Policy  RECOMMENDATION: This patient is receiving Zithromax by the intravenous route.  Based on criteria approved by the Pharmacy and Therapeutics Committee, the antibiotic(s) is/are being converted to the equivalent oral dose form(s).   DESCRIPTION: These criteria include:  Patient being treated for a respiratory tract infection, urinary tract infection, cellulitis or clostridium difficile associated diarrhea if on metronidazole  The patient is not neutropenic and does not exhibit a GI malabsorption state  The patient is eating (either orally or via tube) and/or has been taking other orally administered medications for a least 24 hours  The patient is improving clinically and has a Tmax < 100.5  If you have questions about this conversion, please contact the Pharmacy Department  [x]   [phone] )  [facility] []   [phone] )  [facility] []   [phone] )  [facility] []   [phone] )  [facility] []   [phone] )  [place] 11/26/2017 11:19 AM

## 2017-11-26 NOTE — Progress Notes (Signed)
PROGRESS NOTE  Darren Burke KNL:[id number] DOB: 27-May-1946 DOA: 11/24/2017 PCP: Rosita Fire, MD Brief History:  72 year old male with a history of COPD, hypertension, atrial flutter, GERD, CKD stage III presenting with 4-day history of worsening shortness of breath, coughing, and wheezing.  The patient visited the emergency department on 11/24/2017.  The patient improved clinically after bronchodilator treatment.  He was discharged home in stable condition.  Unfortunately, the patient became short of breath once again with coughing and wheezing.  He denies any fevers, chills, chest pain, nausea, vomiting, diarrhea, abdominal pain.  He denies any headache or neck pain or sore throat.  He tried using his home inhaler without improvement.  Upon presentation, the patient was noted to be hypoxic with oxygen saturation 88% on room air with low-grade temperature of 99.0 F.  He was noted to have WBC 16.0, but was hemodynamically stable.  Assessment/Plan: Acute respiratory failure with hypoxia -Secondary to COPD exacerbation -Wean oxygen for saturation greater 92% -Pulmonary hygiene -Presently stable on 2 L nasal cannula -personally reviewed CXR--chronic interstitial markings, no infiltrates -personally reviewed EKG--sinus, lot artifact, nonspecific ST changes  COPD exacerbation -Continue Pulmicort -Continue duo nebs -Continue IV Solu-Medrol -Check viral respiratory panel--neg -80-pack-year history  -check PCT--0.45  Pyuria -Continue ceftriaxone pending urine culture data -urine culture mixed flora -d/c ceftriaxone  CKD stage 4 -Baseline creatinine 2.5-2.8  -am BMP  Essential hypertension -Continue carvedilol    Disposition Plan:   Home 11/27/17 if stable Family Communication:  No Family at bedside  Consultants:  none  Code Status:  FULL  DVT Prophylaxis:  [place] Lovenox   Procedures: As Listed in Progress Note Above  Antibiotics: Azithromycin  1/30>>> Ceftriaxone 1/30>>>       Subjective: Patient states he is breathing 60% better.  He still has some dyspnea with mild exertion.  He states that his cough is gradually improving.  He denies any fevers, chills, chest pain, nausea, vomiting, diarrhea, abdominal pain.  No dysuria or hematuria.  No rashes.  Objective: Vitals:   11/25/17 2108 11/26/17 0433 11/26/17 0946 11/26/17 1429  BP: 119/61 135/74  134/74  Pulse: 93 89  90  Resp: 18 18  18   Temp: (!) 97.3 F (36.3 C) 97.8 F (36.6 C)  98.3 F (36.8 C)  TempSrc: Oral Oral  Oral  SpO2: 96% 94% 94% 95%  Weight:      Height:        Intake/Output Summary (Last 24 hours) at 11/26/2017 1454 Last data filed at 11/26/2017 1000 Gross per 24 hour  Intake 2867.5 ml  Output -  Net 2867.5 ml   Weight change:  Exam:   General:  Pt is alert, follows commands appropriately, not in acute distress  HEENT: No icterus, No thrush, No neck mass, [place]/AT  Cardiovascular: RRR, S1/S2, no rubs, no gallops  Respiratory: Bibasilar crackles.  Bibasilar wheeze.  Abdomen: Soft/+BS, non tender, non distended, no guarding  Extremities: trace LE edema, No lymphangitis, No petechiae, No rashes, no synovitis   Data Reviewed: I have personally reviewed following labs and imaging studies Basic Metabolic Panel: Recent Labs  Lab 11/24/17 1539 11/25/17 0437 11/26/17 0524  NA 141 138 135  K 3.9 4.2 4.5  CL 104 105 104  CO2 23 22 20*  GLUCOSE 123* 139* 148*  BUN 28* 38* 53*  CREATININE 2.14* 2.60* 2.75*  CALCIUM 9.3 8.6* 8.5*   Liver Function Tests: No results for input(s): AST, ALT, ALKPHOS,  BILITOT, PROT, ALBUMIN in the last 168 hours. No results for input(s): LIPASE, AMYLASE in the last 168 hours. No results for input(s): AMMONIA in the last 168 hours. Coagulation Profile: No results for input(s): INR, PROTIME in the last 168 hours. CBC: Recent Labs  Lab 11/24/17 1539 11/25/17 0437 11/26/17 0524  WBC 16.0* 15.5* 16.1*    NEUTROABS 13.8*  --  15.0*  HGB 12.2* 10.4* 9.9*  HCT 38.4* 33.2* 31.2*  MCV 95.8 96.2 96.3  PLT 262 223 228   Cardiac Enzymes: Recent Labs  Lab 11/24/17 1539  TROPONINI <0.03   BNP: Invalid input(s): POCBNP CBG: No results for input(s): GLUCAP in the last 168 hours. HbA1C: Recent Labs    11/25/17 0437  HGBA1C 5.8*   Urine analysis:    Component Value Date/Time   COLORURINE YELLOW 11/24/2017 1518   APPEARANCEUR HAZY (A) 11/24/2017 1518   LABSPEC 1.014 11/24/2017 1518   PHURINE 5.0 11/24/2017 1518   GLUCOSEU NEGATIVE 11/24/2017 1518   HGBUR LARGE (A) 11/24/2017 1518   BILIRUBINUR NEGATIVE 11/24/2017 1518   KETONESUR NEGATIVE 11/24/2017 1518   PROTEINUR 100 (A) 11/24/2017 1518   UROBILINOGEN 0.2 11/10/2014 0400   NITRITE NEGATIVE 11/24/2017 1518   LEUKOCYTESUR MODERATE (A) 11/24/2017 1518   Sepsis Labs: @LABRCNTIP (procalcitonin:4,lacticidven:4) ) Recent Results (from the past 240 hour(s))  Urine culture     Status: Abnormal   Collection Time: 11/24/17  7:43 PM  Result Value Ref Range Status   Specimen Description   Final    URINE, RANDOM Performed at [facility], [address], [place], [place] [address]    Special Requests   Final    NONE Performed at [facility], [address], [place], [place] [address]    Culture MULTIPLE SPECIES PRESENT, SUGGEST RECOLLECTION (A)  Final   Report Status 11/26/2017 FINAL  Final  Culture, blood (routine x 2) Call MD if unable to obtain prior to antibiotics being given     Status: None (Preliminary result)   Collection Time: 11/24/17  7:57 PM  Result Value Ref Range Status   Specimen Description BLOOD LEFT ARM  Final   Special Requests   Final    BOTTLES DRAWN AEROBIC AND ANAEROBIC Blood Culture adequate volume   Culture   Final    NO GROWTH 2 DAYS Performed at [facility], [address], [place], [place] [address]    Report Status PENDING  Incomplete  Culture, blood (routine x 2) Call MD if unable to obtain prior  to antibiotics being given     Status: None (Preliminary result)   Collection Time: 11/24/17  8:01 PM  Result Value Ref Range Status   Specimen Description BLOOD LEFT HAND  Final   Special Requests   Final    BOTTLES DRAWN AEROBIC AND ANAEROBIC Blood Culture adequate volume   Culture   Final    NO GROWTH 2 DAYS Performed at [facility], [address], [place], [place] [address]    Report Status PENDING  Incomplete  Respiratory Panel by PCR     Status: None   Collection Time: 11/24/17 11:28 PM  Result Value Ref Range Status   Adenovirus NOT DETECTED NOT DETECTED Final   Coronavirus 229E NOT DETECTED NOT DETECTED Final   Coronavirus HKU1 NOT DETECTED NOT DETECTED Final   Coronavirus NL63 NOT DETECTED NOT DETECTED Final   Coronavirus OC43 NOT DETECTED NOT DETECTED Final   Metapneumovirus NOT DETECTED NOT DETECTED Final   Rhinovirus / Enterovirus NOT DETECTED NOT DETECTED  Final   Influenza A NOT DETECTED NOT DETECTED Final   Influenza B NOT DETECTED NOT DETECTED Final   Parainfluenza Virus 1 NOT DETECTED NOT DETECTED Final   Parainfluenza Virus 2 NOT DETECTED NOT DETECTED Final   Parainfluenza Virus 3 NOT DETECTED NOT DETECTED Final   Parainfluenza Virus 4 NOT DETECTED NOT DETECTED Final   Respiratory Syncytial Virus NOT DETECTED NOT DETECTED Final   Bordetella pertussis NOT DETECTED NOT DETECTED Final   Chlamydophila pneumoniae NOT DETECTED NOT DETECTED Final   Mycoplasma pneumoniae NOT DETECTED NOT DETECTED Final  Culture, sputum-assessment     Status: None   Collection Time: 11/26/17 12:22 AM  Result Value Ref Range Status   Specimen Description EXPECTORATED SPUTUM  Final   Special Requests NONE  Final   Sputum evaluation THIS SPECIMEN IS ACCEPTABLE FOR SPUTUM CULTURE  Final   Report Status 11/26/2017 FINAL  Final     Scheduled Meds: . aspirin EC  81 mg Oral Daily  . azithromycin  500 mg Oral QHS  . budesonide (PULMICORT) nebulizer solution  0.5 mg Nebulization BID  .  carvedilol  3.125 mg Oral BID WC  . enoxaparin (LOVENOX) injection  40 mg Subcutaneous Q24H  . fluticasone  2 spray Each Nare Daily  . guaiFENesin  600 mg Oral BID  . guaiFENesin  600 mg Oral BID  . ipratropium-albuterol  3 mL Nebulization QID  . methylPREDNISolone (SOLU-MEDROL) injection  60 mg Intravenous Q6H  . pantoprazole  40 mg Oral Daily  . polyethylene glycol  17 g Oral Daily  . senna  2 tablet Oral Daily  . sodium chloride flush  3 mL Intravenous Q12H   Continuous Infusions: . sodium chloride      Procedures/Studies: Dg Chest 2 View  Result Date: 11/24/2017 CLINICAL DATA:  Shortness of breath beginning tonight. History of COPD. Former smoker. EXAM: CHEST  2 VIEW COMPARISON:  12/10/2016 FINDINGS: Normal heart size and pulmonary vascularity. Emphysematous changes in the lungs. Central interstitial pattern suggesting chronic bronchitis. No airspace disease or consolidation. No blunting of costophrenic angles. No pneumothorax. Degenerative changes in the spine. IMPRESSION: Emphysematous and chronic bronchitic changes in the lungs. No evidence of active pulmonary disease. Electronically Signed   By: Lucienne Capers M.D.   On: 11/24/2017 02:05   Dg Chest Port 1 View  Result Date: 11/24/2017 CLINICAL DATA:  Shortness of breath beginning this morning. EXAM: PORTABLE CHEST 1 VIEW COMPARISON:  PA and lateral chest 11/24/2017 and 12/10/2016. FINDINGS: The lungs are emphysematous but clear. No pneumothorax or pleural effusion. No acute bony abnormality. IMPRESSION: Emphysema without acute disease. Electronically Signed   By: Inge Rise M.D.   On: 11/24/2017 15:42   Dg Abd 2 Views  Result Date: 11/24/2017 CLINICAL DATA:  Increased shortness of breath. COPD. Previous smoker. Abdominal bloating. Weakness. EXAM: ABDOMEN - 2 VIEW COMPARISON:  Abdominal series 02/06/2014 FINDINGS: Scattered gas and stool in the colon. No small or large bowel distention. No free intra-abdominal air. No  abnormal air-fluid levels. No radiopaque stones. Visualized bones appear intact. IMPRESSION: Nonobstructive bowel gas pattern. Electronically Signed   By: Lucienne Capers M.D.   On: 11/24/2017 05:32    Orson Eva, DO  [facility] Pager [phone]  If 7PM-7AM, please contact night-coverage [email or web address] Password [email or web address] 11/26/2017, 2:54 PM   LOS: 2 days

## 2017-11-26 NOTE — Care Management Important Message (Signed)
Important Message  Patient Details  Name: GABRIAN HOQUE MRN: [id number] Date of Birth: 1946/01/19   Medicare Important Message Given:  Yes    Criselda Starke, Chauncey Reading, RN 11/26/2017, 12:13 PM

## 2017-11-27 MED ORDER — ALBUTEROL SULFATE HFA 108 (90 BASE) MCG/ACT IN AERS: 2.0000 | INHALATION_SPRAY | Freq: Four times a day (QID) | RESPIRATORY_TRACT | 0 refills | Status: AC | PRN

## 2017-11-27 MED ORDER — PREDNISONE 10 MG PO TABS: 60.0000 mg | ORAL_TABLET | Freq: Every day | ORAL | 0 refills | Status: DC

## 2017-11-27 MED ORDER — SENNA 8.6 MG PO TABS: 2.0000 | ORAL_TABLET | Freq: Every day | ORAL | 0 refills | Status: DC

## 2017-11-27 MED ORDER — AZITHROMYCIN 500 MG PO TABS: 500.0000 mg | ORAL_TABLET | Freq: Every day | ORAL | 0 refills | Status: DC

## 2017-11-27 MED ORDER — POLYETHYLENE GLYCOL 3350 17 G PO PACK: 17.0000 g | PACK | Freq: Every day | ORAL | 0 refills | Status: DC

## 2017-11-27 NOTE — Discharge Summary (Signed)
Physician Discharge Summary  Darren Burke GGY:[id number] DOB: 1946/05/06 DOA: 11/24/2017  PCP: Rosita Fire, MD  Admit date: 11/24/2017 Discharge date: 11/27/2017  Admitted From: Home Disposition:  Home   Recommendations for Outpatient Follow-up:  1. Follow up with PCP in 1-2 weeks 2. Please obtain BMP/CBC in one week     Discharge Condition: Stable CODE STATUS: FULL Diet recommendation: Heart Healthy   Brief/Interim Summary: 72 year old male with a history of COPD, hypertension, atrial flutter, GERD, CKD stage III presenting with 4-day history of worsening shortness of breath, coughing, and wheezing. The patient visited the emergency department on 11/24/2017. The patient improved clinically after bronchodilator treatment. He was discharged home in stable condition. Unfortunately, the patient became short of breath once again with coughing and wheezing. He denies any fevers, chills, chest pain, nausea, vomiting, diarrhea, abdominal pain. He denies any headache or neck pain or sore throat. He tried using his home inhaler without improvement. Upon presentation, the patient was noted to be hypoxic with oxygen saturation 88% on room air with low-grade temperature of 99.0 F. He was noted to have WBC 16.0, but was hemodynamically stable.    Discharge Diagnoses:  Acute respiratory failure with hypoxia -Secondary to COPD exacerbation -Wean oxygen for saturation greater 92% -Pulmonary hygiene -initially on 2 L nasal cannula>>>RA -personally reviewed CXR--chronic interstitial markings, no infiltrates -personally reviewed EKG--sinus, lot artifact, nonspecific ST changes -ambulatory pulse ox on day of d/c did not show desaturation <88%  COPD exacerbation -Continue Pulmicort -Continue duo nebs -Continue IV Solu-Medrol>>>home with prednisone taper -Check viral respiratory panel--neg -80-pack-year history  -check PCT--0.45 -home with azithromycin x 2 more days to finish 5  days tx  Pyuria -Continue ceftriaxone pending urine culture data -urine culture mixed flora -d/c ceftriaxone  CKD stage4 -Baseline creatinine 2.5-2.8 -am BMP  Essential hypertension -Continue carvedilol  Impaired glucose tolerance -Hemoglobin A1c 5.8 -Lifestyle modification   Discharge Instructions  Discharge Instructions    Diet - low sodium heart healthy   Complete by:  As directed    Increase activity slowly   Complete by:  As directed      Allergies as of 11/27/2017      Reactions   Penicillins Hives, Itching   Has patient had a PCN reaction causing immediate rash, facial/tongue/throat swelling, SOB or lightheadedness with hypotension: no Has patient had a PCN reaction causing severe rash involving mucus membranes or skin necrosis: no Has patient had a PCN reaction that required hospitalization: no Has patient had a PCN reaction occurring within the last 10 years: no If all of the above answers are "NO", then may proceed with Cephalosporin use.      Medication List    TAKE these medications   albuterol 108 (90 Base) MCG/ACT inhaler Commonly known as:  PROVENTIL HFA;VENTOLIN HFA Inhale 2 puffs into the lungs every 6 (six) hours as needed for wheezing or shortness of breath.   aspirin 81 MG EC tablet Take 1 tablet (81 mg total) by mouth daily.   azithromycin 500 MG tablet Commonly known as:  ZITHROMAX Take 1 tablet (500 mg total) by mouth daily.   carvedilol 3.125 MG tablet Commonly known as:  COREG Take 1 tablet (3.125 mg total) by mouth 2 (two) times daily with a meal.   polyethylene glycol packet Commonly known as:  MIRALAX / GLYCOLAX Take 17 g by mouth daily. Start taking on:  11/28/2017   predniSONE 10 MG tablet Commonly known as:  DELTASONE Take 6 tablets (60 mg total) by  mouth daily with breakfast. And decrease by one tablet daily Start taking on:  11/28/2017   senna 8.6 MG Tabs tablet Commonly known as:  SENOKOT Take 2 tablets (17.2 mg  total) by mouth daily. Start taking on:  11/28/2017   umeclidinium bromide 62.5 MCG/INH Aepb Commonly known as:  INCRUSE ELLIPTA Inhale 1 puff into the lungs daily.       Allergies  Allergen Reactions  . Penicillins Hives and Itching    Has patient had a PCN reaction causing immediate rash, facial/tongue/throat swelling, SOB or lightheadedness with hypotension: no Has patient had a PCN reaction causing severe rash involving mucus membranes or skin necrosis: no Has patient had a PCN reaction that required hospitalization: no Has patient had a PCN reaction occurring within the last 10 years: no If all of the above answers are "NO", then may proceed with Cephalosporin use.    Consultations:  none   Procedures/Studies: Dg Chest 2 View  Result Date: 11/24/2017 CLINICAL DATA:  Shortness of breath beginning tonight. History of COPD. Former smoker. EXAM: CHEST  2 VIEW COMPARISON:  12/10/2016 FINDINGS: Normal heart size and pulmonary vascularity. Emphysematous changes in the lungs. Central interstitial pattern suggesting chronic bronchitis. No airspace disease or consolidation. No blunting of costophrenic angles. No pneumothorax. Degenerative changes in the spine. IMPRESSION: Emphysematous and chronic bronchitic changes in the lungs. No evidence of active pulmonary disease. Electronically Signed   By: Lucienne Capers M.D.   On: 11/24/2017 02:05   Dg Chest Port 1 View  Result Date: 11/24/2017 CLINICAL DATA:  Shortness of breath beginning this morning. EXAM: PORTABLE CHEST 1 VIEW COMPARISON:  PA and lateral chest 11/24/2017 and 12/10/2016. FINDINGS: The lungs are emphysematous but clear. No pneumothorax or pleural effusion. No acute bony abnormality. IMPRESSION: Emphysema without acute disease. Electronically Signed   By: Inge Rise M.D.   On: 11/24/2017 15:42   Dg Abd 2 Views  Result Date: 11/24/2017 CLINICAL DATA:  Increased shortness of breath. COPD. Previous smoker. Abdominal  bloating. Weakness. EXAM: ABDOMEN - 2 VIEW COMPARISON:  Abdominal series 02/06/2014 FINDINGS: Scattered gas and stool in the colon. No small or large bowel distention. No free intra-abdominal air. No abnormal air-fluid levels. No radiopaque stones. Visualized bones appear intact. IMPRESSION: Nonobstructive bowel gas pattern. Electronically Signed   By: Lucienne Capers M.D.   On: 11/24/2017 05:32        Discharge Exam: Vitals:   11/27/17 0439 11/27/17 0742  BP: 120/65   Pulse: 71   Resp: 16   Temp: 97.9 F (36.6 C)   SpO2: 94% 92%   Vitals:   11/26/17 2003 11/26/17 2105 11/27/17 0439 11/27/17 0742  BP:  127/66 120/65   Pulse:  81 71   Resp:  20 16   Temp:  98.2 F (36.8 C) 97.9 F (36.6 C)   TempSrc:  Oral Oral   SpO2: 91% 92% 94% 92%  Weight:      Height:        General: Pt is alert, awake, not in acute distress Cardiovascular: RRR, S1/S2 +, no rubs, no gallops Respiratory: CTA bilaterally, no wheezing, no rhonchi Abdominal: Soft, NT, ND, bowel sounds + Extremities: no edema, no cyanosis   The results of significant diagnostics from this hospitalization (including imaging, microbiology, ancillary and laboratory) are listed below for reference.    Significant Diagnostic Studies: Dg Chest 2 View  Result Date: 11/24/2017 CLINICAL DATA:  Shortness of breath beginning tonight. History of COPD. Former smoker. EXAM: CHEST  2 VIEW COMPARISON:  12/10/2016 FINDINGS: Normal heart size and pulmonary vascularity. Emphysematous changes in the lungs. Central interstitial pattern suggesting chronic bronchitis. No airspace disease or consolidation. No blunting of costophrenic angles. No pneumothorax. Degenerative changes in the spine. IMPRESSION: Emphysematous and chronic bronchitic changes in the lungs. No evidence of active pulmonary disease. Electronically Signed   By: Lucienne Capers M.D.   On: 11/24/2017 02:05   Dg Chest Port 1 View  Result Date: 11/24/2017 CLINICAL DATA:   Shortness of breath beginning this morning. EXAM: PORTABLE CHEST 1 VIEW COMPARISON:  PA and lateral chest 11/24/2017 and 12/10/2016. FINDINGS: The lungs are emphysematous but clear. No pneumothorax or pleural effusion. No acute bony abnormality. IMPRESSION: Emphysema without acute disease. Electronically Signed   By: Inge Rise M.D.   On: 11/24/2017 15:42   Dg Abd 2 Views  Result Date: 11/24/2017 CLINICAL DATA:  Increased shortness of breath. COPD. Previous smoker. Abdominal bloating. Weakness. EXAM: ABDOMEN - 2 VIEW COMPARISON:  Abdominal series 02/06/2014 FINDINGS: Scattered gas and stool in the colon. No small or large bowel distention. No free intra-abdominal air. No abnormal air-fluid levels. No radiopaque stones. Visualized bones appear intact. IMPRESSION: Nonobstructive bowel gas pattern. Electronically Signed   By: Lucienne Capers M.D.   On: 11/24/2017 05:32     Microbiology: Recent Results (from the past 240 hour(s))  Urine culture     Status: Abnormal   Collection Time: 11/24/17  7:43 PM  Result Value Ref Range Status   Specimen Description   Final    URINE, RANDOM Performed at [facility], [address] [place], [place] [address]    Special Requests   Final    NONE Performed at [facility], [address] [place], [place] [address]    Culture MULTIPLE SPECIES PRESENT, SUGGEST RECOLLECTION (A)  Final   Report Status 11/26/2017 FINAL  Final  Culture, blood (routine x 2) Call MD if unable to obtain prior to antibiotics being given     Status: None (Preliminary result)   Collection Time: 11/24/17  7:57 PM  Result Value Ref Range Status   Specimen Description BLOOD LEFT ARM  Final   Special Requests   Final    BOTTLES DRAWN AEROBIC AND ANAEROBIC Blood Culture adequate volume   Culture   Final    NO GROWTH 3 DAYS Performed at [facility], [address] [place], [place] [address]    Report Status PENDING  Incomplete  Culture, blood (routine x 2) Call MD if unable to  obtain prior to antibiotics being given     Status: None (Preliminary result)   Collection Time: 11/24/17  8:01 PM  Result Value Ref Range Status   Specimen Description BLOOD LEFT HAND  Final   Special Requests   Final    BOTTLES DRAWN AEROBIC AND ANAEROBIC Blood Culture adequate volume   Culture   Final    NO GROWTH 3 DAYS Performed at [facility], [address] [place], [place] [address]    Report Status PENDING  Incomplete  Respiratory Panel by PCR     Status: None   Collection Time: 11/24/17 11:28 PM  Result Value Ref Range Status   Adenovirus NOT DETECTED NOT DETECTED Final   Coronavirus 229E NOT DETECTED NOT DETECTED Final   Coronavirus HKU1 NOT DETECTED NOT DETECTED Final   Coronavirus NL63 NOT DETECTED NOT DETECTED Final   Coronavirus OC43 NOT DETECTED NOT DETECTED Final   Metapneumovirus NOT DETECTED NOT DETECTED Final   Rhinovirus /  Enterovirus NOT DETECTED NOT DETECTED Final   Influenza A NOT DETECTED NOT DETECTED Final   Influenza B NOT DETECTED NOT DETECTED Final   Parainfluenza Virus 1 NOT DETECTED NOT DETECTED Final   Parainfluenza Virus 2 NOT DETECTED NOT DETECTED Final   Parainfluenza Virus 3 NOT DETECTED NOT DETECTED Final   Parainfluenza Virus 4 NOT DETECTED NOT DETECTED Final   Respiratory Syncytial Virus NOT DETECTED NOT DETECTED Final   Bordetella pertussis NOT DETECTED NOT DETECTED Final   Chlamydophila pneumoniae NOT DETECTED NOT DETECTED Final   Mycoplasma pneumoniae NOT DETECTED NOT DETECTED Final  Culture, sputum-assessment     Status: None   Collection Time: 11/26/17 12:22 AM  Result Value Ref Range Status   Specimen Description EXPECTORATED SPUTUM  Final   Special Requests NONE  Final   Sputum evaluation THIS SPECIMEN IS ACCEPTABLE FOR SPUTUM CULTURE  Final   Report Status 11/26/2017 FINAL  Final  Culture, respiratory (NON-Expectorated)     Status: None (Preliminary result)   Collection Time: 11/26/17 12:22 AM  Result Value Ref Range Status    Specimen Description   Final    EXPECTORATED SPUTUM Performed at [facility], [address] [place], [place] [address]    Special Requests   Final    NONE Reflexed from [id number] Performed at [facility], [address], [place], [place] [address]    Gram Stain   Final    FEW WBC PRESENT,BOTH PMN AND MONONUCLEAR FEW SQUAMOUS EPITHELIAL CELLS PRESENT FEW GRAM POSITIVE COCCI Performed at [facility], [place] [address] [place], [place] [address]    Culture PENDING  Incomplete   Report Status PENDING  Incomplete     Labs: Basic Metabolic Panel: Recent Labs  Lab 11/24/17 1539 11/25/17 0437 11/26/17 0524  NA 141 138 135  K 3.9 4.2 4.5  CL 104 105 104  CO2 23 22 20*  GLUCOSE 123* 139* 148*  BUN 28* 38* 53*  CREATININE 2.14* 2.60* 2.75*  CALCIUM 9.3 8.6* 8.5*   Liver Function Tests: No results for input(s): AST, ALT, ALKPHOS, BILITOT, PROT, ALBUMIN in the last 168 hours. No results for input(s): LIPASE, AMYLASE in the last 168 hours. No results for input(s): AMMONIA in the last 168 hours. CBC: Recent Labs  Lab 11/24/17 1539 11/25/17 0437 11/26/17 0524  WBC 16.0* 15.5* 16.1*  NEUTROABS 13.8*  --  15.0*  HGB 12.2* 10.4* 9.9*  HCT 38.4* 33.2* 31.2*  MCV 95.8 96.2 96.3  PLT 262 223 228   Cardiac Enzymes: Recent Labs  Lab 11/24/17 1539  TROPONINI <0.03   BNP: Invalid input(s): POCBNP CBG: No results for input(s): GLUCAP in the last 168 hours.  Time coordinating discharge:  Greater than 30 minutes  Signed:  Orson Eva, DO [facility] Pager: [phone] 11/27/2017, 11:15 AM

## 2017-11-27 NOTE — Progress Notes (Signed)
Patient is to be discharged home and in stable condition. Patient's IV and telemetry removed, WNL. Patient given discharge instructions and verbalized understanding. Patient will be escorted out by staff via wheelchair.  Sela Falk P Dish[place], RN  

## 2017-11-28 LAB — CULTURE, RESPIRATORY

## 2017-11-28 LAB — CULTURE, RESPIRATORY W GRAM STAIN: Culture: NORMAL

## 2017-11-29 LAB — CULTURE, BLOOD (ROUTINE X 2)
CULTURE: NO GROWTH
CULTURE: NO GROWTH
SPECIAL REQUESTS: ADEQUATE
Special Requests: ADEQUATE

## 2018-02-03 ENCOUNTER — Emergency Department (HOSPITAL_COMMUNITY): Payer: Medicare Other

## 2018-02-03 ENCOUNTER — Emergency Department (HOSPITAL_COMMUNITY)
Admission: EM | Admit: 2018-02-03 | Discharge: 2018-02-03 | Disposition: A | Discharge status: Home / Self Care | Payer: Medicare Other | Attending: Emergency Medicine | Admitting: Emergency Medicine

## 2018-02-03 ENCOUNTER — Other Ambulatory Visit: Payer: Self-pay

## 2018-02-03 ENCOUNTER — Encounter (HOSPITAL_COMMUNITY): Payer: Self-pay

## 2018-02-03 DIAGNOSIS — J449 Chronic obstructive pulmonary disease, unspecified: Secondary | ICD-10-CM | POA: Insufficient documentation

## 2018-02-03 DIAGNOSIS — Z79899 Other long term (current) drug therapy: Secondary | ICD-10-CM | POA: Diagnosis not present

## 2018-02-03 DIAGNOSIS — R1030 Lower abdominal pain, unspecified: Secondary | ICD-10-CM | POA: Insufficient documentation

## 2018-02-03 DIAGNOSIS — Z87891 Personal history of nicotine dependence: Secondary | ICD-10-CM | POA: Insufficient documentation

## 2018-02-03 DIAGNOSIS — R0602 Shortness of breath: Secondary | ICD-10-CM | POA: Diagnosis not present

## 2018-02-03 DIAGNOSIS — K5900 Constipation, unspecified: Secondary | ICD-10-CM | POA: Diagnosis not present

## 2018-02-03 DIAGNOSIS — I129 Hypertensive chronic kidney disease with stage 1 through stage 4 chronic kidney disease, or unspecified chronic kidney disease: Secondary | ICD-10-CM | POA: Diagnosis not present

## 2018-02-03 DIAGNOSIS — K5730 Diverticulosis of large intestine without perforation or abscess without bleeding: Secondary | ICD-10-CM | POA: Diagnosis not present

## 2018-02-03 DIAGNOSIS — Z7982 Long term (current) use of aspirin: Secondary | ICD-10-CM | POA: Diagnosis not present

## 2018-02-03 DIAGNOSIS — N184 Chronic kidney disease, stage 4 (severe): Secondary | ICD-10-CM | POA: Diagnosis not present

## 2018-02-03 DIAGNOSIS — R140 Abdominal distension (gaseous): Secondary | ICD-10-CM | POA: Diagnosis not present

## 2018-02-03 LAB — URINALYSIS, ROUTINE W REFLEX MICROSCOPIC
Bilirubin Urine: NEGATIVE
Glucose, UA: NEGATIVE mg/dL
KETONES UR: NEGATIVE mg/dL
NITRITE: NEGATIVE
PROTEIN: 30 mg/dL — AB
Specific Gravity, Urine: 1.013 (ref 1.005–1.030)
pH: 5 (ref 5.0–8.0)

## 2018-02-03 LAB — CBC WITH DIFFERENTIAL/PLATELET
BASOS PCT: 1 %
Basophils Absolute: 0.1 10*3/uL (ref 0.0–0.1)
EOS PCT: 11 %
Eosinophils Absolute: 0.8 10*3/uL — ABNORMAL HIGH (ref 0.0–0.7)
HCT: 34.7 % — ABNORMAL LOW (ref 39.0–52.0)
Hemoglobin: 11.1 g/dL — ABNORMAL LOW (ref 13.0–17.0)
Lymphocytes Relative: 31 %
Lymphs Abs: 2.2 10*3/uL (ref 0.7–4.0)
MCH: 30.2 pg (ref 26.0–34.0)
MCHC: 32 g/dL (ref 30.0–36.0)
MCV: 94.3 fL (ref 78.0–100.0)
MONO ABS: 0.5 10*3/uL (ref 0.1–1.0)
MONOS PCT: 7 %
NEUTROS ABS: 3.6 10*3/uL (ref 1.7–7.7)
Neutrophils Relative %: 51 %
PLATELETS: 204 10*3/uL (ref 150–400)
RBC: 3.68 MIL/uL — ABNORMAL LOW (ref 4.22–5.81)
RDW: 12.8 % (ref 11.5–15.5)
WBC: 7.1 10*3/uL (ref 4.0–10.5)

## 2018-02-03 LAB — URINALYSIS, MICROSCOPIC (REFLEX)

## 2018-02-03 LAB — COMPREHENSIVE METABOLIC PANEL
ALBUMIN: 3.6 g/dL (ref 3.5–5.0)
ALT: 17 U/L (ref 17–63)
ANION GAP: 12 (ref 5–15)
AST: 15 U/L (ref 15–41)
Alkaline Phosphatase: 55 U/L (ref 38–126)
BILIRUBIN TOTAL: 0.8 mg/dL (ref 0.3–1.2)
BUN: 31 mg/dL — ABNORMAL HIGH (ref 6–20)
CHLORIDE: 104 mmol/L (ref 101–111)
CO2: 23 mmol/L (ref 22–32)
Calcium: 9.1 mg/dL (ref 8.9–10.3)
Creatinine, Ser: 2.38 mg/dL — ABNORMAL HIGH (ref 0.61–1.24)
GFR calc Af Amer: 30 mL/min — ABNORMAL LOW (ref >60)
GFR, EST NON AFRICAN AMERICAN: 26 mL/min — AB (ref >60)
Glucose, Bld: 98 mg/dL (ref 65–99)
POTASSIUM: 3.8 mmol/L (ref 3.5–5.1)
Sodium: 139 mmol/L (ref 135–145)
TOTAL PROTEIN: 7.5 g/dL (ref 6.5–8.1)

## 2018-02-03 LAB — LIPASE, BLOOD: LIPASE: 30 U/L (ref 11–51)

## 2018-02-03 MED ORDER — LACTULOSE 10 GM/15ML PO SOLN: 20.0000 g | Freq: Two times a day (BID) | ORAL | 0 refills | Status: DC | PRN

## 2018-02-03 MED ORDER — MAGNESIUM CITRATE PO SOLN: 1.0000 | Freq: Once | ORAL | 0 refills | Status: AC

## 2018-02-03 MED ORDER — IPRATROPIUM-ALBUTEROL 0.5-2.5 (3) MG/3ML IN SOLN
RESPIRATORY_TRACT | Status: AC
  Administered 2018-02-03: 3 mL
  Filled 2018-02-03: qty 3

## 2018-02-03 NOTE — ED Notes (Signed)
Patient transported to CT 

## 2018-02-03 NOTE — ED Notes (Signed)
Pt returned from CT Scan 

## 2018-02-03 NOTE — ED Provider Notes (Signed)
[facility] EMERGENCY DEPARTMENT Provider Note   CSN: [id number] Arrival date & time: 02/03/18  2409     History   Chief Complaint Chief Complaint  Patient presents with  . Shortness of Breath    HPI Darren Burke is a 72 y.o. male.  Patient presents to the ER for evaluation of abdominal distention and shortness of breath.  Patient reports that he started feeling constipated 4 days ago.  At that time he took a laxative and had a small bowel movement, but has not had a bowel movement since.  He has been feeling queasy but has not had vomiting.  No associated diarrhea, rectal bleeding.  He reports that his abdomen has gotten progressively distended and now he feels short of breath.  He does have a history of COPD, using breathing treatment before coming without relief.  He thinks his abdominal distention is making him feel somewhat short of breath.  No cough or chest congestion.  No fever.     Past Medical History:  Diagnosis Date  . COPD (chronic obstructive pulmonary disease) (HCC)    Emphysema radiographically  . GERD (gastroesophageal reflux disease)   . Hypertension   . Paroxysmal atrial flutter [facility])     Patient Active Problem List   Diagnosis Date Noted  . CKD (chronic kidney disease), stage IV ([place]) 11/25/2017  . CKD (chronic kidney disease), stage III ([place]) 11/24/2017  . HTN (hypertension) 12/10/2016  . AKI (acute kidney injury) ([place]) 12/10/2016  . Paroxysmal atrial flutter ([place]) 12/10/2016  . Anemia 12/10/2016  . Acute bacterial bronchitis 12/10/2016  . Pneumonia 11/10/2014  . Hypoxia   . Acute on chronic respiratory failure with hypoxia ([place])   . COPD with acute exacerbation ([place]) 10/05/2013  . PNA (pneumonia) 10/05/2013  . Acute respiratory failure with hypoxia ([place]) 10/05/2013  . Sinus tachycardia 10/05/2013  . SOB (shortness of breath) 10/05/2013  . CAP (community acquired pneumonia) 10/05/2013  . Hyperglycemia, drug-induced 10/05/2013  . Pulmonary  emphysema ([place])     Past Surgical History:  Procedure Laterality Date  . ARTERY REPAIR     sinus        Home Medications    Prior to Admission medications   Medication Sig Start Date End Date Taking? Authorizing Provider  albuterol (PROVENTIL HFA;VENTOLIN HFA) 108 (90 Base) MCG/ACT inhaler Inhale 2 puffs into the lungs every 6 (six) hours as needed for wheezing or shortness of breath. 11/27/17  Yes Tat, Shanon Brow, MD  aspirin EC 81 MG EC tablet Take 1 tablet (81 mg total) by mouth daily. 12/12/16  Yes Florencia Reasons, MD  azithromycin (ZITHROMAX) 500 MG tablet Take 1 tablet (500 mg total) by mouth daily. 11/27/17  Yes Tat, Shanon Brow, MD  carvedilol (COREG) 3.125 MG tablet Take 1 tablet (3.125 mg total) by mouth 2 (two) times daily with a meal. 12/14/16  Yes Sinda Du, MD  polyethylene glycol (MIRALAX / GLYCOLAX) packet Take 17 g by mouth daily. 11/28/17  Yes Tat, Shanon Brow, MD  predniSONE (DELTASONE) 10 MG tablet Take 6 tablets (60 mg total) by mouth daily with breakfast. And decrease by one tablet daily 11/28/17  Yes Tat, Shanon Brow, MD  senna (SENOKOT) 8.6 MG TABS tablet Take 2 tablets (17.2 mg total) by mouth daily. 11/28/17  Yes Tat, Shanon Brow, MD  umeclidinium bromide (INCRUSE ELLIPTA) 62.5 MCG/INH AEPB Inhale 1 puff into the lungs daily. 12/14/16  Yes Sinda Du, MD    Family History Family History  Problem Relation Age of Onset  .  Heart failure Mother   . Stroke Father     Social History Social History   Tobacco Use  . Smoking status: Former Smoker    Packs/day: 3.00    Years: 40.00    Pack years: 120.00    Types: Cigarettes  . Smokeless tobacco: Never Used  Substance Use Topics  . Alcohol use: No  . Drug use: No     Allergies   Penicillins   Review of Systems Review of Systems  Respiratory: Positive for shortness of breath.   Gastrointestinal: Positive for abdominal distention and constipation.  All other systems reviewed and are negative.    Physical Exam Updated Vital  Signs BP (!) 144/88 (BP Location: Left Arm)   Pulse 84   Temp 98.1 F (36.7 C) (Oral)   Resp 20   Ht 5\' 10"  (1.778 m)   Wt 104.3 kg (230 lb)   SpO2 94%   BMI 33.00 kg/m   Physical Exam  Constitutional: He is oriented to person, place, and time. He appears well-developed and well-nourished. No distress.  HENT:  Head: Normocephalic and atraumatic.  Right Ear: Hearing normal.  Left Ear: Hearing normal.  Nose: Nose normal.  Mouth/Throat: Oropharynx is clear and moist and mucous membranes are normal.  Eyes: Pupils are equal, round, and reactive to light. Conjunctivae and EOM are normal.  Neck: Normal range of motion. Neck supple.  Cardiovascular: Regular rhythm, S1 normal and S2 normal. Exam reveals no gallop and no friction rub.  No murmur heard. Pulmonary/Chest: Effort normal and breath sounds normal. No respiratory distress. He exhibits no tenderness.  Abdominal: Soft. Normal appearance. He exhibits distension. Bowel sounds are increased. There is no hepatosplenomegaly. There is generalized tenderness. There is no rebound, no guarding, no tenderness at McBurney's point and negative Murphy's sign. No hernia.  Musculoskeletal: Normal range of motion.  Neurological: He is alert and oriented to person, place, and time. He has normal strength. No cranial nerve deficit or sensory deficit. Coordination normal. GCS eye subscore is 4. GCS verbal subscore is 5. GCS motor subscore is 6.  Skin: Skin is warm, dry and intact. No rash noted. No cyanosis.  Psychiatric: He has a normal mood and affect. His speech is normal and behavior is normal. Thought content normal.  Nursing note and vitals reviewed.    ED Treatments / Results  Labs (all labs ordered are listed, but only abnormal results are displayed) Labs Reviewed  CBC WITH DIFFERENTIAL/PLATELET - Abnormal; Notable for the following components:      Result Value   RBC 3.68 (*)    Hemoglobin 11.1 (*)    HCT 34.7 (*)    Eosinophils  Absolute 0.8 (*)    All other components within normal limits  COMPREHENSIVE METABOLIC PANEL - Abnormal; Notable for the following components:   BUN 31 (*)    Creatinine, Ser 2.38 (*)    GFR calc non Af Amer 26 (*)    GFR calc Af Amer 30 (*)    All other components within normal limits  URINALYSIS, ROUTINE W REFLEX MICROSCOPIC - Abnormal; Notable for the following components:   Hgb urine dipstick MODERATE (*)    Protein, ur 30 (*)    Leukocytes, UA SMALL (*)    All other components within normal limits  URINALYSIS, MICROSCOPIC (REFLEX) - Abnormal; Notable for the following components:   Bacteria, UA RARE (*)    Squamous Epithelial / LPF 0-5 (*)    All other components within normal limits  LIPASE, BLOOD    EKG None  Radiology Ct Abdomen Pelvis Wo Contrast  Result Date: 02/03/2018 CLINICAL DATA:  Abdominal pain.  Distention. EXAM: CT ABDOMEN AND PELVIS WITHOUT CONTRAST TECHNIQUE: Multidetector CT imaging of the abdomen and pelvis was performed following the standard protocol without IV contrast. COMPARISON:  Radiographs earlier this day.  CT 01/06/2016 FINDINGS: Lower chest: Right lower lobe atelectasis or scarring. No pleural fluid. Emphysema. Hepatobiliary: No focal liver abnormality is seen. No gallstones, gallbladder wall thickening, or biliary dilatation. Pancreas: No ductal dilatation or inflammation. Spleen: Normal in size without focal abnormality. Adrenals/Urinary Tract: No adrenal nodule. No hydronephrosis or urolithiasis. Slight perinephric edema is unchanged from prior exam and appears chronic. Ureters are decompressed without stones along the course. Urinary bladder is physiologically distended without stone or wall thickening. Stomach/Bowel: Stomach partially distended. Small duodenal diverticulum. Mild fecalization of small bowel contents. No small bowel obstruction, wall thickening or inflammation. Normal appendix. Moderate stool throughout the colon. Colonic diverticulosis  without diverticulitis. There is colonic tortuosity. Vascular/Lymphatic: Aorta bi-iliac atherosclerosis. No aneurysm. No adenopathy. Reproductive: No free air, free fluid, or intra-abdominal fluid collection. Fat within both inguinal canals. Other: No free air, free fluid, or intra-abdominal fluid collection. Musculoskeletal: There are no acute or suspicious osseous abnormalities. IMPRESSION: 1. No acute abnormality in the abdomen/pelvis. 2. Moderate colonic stool burden and fecalization of distal small bowel contents suggesting constipation/slow transit. No obstruction. 3. Colonic diverticulosis without diverticulitis. Aortic Atherosclerosis (ICD10-I70.0). Electronically Signed   By: Jeb Levering M.D.   On: 02/03/2018 05:28   Dg Abd Acute W/chest  Result Date: 02/03/2018 CLINICAL DATA:  Lower abdominal pain.  Shortness of breath. EXAM: DG ABDOMEN ACUTE W/ 1V CHEST COMPARISON:  Radiographs 11/24/2017 FINDINGS: Chronic hyperinflation and mild emphysema. Unchanged heart size and mediastinal contours. Minimal bibasilar atelectasis. Calcified nodules in the left upper lung consistent with prior granulomatous disease. No confluent airspace disease. No pulmonary edema or pleural fluid. No bowel dilatation to suggest obstruction. Moderate colonic stool burden. No abnormal rectal distention. No radiopaque calculi. No free air. No acute osseous abnormalities are seen. IMPRESSION: 1. Normal bowel gas pattern. Moderate colonic stool burden suggesting constipation. No fecal impaction. 2. Chronic hyperinflation and emphysema.  No acute chest finding. Electronically Signed   By: Jeb Levering M.D.   On: 02/03/2018 04:20    Procedures Procedures (including critical care time)  Medications Ordered in ED Medications  ipratropium-albuterol (DUONEB) 0.5-2.5 (3) MG/3ML nebulizer solution (3 mLs  Given 02/03/18 0406)     Initial Impression / Assessment and Plan / ED Course  I have reviewed the triage vital signs  and the nursing notes.  Pertinent labs & imaging results that were available during my care of the patient were reviewed by me and considered in my medical decision making (see chart for details).     Patient presents to the ER for evaluation of shortness of breath and abdominal pain.  Patient appears to be breathing comfortably.  No hypoxia, no increased respiratory rate.  He has abdominal distention which is likely causing some of his sensation of feeling like he cannot breathe.  He reports constipation without response to laxative.  X-ray and CAT scan did confirm constipation but no other acute abnormality noted.  Lungs are clear, no wheezing.  No pneumonia on x-ray.  Patient will be appropriate for outpatient treatment for constipation.  Final Clinical Impressions(s) / ED Diagnoses   Final diagnoses:  Constipation, unspecified constipation type    ED Discharge Orders  None       Orpah Greek, MD 02/03/18 [phone]

## 2018-02-03 NOTE — ED Triage Notes (Signed)
Pt arrived from home via POV c/o SOB and abdominal pain. Pt states he feels like he has a lot of gas buildup in his belly and he can't pass any gas. Pt states he gave himself a Albuterol Nebulizer treatment at home prior to arrival.

## 2018-03-24 DIAGNOSIS — Z1389 Encounter for screening for other disorder: Secondary | ICD-10-CM | POA: Diagnosis not present

## 2018-03-24 DIAGNOSIS — Z1331 Encounter for screening for depression: Secondary | ICD-10-CM | POA: Diagnosis not present

## 2018-03-24 DIAGNOSIS — J449 Chronic obstructive pulmonary disease, unspecified: Secondary | ICD-10-CM | POA: Diagnosis not present

## 2018-03-24 DIAGNOSIS — F339 Major depressive disorder, recurrent, unspecified: Secondary | ICD-10-CM | POA: Diagnosis not present

## 2018-03-24 DIAGNOSIS — R739 Hyperglycemia, unspecified: Secondary | ICD-10-CM | POA: Diagnosis not present

## 2018-03-24 DIAGNOSIS — Z0001 Encounter for general adult medical examination with abnormal findings: Secondary | ICD-10-CM | POA: Diagnosis not present

## 2018-03-24 DIAGNOSIS — I10 Essential (primary) hypertension: Secondary | ICD-10-CM | POA: Diagnosis not present

## 2018-03-24 DIAGNOSIS — N184 Chronic kidney disease, stage 4 (severe): Secondary | ICD-10-CM | POA: Diagnosis not present

## 2018-07-21 DIAGNOSIS — J449 Chronic obstructive pulmonary disease, unspecified: Secondary | ICD-10-CM | POA: Diagnosis not present

## 2018-07-21 DIAGNOSIS — R269 Unspecified abnormalities of gait and mobility: Secondary | ICD-10-CM | POA: Diagnosis not present

## 2018-07-21 DIAGNOSIS — D649 Anemia, unspecified: Secondary | ICD-10-CM | POA: Diagnosis not present

## 2018-07-21 DIAGNOSIS — N184 Chronic kidney disease, stage 4 (severe): Secondary | ICD-10-CM | POA: Diagnosis not present

## 2018-07-21 DIAGNOSIS — I10 Essential (primary) hypertension: Secondary | ICD-10-CM | POA: Diagnosis not present

## 2018-07-21 DIAGNOSIS — R739 Hyperglycemia, unspecified: Secondary | ICD-10-CM | POA: Diagnosis not present

## 2018-07-21 DIAGNOSIS — E559 Vitamin D deficiency, unspecified: Secondary | ICD-10-CM | POA: Diagnosis not present

## 2018-07-21 DIAGNOSIS — I480 Paroxysmal atrial fibrillation: Secondary | ICD-10-CM | POA: Diagnosis not present

## 2018-10-04 DIAGNOSIS — N184 Chronic kidney disease, stage 4 (severe): Secondary | ICD-10-CM | POA: Diagnosis not present

## 2018-10-04 DIAGNOSIS — I10 Essential (primary) hypertension: Secondary | ICD-10-CM | POA: Diagnosis not present

## 2018-10-04 DIAGNOSIS — J441 Chronic obstructive pulmonary disease with (acute) exacerbation: Secondary | ICD-10-CM | POA: Diagnosis not present

## 2019-01-09 DIAGNOSIS — J449 Chronic obstructive pulmonary disease, unspecified: Secondary | ICD-10-CM | POA: Diagnosis not present

## 2019-01-09 DIAGNOSIS — Z6832 Body mass index (BMI) 32.0-32.9, adult: Secondary | ICD-10-CM | POA: Diagnosis not present

## 2019-01-09 DIAGNOSIS — N184 Chronic kidney disease, stage 4 (severe): Secondary | ICD-10-CM | POA: Diagnosis not present

## 2019-01-09 DIAGNOSIS — I10 Essential (primary) hypertension: Secondary | ICD-10-CM | POA: Diagnosis not present

## 2019-02-02 DIAGNOSIS — J449 Chronic obstructive pulmonary disease, unspecified: Secondary | ICD-10-CM | POA: Diagnosis not present

## 2019-02-02 DIAGNOSIS — M549 Dorsalgia, unspecified: Secondary | ICD-10-CM | POA: Diagnosis not present

## 2019-02-06 DIAGNOSIS — B029 Zoster without complications: Secondary | ICD-10-CM | POA: Diagnosis not present

## 2019-02-17 DIAGNOSIS — B0223 Postherpetic polyneuropathy: Secondary | ICD-10-CM | POA: Diagnosis not present

## 2019-05-05 DIAGNOSIS — Z1389 Encounter for screening for other disorder: Secondary | ICD-10-CM | POA: Diagnosis not present

## 2019-05-05 DIAGNOSIS — Z1331 Encounter for screening for depression: Secondary | ICD-10-CM | POA: Diagnosis not present

## 2019-05-05 DIAGNOSIS — N184 Chronic kidney disease, stage 4 (severe): Secondary | ICD-10-CM | POA: Diagnosis not present

## 2019-05-05 DIAGNOSIS — J449 Chronic obstructive pulmonary disease, unspecified: Secondary | ICD-10-CM | POA: Diagnosis not present

## 2019-05-05 DIAGNOSIS — I4892 Unspecified atrial flutter: Secondary | ICD-10-CM | POA: Diagnosis not present

## 2019-05-05 DIAGNOSIS — I10 Essential (primary) hypertension: Secondary | ICD-10-CM | POA: Diagnosis not present

## 2019-06-05 DIAGNOSIS — N184 Chronic kidney disease, stage 4 (severe): Secondary | ICD-10-CM | POA: Diagnosis not present

## 2019-06-05 DIAGNOSIS — I10 Essential (primary) hypertension: Secondary | ICD-10-CM | POA: Diagnosis not present

## 2019-07-06 DIAGNOSIS — N184 Chronic kidney disease, stage 4 (severe): Secondary | ICD-10-CM | POA: Diagnosis not present

## 2019-07-06 DIAGNOSIS — J449 Chronic obstructive pulmonary disease, unspecified: Secondary | ICD-10-CM | POA: Diagnosis not present

## 2019-08-04 DIAGNOSIS — N184 Chronic kidney disease, stage 4 (severe): Secondary | ICD-10-CM | POA: Diagnosis not present

## 2019-08-04 DIAGNOSIS — J449 Chronic obstructive pulmonary disease, unspecified: Secondary | ICD-10-CM | POA: Diagnosis not present

## 2019-11-13 DIAGNOSIS — I10 Essential (primary) hypertension: Secondary | ICD-10-CM | POA: Diagnosis not present

## 2019-11-13 DIAGNOSIS — J449 Chronic obstructive pulmonary disease, unspecified: Secondary | ICD-10-CM | POA: Diagnosis not present

## 2019-12-14 DIAGNOSIS — J449 Chronic obstructive pulmonary disease, unspecified: Secondary | ICD-10-CM | POA: Diagnosis not present

## 2019-12-14 DIAGNOSIS — N184 Chronic kidney disease, stage 4 (severe): Secondary | ICD-10-CM | POA: Diagnosis not present

## 2020-01-11 DIAGNOSIS — I4892 Unspecified atrial flutter: Secondary | ICD-10-CM | POA: Diagnosis not present

## 2020-01-11 DIAGNOSIS — F419 Anxiety disorder, unspecified: Secondary | ICD-10-CM | POA: Diagnosis not present

## 2020-01-11 DIAGNOSIS — I10 Essential (primary) hypertension: Secondary | ICD-10-CM | POA: Diagnosis not present

## 2020-02-11 DIAGNOSIS — I10 Essential (primary) hypertension: Secondary | ICD-10-CM | POA: Diagnosis not present

## 2020-02-11 DIAGNOSIS — N184 Chronic kidney disease, stage 4 (severe): Secondary | ICD-10-CM | POA: Diagnosis not present

## 2020-03-12 DIAGNOSIS — I10 Essential (primary) hypertension: Secondary | ICD-10-CM | POA: Diagnosis not present

## 2020-03-12 DIAGNOSIS — F419 Anxiety disorder, unspecified: Secondary | ICD-10-CM | POA: Diagnosis not present

## 2020-05-12 DIAGNOSIS — M1990 Unspecified osteoarthritis, unspecified site: Secondary | ICD-10-CM | POA: Diagnosis not present

## 2020-05-12 DIAGNOSIS — J449 Chronic obstructive pulmonary disease, unspecified: Secondary | ICD-10-CM | POA: Diagnosis not present

## 2020-06-12 DIAGNOSIS — J449 Chronic obstructive pulmonary disease, unspecified: Secondary | ICD-10-CM | POA: Diagnosis not present

## 2020-06-12 DIAGNOSIS — I10 Essential (primary) hypertension: Secondary | ICD-10-CM | POA: Diagnosis not present

## 2020-07-12 DIAGNOSIS — N184 Chronic kidney disease, stage 4 (severe): Secondary | ICD-10-CM | POA: Diagnosis not present

## 2020-07-12 DIAGNOSIS — I10 Essential (primary) hypertension: Secondary | ICD-10-CM | POA: Diagnosis not present

## 2020-07-12 DIAGNOSIS — J449 Chronic obstructive pulmonary disease, unspecified: Secondary | ICD-10-CM | POA: Diagnosis not present

## 2020-07-12 DIAGNOSIS — Z1331 Encounter for screening for depression: Secondary | ICD-10-CM | POA: Diagnosis not present

## 2020-07-12 DIAGNOSIS — Z0001 Encounter for general adult medical examination with abnormal findings: Secondary | ICD-10-CM | POA: Diagnosis not present

## 2020-07-12 DIAGNOSIS — Z1389 Encounter for screening for other disorder: Secondary | ICD-10-CM | POA: Diagnosis not present

## 2020-08-11 DIAGNOSIS — N184 Chronic kidney disease, stage 4 (severe): Secondary | ICD-10-CM | POA: Diagnosis not present

## 2020-08-11 DIAGNOSIS — I10 Essential (primary) hypertension: Secondary | ICD-10-CM | POA: Diagnosis not present

## 2020-09-11 DIAGNOSIS — I10 Essential (primary) hypertension: Secondary | ICD-10-CM | POA: Diagnosis not present

## 2020-09-11 DIAGNOSIS — N184 Chronic kidney disease, stage 4 (severe): Secondary | ICD-10-CM | POA: Diagnosis not present

## 2020-10-09 DIAGNOSIS — I10 Essential (primary) hypertension: Secondary | ICD-10-CM | POA: Diagnosis not present

## 2020-10-09 DIAGNOSIS — R739 Hyperglycemia, unspecified: Secondary | ICD-10-CM | POA: Diagnosis not present

## 2020-10-09 DIAGNOSIS — N184 Chronic kidney disease, stage 4 (severe): Secondary | ICD-10-CM | POA: Diagnosis not present

## 2020-10-09 DIAGNOSIS — J449 Chronic obstructive pulmonary disease, unspecified: Secondary | ICD-10-CM | POA: Diagnosis not present

## 2020-10-11 DIAGNOSIS — J449 Chronic obstructive pulmonary disease, unspecified: Secondary | ICD-10-CM | POA: Diagnosis not present

## 2020-10-11 DIAGNOSIS — N184 Chronic kidney disease, stage 4 (severe): Secondary | ICD-10-CM | POA: Diagnosis not present

## 2020-10-17 DIAGNOSIS — R062 Wheezing: Secondary | ICD-10-CM | POA: Diagnosis not present

## 2020-10-17 DIAGNOSIS — R0902 Hypoxemia: Secondary | ICD-10-CM | POA: Diagnosis not present

## 2020-10-17 DIAGNOSIS — R000 Tachycardia, unspecified: Secondary | ICD-10-CM | POA: Diagnosis not present

## 2020-10-17 DIAGNOSIS — R0602 Shortness of breath: Secondary | ICD-10-CM | POA: Diagnosis not present

## 2020-10-17 DIAGNOSIS — R0689 Other abnormalities of breathing: Secondary | ICD-10-CM | POA: Diagnosis not present

## 2020-10-18 DIAGNOSIS — W19XXXA Unspecified fall, initial encounter: Secondary | ICD-10-CM | POA: Diagnosis not present

## 2020-10-18 DIAGNOSIS — R062 Wheezing: Secondary | ICD-10-CM | POA: Diagnosis not present

## 2020-10-21 ENCOUNTER — Encounter (HOSPITAL_COMMUNITY): Payer: Self-pay | Admitting: Emergency Medicine

## 2020-10-21 ENCOUNTER — Inpatient Hospital Stay (HOSPITAL_COMMUNITY)
Admission: EM | Admit: 2020-10-21 | Discharge: 2020-11-12 | DRG: 177 | Disposition: A | Discharge status: Skilled Nursing Facility | Payer: Medicare Other | Source: Ambulatory Visit | Attending: Internal Medicine | Admitting: Internal Medicine

## 2020-10-21 ENCOUNTER — Other Ambulatory Visit: Payer: Self-pay

## 2020-10-21 ENCOUNTER — Emergency Department (HOSPITAL_COMMUNITY): Payer: Medicare Other

## 2020-10-21 DIAGNOSIS — Z66 Do not resuscitate: Secondary | ICD-10-CM | POA: Diagnosis not present

## 2020-10-21 DIAGNOSIS — E7220 Disorder of urea cycle metabolism, unspecified: Secondary | ICD-10-CM | POA: Diagnosis not present

## 2020-10-21 DIAGNOSIS — D500 Iron deficiency anemia secondary to blood loss (chronic): Secondary | ICD-10-CM | POA: Diagnosis present

## 2020-10-21 DIAGNOSIS — R1312 Dysphagia, oropharyngeal phase: Secondary | ICD-10-CM | POA: Diagnosis not present

## 2020-10-21 DIAGNOSIS — K922 Gastrointestinal hemorrhage, unspecified: Secondary | ICD-10-CM | POA: Diagnosis not present

## 2020-10-21 DIAGNOSIS — J1282 Pneumonia due to coronavirus disease 2019: Secondary | ICD-10-CM | POA: Diagnosis not present

## 2020-10-21 DIAGNOSIS — D631 Anemia in chronic kidney disease: Secondary | ICD-10-CM | POA: Diagnosis present

## 2020-10-21 DIAGNOSIS — R0602 Shortness of breath: Secondary | ICD-10-CM | POA: Diagnosis not present

## 2020-10-21 DIAGNOSIS — I4891 Unspecified atrial fibrillation: Secondary | ICD-10-CM | POA: Diagnosis not present

## 2020-10-21 DIAGNOSIS — J441 Chronic obstructive pulmonary disease with (acute) exacerbation: Secondary | ICD-10-CM | POA: Diagnosis not present

## 2020-10-21 DIAGNOSIS — J90 Pleural effusion, not elsewhere classified: Secondary | ICD-10-CM | POA: Diagnosis not present

## 2020-10-21 DIAGNOSIS — J449 Chronic obstructive pulmonary disease, unspecified: Secondary | ICD-10-CM | POA: Diagnosis not present

## 2020-10-21 DIAGNOSIS — I130 Hypertensive heart and chronic kidney disease with heart failure and stage 1 through stage 4 chronic kidney disease, or unspecified chronic kidney disease: Secondary | ICD-10-CM | POA: Diagnosis present

## 2020-10-21 DIAGNOSIS — Z7401 Bed confinement status: Secondary | ICD-10-CM | POA: Diagnosis not present

## 2020-10-21 DIAGNOSIS — Z7189 Other specified counseling: Secondary | ICD-10-CM | POA: Diagnosis not present

## 2020-10-21 DIAGNOSIS — K219 Gastro-esophageal reflux disease without esophagitis: Secondary | ICD-10-CM | POA: Diagnosis present

## 2020-10-21 DIAGNOSIS — F061 Catatonic disorder due to known physiological condition: Secondary | ICD-10-CM | POA: Diagnosis not present

## 2020-10-21 DIAGNOSIS — K5900 Constipation, unspecified: Secondary | ICD-10-CM | POA: Diagnosis not present

## 2020-10-21 DIAGNOSIS — E875 Hyperkalemia: Secondary | ICD-10-CM | POA: Diagnosis not present

## 2020-10-21 DIAGNOSIS — J1281 Pneumonia due to SARS-associated coronavirus: Secondary | ICD-10-CM | POA: Diagnosis not present

## 2020-10-21 DIAGNOSIS — J438 Other emphysema: Secondary | ICD-10-CM | POA: Diagnosis not present

## 2020-10-21 DIAGNOSIS — R0689 Other abnormalities of breathing: Secondary | ICD-10-CM

## 2020-10-21 DIAGNOSIS — D649 Anemia, unspecified: Secondary | ICD-10-CM | POA: Diagnosis not present

## 2020-10-21 DIAGNOSIS — Z87891 Personal history of nicotine dependence: Secondary | ICD-10-CM

## 2020-10-21 DIAGNOSIS — R5381 Other malaise: Secondary | ICD-10-CM | POA: Diagnosis not present

## 2020-10-21 DIAGNOSIS — E860 Dehydration: Secondary | ICD-10-CM | POA: Diagnosis present

## 2020-10-21 DIAGNOSIS — R0902 Hypoxemia: Secondary | ICD-10-CM | POA: Diagnosis present

## 2020-10-21 DIAGNOSIS — E870 Hyperosmolality and hypernatremia: Secondary | ICD-10-CM | POA: Diagnosis not present

## 2020-10-21 DIAGNOSIS — U071 COVID-19: Secondary | ICD-10-CM | POA: Diagnosis not present

## 2020-10-21 DIAGNOSIS — R7401 Elevation of levels of liver transaminase levels: Secondary | ICD-10-CM | POA: Diagnosis not present

## 2020-10-21 DIAGNOSIS — K5909 Other constipation: Secondary | ICD-10-CM | POA: Diagnosis present

## 2020-10-21 DIAGNOSIS — Z0189 Encounter for other specified special examinations: Secondary | ICD-10-CM

## 2020-10-21 DIAGNOSIS — J9811 Atelectasis: Secondary | ICD-10-CM | POA: Diagnosis not present

## 2020-10-21 DIAGNOSIS — J811 Chronic pulmonary edema: Secondary | ICD-10-CM | POA: Diagnosis not present

## 2020-10-21 DIAGNOSIS — R410 Disorientation, unspecified: Secondary | ICD-10-CM | POA: Diagnosis not present

## 2020-10-21 DIAGNOSIS — E440 Moderate protein-calorie malnutrition: Secondary | ICD-10-CM | POA: Diagnosis not present

## 2020-10-21 DIAGNOSIS — G9341 Metabolic encephalopathy: Secondary | ICD-10-CM | POA: Diagnosis not present

## 2020-10-21 DIAGNOSIS — M6281 Muscle weakness (generalized): Secondary | ICD-10-CM | POA: Diagnosis not present

## 2020-10-21 DIAGNOSIS — R0600 Dyspnea, unspecified: Secondary | ICD-10-CM

## 2020-10-21 DIAGNOSIS — Z781 Physical restraint status: Secondary | ICD-10-CM

## 2020-10-21 DIAGNOSIS — K921 Melena: Secondary | ICD-10-CM | POA: Diagnosis not present

## 2020-10-21 DIAGNOSIS — I4892 Unspecified atrial flutter: Secondary | ICD-10-CM | POA: Diagnosis not present

## 2020-10-21 DIAGNOSIS — Z7982 Long term (current) use of aspirin: Secondary | ICD-10-CM | POA: Diagnosis not present

## 2020-10-21 DIAGNOSIS — E0781 Sick-euthyroid syndrome: Secondary | ICD-10-CM | POA: Diagnosis present

## 2020-10-21 DIAGNOSIS — Z8616 Personal history of COVID-19: Secondary | ICD-10-CM | POA: Diagnosis not present

## 2020-10-21 DIAGNOSIS — W19XXXD Unspecified fall, subsequent encounter: Secondary | ICD-10-CM | POA: Diagnosis not present

## 2020-10-21 DIAGNOSIS — Z7952 Long term (current) use of systemic steroids: Secondary | ICD-10-CM

## 2020-10-21 DIAGNOSIS — R262 Difficulty in walking, not elsewhere classified: Secondary | ICD-10-CM | POA: Diagnosis not present

## 2020-10-21 DIAGNOSIS — I10 Essential (primary) hypertension: Secondary | ICD-10-CM | POA: Diagnosis present

## 2020-10-21 DIAGNOSIS — J439 Emphysema, unspecified: Secondary | ICD-10-CM | POA: Diagnosis present

## 2020-10-21 DIAGNOSIS — Z79899 Other long term (current) drug therapy: Secondary | ICD-10-CM | POA: Diagnosis not present

## 2020-10-21 DIAGNOSIS — I5031 Acute diastolic (congestive) heart failure: Secondary | ICD-10-CM | POA: Diagnosis not present

## 2020-10-21 DIAGNOSIS — I959 Hypotension, unspecified: Secondary | ICD-10-CM | POA: Diagnosis present

## 2020-10-21 DIAGNOSIS — J9621 Acute and chronic respiratory failure with hypoxia: Secondary | ICD-10-CM

## 2020-10-21 DIAGNOSIS — R0603 Acute respiratory distress: Secondary | ICD-10-CM

## 2020-10-21 DIAGNOSIS — N184 Chronic kidney disease, stage 4 (severe): Secondary | ICD-10-CM | POA: Diagnosis present

## 2020-10-21 DIAGNOSIS — I480 Paroxysmal atrial fibrillation: Secondary | ICD-10-CM | POA: Diagnosis present

## 2020-10-21 DIAGNOSIS — Z515 Encounter for palliative care: Secondary | ICD-10-CM | POA: Diagnosis not present

## 2020-10-21 DIAGNOSIS — J9601 Acute respiratory failure with hypoxia: Secondary | ICD-10-CM | POA: Diagnosis not present

## 2020-10-21 DIAGNOSIS — I517 Cardiomegaly: Secondary | ICD-10-CM | POA: Diagnosis not present

## 2020-10-21 DIAGNOSIS — R41841 Cognitive communication deficit: Secondary | ICD-10-CM | POA: Diagnosis not present

## 2020-10-21 DIAGNOSIS — R4182 Altered mental status, unspecified: Secondary | ICD-10-CM | POA: Diagnosis not present

## 2020-10-21 LAB — PROCALCITONIN: Procalcitonin: 0.25 ng/mL

## 2020-10-21 LAB — TRIGLYCERIDES: Triglycerides: 163 mg/dL — ABNORMAL HIGH (ref <150)

## 2020-10-21 LAB — CBC
HCT: 38.8 % — ABNORMAL LOW (ref 39.0–52.0)
HCT: 37.7 % — ABNORMAL LOW (ref 39.0–52.0)
Hemoglobin: 12.2 g/dL — ABNORMAL LOW (ref 13.0–17.0)
Hemoglobin: 11.7 g/dL — ABNORMAL LOW (ref 13.0–17.0)
MCH: 30.3 pg (ref 26.0–34.0)
MCH: 29.8 pg (ref 26.0–34.0)
MCHC: 31.4 g/dL (ref 30.0–36.0)
MCHC: 31 g/dL (ref 30.0–36.0)
MCV: 96.5 fL (ref 80.0–100.0)
MCV: 95.9 fL (ref 80.0–100.0)
Platelets: 247 10*3/uL (ref 150–400)
Platelets: 243 10*3/uL (ref 150–400)
RBC: 4.02 MIL/uL — ABNORMAL LOW (ref 4.22–5.81)
RBC: 3.93 MIL/uL — ABNORMAL LOW (ref 4.22–5.81)
RDW: 13.6 % (ref 11.5–15.5)
RDW: 13.4 % (ref 11.5–15.5)
WBC: 8.9 10*3/uL (ref 4.0–10.5)
WBC: 7.4 10*3/uL (ref 4.0–10.5)
nRBC: 0 % (ref 0.0–0.2)
nRBC: 0 % (ref 0.0–0.2)

## 2020-10-21 LAB — BASIC METABOLIC PANEL
Anion gap: 12 (ref 5–15)
BUN: 68 mg/dL — ABNORMAL HIGH (ref 8–23)
CO2: 24 mmol/L (ref 22–32)
Calcium: 8.5 mg/dL — ABNORMAL LOW (ref 8.9–10.3)
Chloride: 104 mmol/L (ref 98–111)
Creatinine, Ser: 2.62 mg/dL — ABNORMAL HIGH (ref 0.61–1.24)
GFR, Estimated: 25 mL/min — ABNORMAL LOW (ref >60)
Glucose, Bld: 121 mg/dL — ABNORMAL HIGH (ref 70–99)
Potassium: 4.5 mmol/L (ref 3.5–5.1)
Sodium: 140 mmol/L (ref 135–145)

## 2020-10-21 LAB — D-DIMER, QUANTITATIVE: D-Dimer, Quant: 1.29 ug/mL-FEU — ABNORMAL HIGH (ref 0.00–0.50)

## 2020-10-21 LAB — RESP PANEL BY RT-PCR (FLU A&B, COVID) ARPGX2
Influenza A by PCR: NEGATIVE
Influenza B by PCR: NEGATIVE
SARS Coronavirus 2 by RT PCR: POSITIVE — AB

## 2020-10-21 LAB — HEPATIC FUNCTION PANEL
ALT: 69 U/L — ABNORMAL HIGH (ref 0–44)
AST: 126 U/L — ABNORMAL HIGH (ref 15–41)
Albumin: 3 g/dL — ABNORMAL LOW (ref 3.5–5.0)
Alkaline Phosphatase: 44 U/L (ref 38–126)
Bilirubin, Direct: 0.4 mg/dL — ABNORMAL HIGH (ref 0.0–0.2)
Indirect Bilirubin: 0.4 mg/dL (ref 0.3–0.9)
Total Bilirubin: 0.8 mg/dL (ref 0.3–1.2)
Total Protein: 7.6 g/dL (ref 6.5–8.1)

## 2020-10-21 LAB — LACTIC ACID, PLASMA: Lactic Acid, Venous: 1.8 mmol/L (ref 0.5–1.9)

## 2020-10-21 LAB — C-REACTIVE PROTEIN: CRP: 14.4 mg/dL — ABNORMAL HIGH (ref <1.0)

## 2020-10-21 LAB — CREATININE, SERUM
Creatinine, Ser: 2.49 mg/dL — ABNORMAL HIGH (ref 0.61–1.24)
GFR, Estimated: 27 mL/min — ABNORMAL LOW (ref >60)

## 2020-10-21 LAB — FIBRINOGEN: Fibrinogen: 608 mg/dL — ABNORMAL HIGH (ref 210–475)

## 2020-10-21 LAB — FERRITIN: Ferritin: 543 ng/mL — ABNORMAL HIGH (ref 24–336)

## 2020-10-21 LAB — LACTATE DEHYDROGENASE: LDH: 375 U/L — ABNORMAL HIGH (ref 98–192)

## 2020-10-21 MED ORDER — GUAIFENESIN-DM 100-10 MG/5ML PO SYRP
10.0000 mL | ORAL_SOLUTION | ORAL | Status: DC | PRN
  Administered 2020-10-22: 23:00:00 10 mL via ORAL
  Filled 2020-10-21: qty 10

## 2020-10-21 MED ORDER — OXYCODONE HCL 5 MG PO TABS
5.0000 mg | ORAL_TABLET | Freq: Four times a day (QID) | ORAL | Status: DC | PRN
  Administered 2020-10-23 – 2020-10-26 (×2): 5 mg via ORAL
  Filled 2020-10-21 (×3): qty 1

## 2020-10-21 MED ORDER — HYDROCOD POLST-CPM POLST ER 10-8 MG/5ML PO SUER: 5.0000 mL | Freq: Two times a day (BID) | ORAL | Status: DC | PRN

## 2020-10-21 MED ORDER — ENOXAPARIN SODIUM 30 MG/0.3ML ~~LOC~~ SOLN
30.0000 mg | SUBCUTANEOUS | Status: DC
  Administered 2020-10-21: 16:00:00 30 mg via SUBCUTANEOUS
  Filled 2020-10-21: qty 0.3

## 2020-10-21 MED ORDER — ALBUTEROL SULFATE HFA 108 (90 BASE) MCG/ACT IN AERS
2.0000 | INHALATION_SPRAY | Freq: Four times a day (QID) | RESPIRATORY_TRACT | Status: DC | PRN
  Administered 2020-10-24: 2 via RESPIRATORY_TRACT

## 2020-10-21 MED ORDER — TRAZODONE HCL 50 MG PO TABS
25.0000 mg | ORAL_TABLET | Freq: Every evening | ORAL | Status: DC | PRN
  Administered 2020-10-22 – 2020-10-25 (×4): 25 mg via ORAL
  Filled 2020-10-21 (×4): qty 1

## 2020-10-21 MED ORDER — ONDANSETRON HCL 4 MG PO TABS: 4.0000 mg | ORAL_TABLET | Freq: Four times a day (QID) | ORAL | Status: DC | PRN

## 2020-10-21 MED ORDER — ACETAMINOPHEN 325 MG PO TABS: 650.0000 mg | ORAL_TABLET | Freq: Four times a day (QID) | ORAL | Status: DC | PRN

## 2020-10-21 MED ORDER — SODIUM CHLORIDE 0.9 % IV SOLN: INTRAVENOUS | Status: DC

## 2020-10-21 MED ORDER — ONDANSETRON HCL 4 MG/2ML IJ SOLN: 4.0000 mg | Freq: Four times a day (QID) | INTRAMUSCULAR | Status: DC | PRN

## 2020-10-21 MED ORDER — LACTULOSE 10 GM/15ML PO SOLN: 20.0000 g | Freq: Two times a day (BID) | ORAL | Status: DC | PRN

## 2020-10-21 MED ORDER — SODIUM CHLORIDE 0.9 % IV SOLN: 100.0000 mg | INTRAVENOUS | Status: DC

## 2020-10-21 MED ORDER — ZINC SULFATE 220 (50 ZN) MG PO CAPS
220.0000 mg | ORAL_CAPSULE | Freq: Every day | ORAL | Status: DC
  Administered 2020-10-21 – 2020-11-12 (×21): 220 mg via ORAL
  Filled 2020-10-21 (×24): qty 1

## 2020-10-21 MED ORDER — ASPIRIN EC 81 MG PO TBEC
81.0000 mg | DELAYED_RELEASE_TABLET | Freq: Every day | ORAL | Status: DC
  Administered 2020-10-22 – 2020-10-31 (×8): 81 mg via ORAL
  Filled 2020-10-21 (×10): qty 1

## 2020-10-21 MED ORDER — ALBUTEROL SULFATE HFA 108 (90 BASE) MCG/ACT IN AERS
4.0000 | INHALATION_SPRAY | Freq: Once | RESPIRATORY_TRACT | Status: AC
  Administered 2020-10-21: 13:00:00 6 via RESPIRATORY_TRACT
  Filled 2020-10-21: qty 6.7

## 2020-10-21 MED ORDER — METHYLPREDNISOLONE SODIUM SUCC 125 MG IJ SOLR
125.0000 mg | Freq: Once | INTRAMUSCULAR | Status: AC
  Administered 2020-10-21: 13:00:00 125 mg via INTRAVENOUS
  Filled 2020-10-21: qty 2

## 2020-10-21 MED ORDER — PREDNISONE 20 MG PO TABS: 50.0000 mg | ORAL_TABLET | Freq: Every day | ORAL | Status: DC

## 2020-10-21 MED ORDER — ASCORBIC ACID 500 MG PO TABS
500.0000 mg | ORAL_TABLET | Freq: Every day | ORAL | Status: DC
  Administered 2020-10-21 – 2020-11-12 (×21): 500 mg via ORAL
  Filled 2020-10-21 (×24): qty 1

## 2020-10-21 MED ORDER — METHYLPREDNISOLONE SODIUM SUCC 125 MG IJ SOLR
0.5000 mg/kg | Freq: Two times a day (BID) | INTRAMUSCULAR | Status: AC
  Administered 2020-10-21 – 2020-10-24 (×6): 50.625 mg via INTRAVENOUS
  Filled 2020-10-21 (×6): qty 2

## 2020-10-21 MED ORDER — IPRATROPIUM-ALBUTEROL 20-100 MCG/ACT IN AERS
1.0000 | INHALATION_SPRAY | Freq: Four times a day (QID) | RESPIRATORY_TRACT | Status: DC
  Administered 2020-10-21 – 2020-10-24 (×10): 1 via RESPIRATORY_TRACT
  Filled 2020-10-21: qty 4

## 2020-10-21 MED ORDER — SODIUM CHLORIDE 0.9 % IV SOLN: 100.0000 mg | Freq: Every day | INTRAVENOUS | Status: DC

## 2020-10-21 MED ORDER — PANTOPRAZOLE SODIUM 40 MG IV SOLR
40.0000 mg | Freq: Every day | INTRAVENOUS | Status: DC
  Administered 2020-10-21 – 2020-10-22 (×2): 40 mg via INTRAVENOUS
  Filled 2020-10-21 (×2): qty 40

## 2020-10-21 MED ORDER — CARVEDILOL 3.125 MG PO TABS
3.1250 mg | ORAL_TABLET | Freq: Two times a day (BID) | ORAL | Status: DC
  Administered 2020-10-21 – 2020-10-26 (×10): 3.125 mg via ORAL
  Filled 2020-10-21 (×13): qty 1

## 2020-10-21 NOTE — H&P (Signed)
History and Physical  [place] FBP:[id number] DOB: August 16, 1946 DOA: 10/21/2020  PCP: Rosita Fire, MD  Patient coming from: PCP office  I have personally briefly reviewed patient's old medical records in [place]  Chief Complaint: Shortness of breath  HPI: Darren Burke is a 74 y.o. male with medical history significant for longtime tobacco use, COPD (emphysema), GERD, stage IV CKD, chronic constipation, hypertension, paroxysmal atrial flutter who presented to the emergency department from PCP office with shortness of breath and hypoxia.  He was noted to have a pulse ox of 80 to 81% on room air.  He has been placed on 4 L nasal cannula with improvement of oxygen saturation to 87%.  He does not wear home oxygen.  He has had progressive shortness of breath for the past 2 days.  He feels unwell overall.  He says that he ran out of his albuterol inhaler 4 days ago.  His symptoms have been persistent and progressively getting worse.  His shortness of breath is much worse when ambulating or moving.  He denies chest pain.  He reports a nonproductive cough.  He lives home alone.  He is unvaccinated for COVID-19.  ED Course: Patient arrives hypoxic and tachypneic was placed on 4 L nasal cannula with improvement in oxygen saturation to 90%.  His respiratory rate was 26.  His heart rate was 85.  He was afebrile with a temperature of 98.4.  His SARS 2 coronavirus test was positive.  His influenza A/B testing was negative.  He had a lactic acid of 1.8 and a procalcitonin of 0.25.  His LDH was 375.  His triglycerides was 163.  His ALT was 69 and AST 126.  His albumin was 3.0.  His creatinine 2.62.  He was wheezing diffusely and treated with steroids and nebulizers.  He was initially started on appropriate Covid treatment protocol however his daughter and patient have refused to take remdesivir and other treatments until they "do their own research" and get back to [place] tomorrow.   They are agreeable with taking steroids.  Patient is being admitted to receive further therapies and supportive care as he allows.  Review of Systems: Review of Systems  Constitutional: Positive for chills, fever and malaise/fatigue. Negative for diaphoresis.  HENT: Negative for ear discharge, ear pain, hearing loss, nosebleeds and tinnitus.   Respiratory: Positive for cough.   Cardiovascular: Negative for chest pain, palpitations, orthopnea, claudication, leg swelling and PND.  Gastrointestinal: Positive for constipation and nausea. Negative for abdominal pain, blood in stool, melena and vomiting.  Genitourinary: Positive for frequency. Negative for dysuria, flank pain, hematuria and urgency.  Musculoskeletal: Negative.   Skin: Negative for itching and rash.  Neurological: Positive for dizziness. Negative for sensory change, speech change, focal weakness, seizures, loss of consciousness, weakness and headaches.  Endo/Heme/Allergies: Negative for environmental allergies and polydipsia. Does not bruise/bleed easily.  Psychiatric/Behavioral: Negative.   All other systems reviewed and are negative.   Past Medical History:  Diagnosis Date  . COPD (chronic obstructive pulmonary disease) (HCC)    Emphysema radiographically  . GERD (gastroesophageal reflux disease)   . Hypertension   . Paroxysmal atrial flutter [facility])     Past Surgical History:  Procedure Laterality Date  . ARTERY REPAIR     sinus     reports that he has quit smoking. His smoking use included cigarettes. He has a 120.00 pack-year smoking history. He has never used smokeless tobacco. He reports that  he does not drink alcohol and does not use drugs.  Allergies  Allergen Reactions  . Penicillins Hives and Itching    Has patient had a PCN reaction causing immediate rash, facial/tongue/throat swelling, SOB or lightheadedness with hypotension: no Has patient had a PCN reaction causing severe rash involving mucus membranes or  skin necrosis: no Has patient had a PCN reaction that required hospitalization: no Has patient had a PCN reaction occurring within the last 10 years: no If all of the above answers are "NO", then may proceed with Cephalosporin use.    Family History  Problem Relation Age of Onset  . Heart failure Mother   . Stroke Father      Prior to Admission medications   Medication Sig Start Date End Date Taking? Authorizing Provider  albuterol (PROVENTIL HFA;VENTOLIN HFA) 108 (90 Base) MCG/ACT inhaler Inhale 2 puffs into the lungs every 6 (six) hours as needed for wheezing or shortness of breath. 11/27/17   Orson Eva, MD  aspirin EC 81 MG EC tablet Take 1 tablet (81 mg total) by mouth daily. 12/12/16   Florencia Reasons, MD  azithromycin (ZITHROMAX) 500 MG tablet Take 1 tablet (500 mg total) by mouth daily. 11/27/17   Orson Eva, MD  carvedilol (COREG) 3.125 MG tablet Take 1 tablet (3.125 mg total) by mouth 2 (two) times daily with a meal. 12/14/16   Sinda Du, MD  lactulose (CHRONULAC) 10 GM/15ML solution Take 30 mLs (20 g total) by mouth 2 (two) times daily as needed for moderate constipation. 02/03/18   Orpah Greek, MD  polyethylene glycol (MIRALAX / GLYCOLAX) packet Take 17 g by mouth daily. 11/28/17   Orson Eva, MD  predniSONE (DELTASONE) 10 MG tablet Take 6 tablets (60 mg total) by mouth daily with breakfast. And decrease by one tablet daily 11/28/17   Tat, Shanon Brow, MD  senna (SENOKOT) 8.6 MG TABS tablet Take 2 tablets (17.2 mg total) by mouth daily. 11/28/17   Orson Eva, MD  umeclidinium bromide (INCRUSE ELLIPTA) 62.5 MCG/INH AEPB Inhale 1 puff into the lungs daily. 12/14/16   Sinda Du, MD    Physical Exam: Vitals:   10/21/20 1400 10/21/20 1415 10/21/20 1418 10/21/20 1430  BP: 134/75 (!) 158/79 (!) 144/74 130/74  Pulse: 82 89 87 86  Resp: 20 (!) 23 (!) 25 (!) 24  Temp:   98.4 F (36.9 C)   TempSrc:   Oral   SpO2: 93%  91% 90%  Weight:      Height:        Constitutional:  Chronically ill appearing male in moderate distress tachypneic, pale and quite dyspneic.  He is unable to lie recumbent on exam gurney. Eyes: PERRL, lids and conjunctivae normal ENMT: Mucous membranes are dry. Posterior pharynx clear of any exudate or lesions.  Neck: normal, supple, no masses, no thyromegaly Respiratory: Diffuse inspiratory and expiratory wheezes heard, poor air movement bilaterally with Rales heard in the right lower lobe, moderate increased work of breathing. Cardiovascular: Normal S1-S2 sounds, no murmurs / rubs / gallops. No extremity edema. 2+ pedal pulses. No carotid bruits.  Abdomen: no tenderness, no masses palpated. No hepatosplenomegaly. Bowel sounds positive.  Musculoskeletal: no clubbing / cyanosis. No joint deformity upper and lower extremities. Good ROM, no contractures. Normal muscle tone.  Skin: no rashes, lesions, ulcers. No induration Neurologic: CN 2-12 grossly intact. Sensation intact, DTR normal. Strength 5/5 in all 4.  Psychiatric: poor judgment and insight. Alert and oriented x 3. Normal mood.   Labs  on Admission: I have personally reviewed following labs and imaging studies  CBC: Recent Labs  Lab 10/21/20 1202  WBC 8.9  HGB 12.2*  HCT 38.8*  MCV 96.5  PLT 932   Basic Metabolic Panel: Recent Labs  Lab 10/21/20 1202  NA 140  K 4.5  CL 104  CO2 24  GLUCOSE 121*  BUN 68*  CREATININE 2.62*  CALCIUM 8.5*   GFR: Estimated Creatinine Clearance: 28.9 mL/min (A) (by C-G formula based on SCr of 2.62 mg/dL (H)). Liver Function Tests: Recent Labs  Lab 10/21/20 1424  AST 126*  ALT 69*  ALKPHOS 44  BILITOT 0.8  PROT 7.6  ALBUMIN 3.0*   No results for input(s): LIPASE, AMYLASE in the last 168 hours. No results for input(s): AMMONIA in the last 168 hours. Coagulation Profile: No results for input(s): INR, PROTIME in the last 168 hours. Cardiac Enzymes: No results for input(s): CKTOTAL, CKMB, CKMBINDEX, TROPONINI in the last 168  hours. BNP (last 3 results) No results for input(s): PROBNP in the last 8760 hours. HbA1C: No results for input(s): HGBA1C in the last 72 hours. CBG: No results for input(s): GLUCAP in the last 168 hours. Lipid Profile: No results for input(s): CHOL, HDL, LDLCALC, TRIG, CHOLHDL, LDLDIRECT in the last 72 hours. Thyroid Function Tests: No results for input(s): TSH, T4TOTAL, FREET4, T3FREE, THYROIDAB in the last 72 hours. Anemia Panel: No results for input(s): VITAMINB12, FOLATE, FERRITIN, TIBC, IRON, RETICCTPCT in the last 72 hours. Urine analysis:    Component Value Date/Time   COLORURINE YELLOW 02/03/2018 [place] 02/03/2018 0432   LABSPEC 1.013 02/03/2018 0432   PHURINE 5.0 02/03/2018 0432   GLUCOSEU NEGATIVE 02/03/2018 0432   HGBUR MODERATE (A) 02/03/2018 0432   BILIRUBINUR NEGATIVE 02/03/2018 0432   KETONESUR NEGATIVE 02/03/2018 0432   PROTEINUR 30 (A) 02/03/2018 0432   UROBILINOGEN 0.2 11/10/2014 0400   NITRITE NEGATIVE 02/03/2018 0432   LEUKOCYTESUR SMALL (A) 02/03/2018 0432    Radiological Exams on Admission: DG Chest Portable 1 View  Result Date: 10/21/2020 CLINICAL DATA:  Hypoxia. EXAM: PORTABLE CHEST 1 VIEW COMPARISON:  November 24, 2017. FINDINGS: The heart size and mediastinal contours are within normal limits. No pneumothorax or pleural effusion is noted. Left lung is clear. Right midlung and basilar opacities are noted concerning for pneumonia. The visualized skeletal structures are unremarkable. IMPRESSION: Right midlung and basilar opacities are noted concerning for pneumonia. Electronically Signed   By: Marijo Conception M.D.   On: 10/21/2020 12:59   EKG: Independently reviewed. NSR  Assessment/Plan Principal Problem:   Pneumonia due to COVID-19 virus Active Problems:   Pulmonary emphysema (HCC)   COPD with acute exacerbation (HCC)   SOB (shortness of breath)   Hypoxia   Acute and chronic respiratory failure with hypoxia (HCC)   HTN  (hypertension)   Paroxysmal atrial flutter (HCC)   Anemia   CKD (chronic kidney disease), stage IV (HCC)   Transaminitis   1. Acute respiratory failure with hypoxia secondary to Covid pneumonia-patient is unvaccinated.  He has underlying chronic lung disease and other co-morbidities.  He remains High Risk for adverse outcome and severe lung parenchymal injury.  He and family refusing remdesivir at this time which helps to slow down covid virus replication.  He was counseled by me at the bedside regarding the risks and benefits regarding remdesivir and baricitinib including worsening disease and death and verbalized understanding.  He said that he would "do his own research" and let the  medical team know tomorrow if he would like to start remdesivir or other treatments.  He is agreeable to steroids at this time.  Solu-Medrol ordered IV twice daily.  Bronchodilator therapy with Combivent ordered.  Follow inflammatory markers daily.  Enoxaparin ordered for DVT prophylaxis. 2. Stage IV CKD -his renal function is stable from recent testing and we will be following it daily. 3. COPD with acute exacerbation secondary to Covid infection treating with IV steroids and bronchodilators as above. 4. Anemia in CKD-follow CBC closely currently stable from recent testing. 5. Paroxysmal atrial flutter-currently in sinus rhythm.  6. Essential hypertension - resume home carvedilol. Following.  7. Chronic constipation - laxative therapy ordered.  8. Transaminitis - likely reactive from current Covid infection.  Following.   DVT prophylaxis: enoxaparin   Code Status: full   Family Communication: plan of care discussed with patient/daughter, counseling on remdesivir and baricitinib completed 12/27 and family will let us know 12/28 about their decision  Disposition Plan: home   Consults called: n/a   Admission status: INP   Matisha Termine MD [facility] How to contact the [facility] Attending or Consulting  provider [place] or covering provider during after hours [place], for this patient?  1. Check the care team in [facility] and look for a) attending/consulting [facility] provider listed and b) the [facility] team listed 2. Log into [email or web address] and use [place]'s universal password to access. If you do not have the password, please contact the hospital operator. 3. Locate the [facility] provider you are looking for under [facility] and page to a number that you can be directly reached. 4. If you still have difficulty reaching the provider, please page the [facility] (Director on Call) for the Hospitalists listed on amion for assistance.   If 7PM-7AM, please contact night-coverage [email or web address] Password [facility]  10/21/2020, 3:15 PM

## 2020-10-21 NOTE — ED Notes (Signed)
Pt in bed, helped pt up to urinate, pt got more sob with exertion.  Pt states that he doesn't wan remdesivir at this time, family called and said that they had done their own research and he didn't want the medication, explained pros and cons with pt, pt states that he will talk things over with his family.

## 2020-10-21 NOTE — ED Provider Notes (Addendum)
[facility] EMERGENCY DEPARTMENT Provider Note   CSN: [id number] Arrival date & time: 10/21/20  1122     History Chief Complaint  Patient presents with  . Shortness of Breath    Darren Burke is a 74 y.o. male with past medical history significant for COPD, GERD, hypertension, proximal atrial flutter, pulmonary emphysema, CKD stage IV. Not anticoagulated. Did not have covid vaccinations.   HPI Presents to emergency room today from PCP office this morning for shortness of breath.  He was found to have oxygen saturation of 80% on room air at PCP office.  He does not wear home oxygen.  He was placed on 4 L nasal cannula and oxygen saturation increased to 87%. Patient states he has been feeling short of breath x2 days and symptoms have been progressively worsening. He admits to just feeling poorly overall. He ran out of his inhaler x4 days ago. Symptoms are constant, no modifying or radiating factors. Tolerating PO intake without difficulty. Patient states he lives alone and family checks on him frequently.  No over-the-counter medications for symptoms prior to arrival.  He denies any sick contacts or known Covid exposures.  Denies fever, chills, cough, hemoptysis, chest pain, palpitations, lower extremity edema.   Past Medical History:  Diagnosis Date  . COPD (chronic obstructive pulmonary disease) (HCC)    Emphysema radiographically  . GERD (gastroesophageal reflux disease)   . Hypertension   . Paroxysmal atrial flutter [facility])     Patient Active Problem List   Diagnosis Date Noted  . Pneumonia due to COVID-19 virus 10/21/2020  . CKD (chronic kidney disease), stage IV ([place]) 11/25/2017  . CKD (chronic kidney disease), stage III ([place]) 11/24/2017  . HTN (hypertension) 12/10/2016  . AKI (acute kidney injury) ([place]) 12/10/2016  . Paroxysmal atrial flutter ([place]) 12/10/2016  . Anemia 12/10/2016  . Acute bacterial bronchitis 12/10/2016  . Pneumonia 11/10/2014  . Hypoxia   . Acute on  chronic respiratory failure with hypoxia ([place])   . COPD with acute exacerbation ([place]) 10/05/2013  . PNA (pneumonia) 10/05/2013  . Acute respiratory failure with hypoxia ([place]) 10/05/2013  . Sinus tachycardia 10/05/2013  . SOB (shortness of breath) 10/05/2013  . CAP (community acquired pneumonia) 10/05/2013  . Hyperglycemia, drug-induced 10/05/2013  . Pulmonary emphysema ([place])     Past Surgical History:  Procedure Laterality Date  . ARTERY REPAIR     sinus       Family History  Problem Relation Age of Onset  . Heart failure Mother   . Stroke Father     Social History   Tobacco Use  . Smoking status: Former Smoker    Packs/day: 3.00    Years: 40.00    Pack years: 120.00    Types: Cigarettes  . Smokeless tobacco: Never Used  Vaping Use  . Vaping Use: Never used  Substance Use Topics  . Alcohol use: No  . Drug use: No    Home Medications Prior to Admission medications   Medication Sig Start Date End Date Taking? Authorizing Provider  albuterol (PROVENTIL HFA;VENTOLIN HFA) 108 (90 Base) MCG/ACT inhaler Inhale 2 puffs into the lungs every 6 (six) hours as needed for wheezing or shortness of breath. 11/27/17   Orson Eva, MD  aspirin EC 81 MG EC tablet Take 1 tablet (81 mg total) by mouth daily. 12/12/16   Florencia Reasons, MD  azithromycin (ZITHROMAX) 500 MG tablet Take 1 tablet (500 mg total) by mouth daily. 11/27/17   Orson Eva, MD  carvedilol (  COREG) 3.125 MG tablet Take 1 tablet (3.125 mg total) by mouth 2 (two) times daily with a meal. 12/14/16   Sinda Du, MD  lactulose (CHRONULAC) 10 GM/15ML solution Take 30 mLs (20 g total) by mouth 2 (two) times daily as needed for moderate constipation. 02/03/18   Orpah Greek, MD  polyethylene glycol (MIRALAX / GLYCOLAX) packet Take 17 g by mouth daily. 11/28/17   Orson Eva, MD  predniSONE (DELTASONE) 10 MG tablet Take 6 tablets (60 mg total) by mouth daily with breakfast. And decrease by one tablet daily 11/28/17   Tat, Shanon Brow, MD   senna (SENOKOT) 8.6 MG TABS tablet Take 2 tablets (17.2 mg total) by mouth daily. 11/28/17   Orson Eva, MD  umeclidinium bromide (INCRUSE ELLIPTA) 62.5 MCG/INH AEPB Inhale 1 puff into the lungs daily. 12/14/16   Sinda Du, MD    Allergies    Penicillins  Review of Systems   Review of Systems All other systems are reviewed and are negative for acute change except as noted in the HPI.  Physical Exam Updated Vital Signs BP 124/66   Pulse 80   Temp 98.1 F (36.7 C) (Temporal)   Resp (!) 22   Ht 5\' 7"  (1.702 m)   Wt 101.2 kg   SpO2 91%   BMI 34.93 kg/m   Physical Exam Vitals and nursing note reviewed.  Constitutional:      General: He is not in acute distress.    Appearance: He is not ill-appearing.  HENT:     Head: Normocephalic and atraumatic.     Right Ear: Tympanic membrane and external ear normal.     Left Ear: Tympanic membrane and external ear normal.     Nose: Nose normal.     Mouth/Throat:     Mouth: Mucous membranes are moist.     Pharynx: Oropharynx is clear.  Eyes:     General: No scleral icterus.       Right eye: No discharge.        Left eye: No discharge.     Extraocular Movements: Extraocular movements intact.     Conjunctiva/sclera: Conjunctivae normal.     Pupils: Pupils are equal, round, and reactive to light.  Neck:     Vascular: No JVD.  Cardiovascular:     Rate and Rhythm: Normal rate and regular rhythm.     Pulses: Normal pulses.          Radial pulses are 2+ on the right side and 2+ on the left side.     Heart sounds: Normal heart sounds.  Pulmonary:     Comments: Tachypneic. Wheezing heard in all lung fields.  No rales or rhonchi.  Symmetric chest rise.  Speaking in short sentences.  Has accessory muscle usage.  Oxygen saturation is 92% on 4 L. Not tripoding Abdominal:     Comments: Abdomen is soft, non-distended, and non-tender in all quadrants. No rigidity, no guarding. No peritoneal signs.  Musculoskeletal:        General: Normal  range of motion.     Cervical back: Normal range of motion.     Right lower leg: No edema.     Left lower leg: No edema.  Skin:    General: Skin is warm and dry.     Capillary Refill: Capillary refill takes less than 2 seconds.     Comments: Equal tactile temperature in all extremities  Neurological:     Mental Status: He is oriented to person, place,  and time.     GCS: GCS eye subscore is 4. GCS verbal subscore is 5. GCS motor subscore is 6.     Comments: Fluent speech, no facial droop.  Psychiatric:        Behavior: Behavior normal.     ED Results / Procedures / Treatments   Labs (all labs ordered are listed, but only abnormal results are displayed) Labs Reviewed  RESP PANEL BY RT-PCR (FLU A&B, COVID) ARPGX2 - Abnormal; Notable for the following components:      Result Value   SARS Coronavirus 2 by RT PCR POSITIVE (*)    All other components within normal limits  BASIC METABOLIC PANEL - Abnormal; Notable for the following components:   Glucose, Bld 121 (*)    BUN 68 (*)    Creatinine, Ser 2.62 (*)    Calcium 8.5 (*)    GFR, Estimated 25 (*)    All other components within normal limits  CBC - Abnormal; Notable for the following components:   RBC 4.02 (*)    Hemoglobin 12.2 (*)    HCT 38.8 (*)    All other components within normal limits  CULTURE, BLOOD (ROUTINE X 2)  CULTURE, BLOOD (ROUTINE X 2)  LACTIC ACID, PLASMA  LACTIC ACID, PLASMA  D-DIMER, QUANTITATIVE (NOT AT [facility])  PROCALCITONIN  LACTATE DEHYDROGENASE  FERRITIN  TRIGLYCERIDES  FIBRINOGEN  C-REACTIVE PROTEIN  HEPATIC FUNCTION PANEL    EKG EKG Interpretation  Date/Time:  Monday October 21 2020 11:59:12 EST Ventricular Rate:  110 PR Interval:    QRS Duration: 86 QT Interval:  412 QTC Calculation: 557 R Axis:   69 Text Interpretation: Atrial fibrillation with rapid ventricular response with premature ventricular or aberrantly conducted complexes Low voltage QRS Nonspecific ST and T wave abnormality  Prolonged QT Abnormal ECG Interpretation limited secondary to artifact Confirmed by Fredia Sorrow [phone]) on 10/21/2020 1:54:47 PM   EKG Interpretation  Date/Time:  Monday October 21 2020 13:07:16 EST Ventricular Rate:  79 PR Interval:    QRS Duration: 116 QT Interval:  404 QTC Calculation: 464 R Axis:   71 Text Interpretation: Sinus rhythm Nonspecific intraventricular conduction delay Low voltage with right axis deviation Confirmed by Fredia Sorrow [phone]) on 10/21/2020 1:55:29 PM        Radiology DG Chest Portable 1 View  Result Date: 10/21/2020 CLINICAL DATA:  Hypoxia. EXAM: PORTABLE CHEST 1 VIEW COMPARISON:  November 24, 2017. FINDINGS: The heart size and mediastinal contours are within normal limits. No pneumothorax or pleural effusion is noted. Left lung is clear. Right midlung and basilar opacities are noted concerning for pneumonia. The visualized skeletal structures are unremarkable. IMPRESSION: Right midlung and basilar opacities are noted concerning for pneumonia. Electronically Signed   By: Marijo Conception M.D.   On: 10/21/2020 12:59    Procedures .Critical Care Performed by: Barrie Folk, PA-C Authorized by: Barrie Folk, PA-C   Critical care provider statement:    Critical care time (minutes):  33   Critical care time was exclusive of:  Separately billable procedures and treating other patients and teaching time   Critical care was necessary to treat or prevent imminent or life-threatening deterioration of the following conditions:  Respiratory failure (2/2 covid)   Critical care was time spent personally by me on the following activities:  Development of treatment plan with patient or surrogate, evaluation of patient's response to treatment, examination of patient, obtaining history from patient or surrogate, ordering and performing treatments and interventions, ordering  and review of laboratory studies, ordering and review of radiographic  studies, pulse oximetry, re-evaluation of patient's condition and review of old charts   (including critical care time)  Medications Ordered in ED Medications  remdesivir 100 mg in sodium chloride 0.9 % 100 mL IVPB (has no administration in time range)  remdesivir 100 mg in sodium chloride 0.9 % 100 mL IVPB (has no administration in time range)  albuterol (VENTOLIN HFA) 108 (90 Base) MCG/ACT inhaler 4-6 puff (6 puffs Inhalation Given 10/21/20 1302)  methylPREDNISolone sodium succinate (SOLU-MEDROL) 125 mg/2 mL injection 125 mg (125 mg Intravenous Given 10/21/20 1301)    ED Course  I have reviewed the triage vital signs and the nursing notes.  Pertinent labs & imaging results that were available during my care of the patient were reviewed by me and considered in my medical decision making (see chart for details).    MDM Rules/Calculators/A&P                          History provided by patient with additional history obtained from chart review.    74 year old male presenting with shortness of breath.  He was found to be hypoxic on ED arrival with oxygen saturation 81%.  Placed on 4 L nasal cannula with improvement to 91%.  This is a new oxygen requirement.  On my exam he has wheezing heard in all lung fields.  He hass accessory muscle use.  Waiting for Covid test result.  Patient given albuterol inhaler and IV Solu-Medrol. EKG in triage shows long QTC at 557, QT is 412 and A. fib with RVR.  EKG looks to be poor quality. Repeat EKG shows shows sinus rhythm.  QT and QTc 404 and 464. DDX includes COPD exacerbation, covid, pneumonia, PE, less likely ACS. Chest x-ray viewed by me is concerning for pneumonia with right midlung and basilar opacities.Looks to have opacities on left lung base as well.  Covid test is positive. CBC without leukocytosis, hemoglobin consistent with baseline. BMP without significant electrolyte derangement, BUN elevated at 68, creatinine 2.62 consistent with baseline,  GFR 25.  Remdesivir ordered per pharmacy consult. This case was discussed with Dr. Rogene Houston who agrees with plan to admit. Spoke with Dr. Wynetta Emery with hospitalist service who agrees to assume care of patient and bring into the hospital for further evaluation and management.    Covid admission labs added to work-up.  Hospitalist to follow-up on results. Attempted to call patient's daughter to provide update at patient's request. She did not answer, HIPPA compliant voicemail left.   Darren Burke was evaluated in Emergency Department on 10/21/2020 for the symptoms described in the history of present illness. He was evaluated in the context of the global COVID-19 pandemic, which necessitated consideration that the patient might be at risk for infection with the SARS-CoV-2 virus that causes COVID-19. Institutional protocols and algorithms that pertain to the evaluation of patients at risk for COVID-19 are in a state of rapid change based on information released by regulatory bodies including the CDC and federal and state organizations. These policies and algorithms were followed during the patient's care in the ED.  Portions of this note were generated with [employer]. Dictation errors may occur despite best attempts at proofreading.    Final Clinical Impression(s) / ED Diagnoses Final diagnoses:  Pneumonia due to COVID-19 virus  Acute respiratory failure with hypoxia ([place])    Rx / DC Orders ED Discharge Orders  None       Barrie Folk, PA-C 10/21/20 1409    Barrie Folk, PA-C 10/21/20 1411    Fredia Sorrow, MD 11/04/20 [phone]

## 2020-10-21 NOTE — ED Notes (Signed)
Pt voided, changed bedding, cleaned pt.

## 2020-10-21 NOTE — ED Notes (Signed)
Pts daughter called and would like for pt to receive the antibodies treatment for COVID.

## 2020-10-21 NOTE — ED Triage Notes (Signed)
Pt sent to the ED from PCP for shortness of breath.  Pt does not wear oxygen at home and oxygen saturation is 80-81 on room air.  Pt has been placed on 4 L [place] and sats increased to 87

## 2020-10-21 NOTE — ED Notes (Signed)
Pt in bed, pt has removed [place], pt satting in low 70s, replaced [place], pt O2 sat gradually returned to low 90s, reminded pt that he needs to keep the oxygen on for his own well being.  Pt verbalized understanding.

## 2020-10-21 NOTE — ED Notes (Signed)
CRITICAL VALUE ALERT  Critical Value:  covid positive  Date & Time Notied:  10/21/2020, 1325  Provider Notified: Verline Lema PA  Orders Received/Actions taken: see chart

## 2020-10-22 ENCOUNTER — Inpatient Hospital Stay (HOSPITAL_COMMUNITY): Payer: Medicare Other

## 2020-10-22 DIAGNOSIS — J1282 Pneumonia due to coronavirus disease 2019: Secondary | ICD-10-CM | POA: Diagnosis not present

## 2020-10-22 DIAGNOSIS — U071 COVID-19: Secondary | ICD-10-CM | POA: Diagnosis not present

## 2020-10-22 LAB — CBC WITH DIFFERENTIAL/PLATELET
Abs Immature Granulocytes: 0.06 10*3/uL (ref 0.00–0.07)
Basophils Absolute: 0 10*3/uL (ref 0.0–0.1)
Basophils Relative: 0 %
Eosinophils Absolute: 0 10*3/uL (ref 0.0–0.5)
Eosinophils Relative: 0 %
HCT: 34.4 % — ABNORMAL LOW (ref 39.0–52.0)
Hemoglobin: 10.6 g/dL — ABNORMAL LOW (ref 13.0–17.0)
Immature Granulocytes: 1 %
Lymphocytes Relative: 8 %
Lymphs Abs: 0.4 10*3/uL — ABNORMAL LOW (ref 0.7–4.0)
MCH: 30 pg (ref 26.0–34.0)
MCHC: 30.8 g/dL (ref 30.0–36.0)
MCV: 97.5 fL (ref 80.0–100.0)
Monocytes Absolute: 0.2 10*3/uL (ref 0.1–1.0)
Monocytes Relative: 4 %
Neutro Abs: 4.5 10*3/uL (ref 1.7–7.7)
Neutrophils Relative %: 87 %
Platelets: 237 10*3/uL (ref 150–400)
RBC: 3.53 MIL/uL — ABNORMAL LOW (ref 4.22–5.81)
RDW: 13.6 % (ref 11.5–15.5)
WBC: 5.2 10*3/uL (ref 4.0–10.5)
nRBC: 0.4 % — ABNORMAL HIGH (ref 0.0–0.2)

## 2020-10-22 LAB — COMPREHENSIVE METABOLIC PANEL
ALT: 56 U/L — ABNORMAL HIGH (ref 0–44)
AST: 89 U/L — ABNORMAL HIGH (ref 15–41)
Albumin: 2.5 g/dL — ABNORMAL LOW (ref 3.5–5.0)
Alkaline Phosphatase: 36 U/L — ABNORMAL LOW (ref 38–126)
Anion gap: 11 (ref 5–15)
BUN: 67 mg/dL — ABNORMAL HIGH (ref 8–23)
CO2: 22 mmol/L (ref 22–32)
Calcium: 8 mg/dL — ABNORMAL LOW (ref 8.9–10.3)
Chloride: 108 mmol/L (ref 98–111)
Creatinine, Ser: 2.21 mg/dL — ABNORMAL HIGH (ref 0.61–1.24)
GFR, Estimated: 31 mL/min — ABNORMAL LOW (ref >60)
Glucose, Bld: 147 mg/dL — ABNORMAL HIGH (ref 70–99)
Potassium: 5 mmol/L (ref 3.5–5.1)
Sodium: 141 mmol/L (ref 135–145)
Total Bilirubin: 1 mg/dL (ref 0.3–1.2)
Total Protein: 6.7 g/dL (ref 6.5–8.1)

## 2020-10-22 LAB — C-REACTIVE PROTEIN: CRP: 12.5 mg/dL — ABNORMAL HIGH (ref <1.0)

## 2020-10-22 LAB — MAGNESIUM: Magnesium: 2.7 mg/dL — ABNORMAL HIGH (ref 1.7–2.4)

## 2020-10-22 LAB — FERRITIN: Ferritin: 435 ng/mL — ABNORMAL HIGH (ref 24–336)

## 2020-10-22 LAB — D-DIMER, QUANTITATIVE: D-Dimer, Quant: 1.25 ug/mL-FEU — ABNORMAL HIGH (ref 0.00–0.50)

## 2020-10-22 LAB — PHOSPHORUS: Phosphorus: 4.9 mg/dL — ABNORMAL HIGH (ref 2.5–4.6)

## 2020-10-22 MED ORDER — SODIUM CHLORIDE 0.9 % IV SOLN
100.0000 mg | INTRAVENOUS | Status: AC
  Administered 2020-10-22 (×2): 100 mg via INTRAVENOUS
  Filled 2020-10-22 (×2): qty 20

## 2020-10-22 MED ORDER — SODIUM CHLORIDE 0.9 % IV SOLN
100.0000 mg | Freq: Every day | INTRAVENOUS | Status: AC
  Administered 2020-10-23 – 2020-10-26 (×4): 100 mg via INTRAVENOUS
  Filled 2020-10-22 (×5): qty 20

## 2020-10-22 MED ORDER — ENOXAPARIN SODIUM 60 MG/0.6ML ~~LOC~~ SOLN
50.0000 mg | SUBCUTANEOUS | Status: DC
  Administered 2020-10-22 – 2020-10-25 (×3): 50 mg via SUBCUTANEOUS
  Filled 2020-10-22 (×4): qty 0.6

## 2020-10-22 MED ORDER — MELATONIN 3 MG PO TABS
6.0000 mg | ORAL_TABLET | Freq: Every evening | ORAL | Status: DC | PRN
  Administered 2020-10-23 – 2020-10-25 (×2): 6 mg via ORAL
  Filled 2020-10-22 (×2): qty 2

## 2020-10-22 NOTE — Progress Notes (Signed)
PROGRESS NOTE    Darren Burke  IRC:[id number] DOB: 07/04/1946 DOA: 10/21/2020 PCP: Rosita Fire, MD   Brief Narrative:  Darren Burke is a 74 y.o. male with medical history significant for longtime tobacco use, COPD (emphysema), GERD, stage IV CKD, chronic constipation, hypertension, paroxysmal atrial flutter who presented to the emergency department from PCP office with shortness of breath and hypoxia.  He was noted to have a pulse ox of 80 to 81% on room air.  Patient was admitted with acute hypoxemic respiratory failure secondary to Covid pneumonia and is unvaccinated.  He and his family were unsure of starting remdesivir, but would now like to start this medication and therefore it will be initiated 12/28.  His hypoxemia is worsening and is currently at 10 L nasal cannula.  Assessment & Plan:   Principal Problem:   Pneumonia due to COVID-19 virus Active Problems:   Pulmonary emphysema ([place])   COPD with acute exacerbation (HCC)   SOB (shortness of breath)   Hypoxia   Acute and chronic respiratory failure with hypoxia (HCC)   HTN (hypertension)   Paroxysmal atrial flutter (HCC)   Anemia   CKD (chronic kidney disease), stage IV (HCC)   Transaminitis   1. Acute respiratory failure with hypoxia secondary to Covid pneumonia-patient is unvaccinated.  He has underlying chronic lung disease and other co-morbidities.  He remains High Risk for adverse outcome and severe lung parenchymal injury.    He and his family had refused remdesivir on admission, therefore he was initiated only on IV steroids and oxygen supplementation.  They are now agreeable to remdesivir after having done the research.  He is noted to have a downward trend in inflammatory markers, but is requiring increasing oxygen supplementation this morning.  May need to consider baricitinib if he is further worsening in a.m. and family is agreeable to this medication.  Plan to start remdesivir for today. 2. Stage IV CKD -his  renal function is stable from recent testing and we will be following it daily. 3. COPD with acute exacerbation secondary to Covid infection treating with IV steroids and bronchodilators as above. 4. Anemia in CKD-follow CBC closely currently stable from recent testing. 5. Paroxysmal atrial flutter-currently in sinus rhythm.  6. Essential hypertension - resume home carvedilol. Following.  7. Chronic constipation - laxative therapy ordered.  8. Transaminitis - likely reactive from current Covid infection.  Following.  Stable.  DVT prophylaxis: Lovenox Code Status: Full Family Communication: Discussed with daughter on phone 12/28 Disposition Plan:  Status is: Inpatient  Remains inpatient appropriate because:IV treatments appropriate due to intensity of illness or inability to take PO and Inpatient level of care appropriate due to severity of illness   Dispo: The patient is from: Home              Anticipated d/c is to: Home              Anticipated d/c date is: 3 days              Patient currently is not medically stable to d/c.  Patient requires IV steroids as well as remdesivir.   Consultants:   None  Procedures:   None  Antimicrobials:  Anti-infectives (From admission, onward)   Start     Dose/Rate Route Frequency Ordered Stop   10/22/20 1000  remdesivir 100 mg in sodium chloride 0.9 % 100 mL IVPB  Status:  Discontinued        100 mg 200 mL/hr over  30 Minutes Intravenous Daily 10/21/20 1353 10/21/20 1418   10/21/20 1430  remdesivir 100 mg in sodium chloride 0.9 % 100 mL IVPB  Status:  Discontinued        100 mg 200 mL/hr over 30 Minutes Intravenous Every 1 hr x 2 10/21/20 1353 10/21/20 1418       Subjective: Patient seen and evaluated today with ongoing shortness of breath on 10 L nasal cannula oxygen.  No acute overnight events since admission.  Patient and family now agreeable to use of remdesivir.  Objective: Vitals:   10/22/20 0215 10/22/20 0252 10/22/20 0542  10/22/20 0845  BP: (!) 150/127  (!) 142/76   Pulse: 72  85   Resp: 18  20   Temp: 97.7 F (36.5 C)  97.6 F (36.4 C)   TempSrc:      SpO2: 100% (S) (!) 88% 97% 94%  Weight:      Height:        Intake/Output Summary (Last 24 hours) at 10/22/2020 1148 Last data filed at 10/22/2020 0700 Gross per 24 hour  Intake 289.67 ml  Output 250 ml  Net 39.67 ml   Filed Weights   10/21/20 1155  Weight: 101.2 kg    Examination:  General exam: Appears calm and comfortable  Respiratory system: Diminished bilaterally. Respiratory effort normal.  Currently on 10 L nasal cannula oxygen. Cardiovascular system: S1 & S2 heard, RRR.  Gastrointestinal system: Abdomen is soft Central nervous system: Alert and awake Extremities: No edema Skin: No significant lesions noted Psychiatry: Flat affect.    Data Reviewed: I have personally reviewed following labs and imaging studies  CBC: Recent Labs  Lab 10/21/20 1202 10/21/20 1525 10/22/20 0435  WBC 8.9 7.4 5.2  NEUTROABS  --   --  4.5  HGB 12.2* 11.7* 10.6*  HCT 38.8* 37.7* 34.4*  MCV 96.5 95.9 97.5  PLT 247 243 948   Basic Metabolic Panel: Recent Labs  Lab 10/21/20 1202 10/21/20 1525 10/22/20 0435  NA 140  --  141  K 4.5  --  5.0  CL 104  --  108  CO2 24  --  22  GLUCOSE 121*  --  147*  BUN 68*  --  67*  CREATININE 2.62* 2.49* 2.21*  CALCIUM 8.5*  --  8.0*  MG  --   --  2.7*  PHOS  --   --  4.9*   GFR: Estimated Creatinine Clearance: 34.2 mL/min (A) (by C-G formula based on SCr of 2.21 mg/dL (H)). Liver Function Tests: Recent Labs  Lab 10/21/20 1424 10/22/20 0435  AST 126* 89*  ALT 69* 56*  ALKPHOS 44 36*  BILITOT 0.8 1.0  PROT 7.6 6.7  ALBUMIN 3.0* 2.5*   No results for input(s): LIPASE, AMYLASE in the last 168 hours. No results for input(s): AMMONIA in the last 168 hours. Coagulation Profile: No results for input(s): INR, PROTIME in the last 168 hours. Cardiac Enzymes: No results for input(s): CKTOTAL, CKMB,  CKMBINDEX, TROPONINI in the last 168 hours. BNP (last 3 results) No results for input(s): PROBNP in the last 8760 hours. HbA1C: No results for input(s): HGBA1C in the last 72 hours. CBG: No results for input(s): GLUCAP in the last 168 hours. Lipid Profile: Recent Labs    10/21/20 1424  TRIG 163*   Thyroid Function Tests: No results for input(s): TSH, T4TOTAL, FREET4, T3FREE, THYROIDAB in the last 72 hours. Anemia Panel: Recent Labs    10/21/20 1224 10/22/20 0435  FERRITIN 543*  435*   Sepsis Labs: Recent Labs  Lab 10/21/20 1424  PROCALCITON 0.25  LATICACIDVEN 1.8    Recent Results (from the past 240 hour(s))  Resp Panel by RT-PCR (Flu A&B, Covid) Nasopharyngeal Swab     Status: Abnormal   Collection Time: 10/21/20 12:03 PM   Specimen: Nasopharyngeal Swab; Nasopharyngeal(NP) swabs in vial transport medium  Result Value Ref Range Status   SARS Coronavirus 2 by RT PCR POSITIVE (A) NEGATIVE Final    Comment: RESULT CALLED TO, READ BACK BY AND VERIFIED WITH: CARDWELL,L AT 1325 BY HUFFINES,S ON 10/21/20 (NOTE) SARS-CoV-2 target nucleic acids are DETECTED.  The SARS-CoV-2 RNA is generally detectable in upper respiratory specimens during the acute phase of infection. Positive results are indicative of the presence of the identified virus, but do not rule out bacterial infection or co-infection with other pathogens not detected by the test. Clinical correlation with patient history and other diagnostic information is necessary to determine patient infection status. The expected result is Negative.  Fact Sheet for Patients: EntrepreneurPulse.com.au  Fact Sheet for Healthcare Providers: IncredibleEmployment.be  This test is not yet approved or cleared by the [place] FDA and  has been authorized for detection and/or diagnosis of SARS-CoV-2 by FDA under an Emergency Use Authorization (EUA).  This EUA will remain in effect (meaning  this  test can be used) for the duration of  the COVID-19 declaration under Section 564(b)(1) of the Act, 21 U.S.C. section 360bbb-3(b)(1), unless the authorization is terminated or revoked sooner.     Influenza A by PCR NEGATIVE NEGATIVE Final   Influenza B by PCR NEGATIVE NEGATIVE Final    Comment: (NOTE) The Xpert Xpress SARS-CoV-2/FLU/RSV plus assay is intended as an aid in the diagnosis of influenza from Nasopharyngeal swab specimens and should not be used as a sole basis for treatment. Nasal washings and aspirates are unacceptable for Xpert Xpress SARS-CoV-2/FLU/RSV testing.  Fact Sheet for Patients: EntrepreneurPulse.com.au  Fact Sheet for Healthcare Providers: IncredibleEmployment.be  This test is not yet approved or cleared by the [place] FDA and has been authorized for detection and/or diagnosis of SARS-CoV-2 by FDA under an Emergency Use Authorization (EUA). This EUA will remain in effect (meaning this test can be used) for the duration of the COVID-19 declaration under Section 564(b)(1) of the Act, 21 U.S.C. section 360bbb-3(b)(1), unless the authorization is terminated or revoked.  Performed at [facility], [address], [place], [place] [address]   Blood Culture (routine x 2)     Status: None (Preliminary result)   Collection Time: 10/21/20  2:24 PM   Specimen: BLOOD  Result Value Ref Range Status   Specimen Description BLOOD RIGHT ANTECUBITAL  Final   Special Requests   Final    BOTTLES DRAWN AEROBIC AND ANAEROBIC Blood Culture adequate volume   Culture   Final    NO GROWTH < 24 HOURS Performed at [facility], [address] [place], [place] [address]    Report Status PENDING  Incomplete  Blood Culture (routine x 2)     Status: None (Preliminary result)   Collection Time: 10/21/20  3:25 PM   Specimen: BLOOD  Result Value Ref Range Status   Specimen Description BLOOD LEFT ANTECUBITAL  Final   Special Requests    Final    BOTTLES DRAWN AEROBIC AND ANAEROBIC Blood Culture adequate volume   Culture   Final    NO GROWTH < 24 HOURS Performed at [facility], [address] [place],  [address]  Report Status PENDING  Incomplete         Radiology Studies: Portable chest 1 View  Result Date: 10/22/2020 CLINICAL DATA:  Shortness of breath. EXAM: PORTABLE CHEST 1 VIEW COMPARISON:  10/21/2020.  11/24/2017. FINDINGS: Mediastinum hilar structures normal. Cardiomegaly with pulmonary venous congestion bilateral interstitial prominence. Small bilateral pleural effusions. Findings suggest CHF. Pneumonitis cannot be excluded. Associated bibasilar atelectasis and or infiltrates. Bibasilar pneumonia cannot be excluded. IMPRESSION: 1. Cardiomegaly with pulmonary venous congestion bilateral interstitial prominence. Small bilateral pleural effusions. Findings suggest CHF. 2. Associated bibasilar atelectasis and or infiltrates. Bibasilar pneumonia cannot be excluded. Electronically Signed   By: Marcello Moores  Register   On: 10/22/2020 05:11   DG Chest Portable 1 View  Result Date: 10/21/2020 CLINICAL DATA:  Hypoxia. EXAM: PORTABLE CHEST 1 VIEW COMPARISON:  November 24, 2017. FINDINGS: The heart size and mediastinal contours are within normal limits. No pneumothorax or pleural effusion is noted. Left lung is clear. Right midlung and basilar opacities are noted concerning for pneumonia. The visualized skeletal structures are unremarkable. IMPRESSION: Right midlung and basilar opacities are noted concerning for pneumonia. Electronically Signed   By: Marijo Conception M.D.   On: 10/21/2020 12:59        Scheduled Meds: . vitamin C  500 mg Oral Daily  . aspirin EC  81 mg Oral Daily  . carvedilol  3.125 mg Oral BID WC  . enoxaparin (LOVENOX) injection  50 mg Subcutaneous Q24H  . Ipratropium-Albuterol  1 puff Inhalation Q6H  . methylPREDNISolone (SOLU-MEDROL) injection  0.5 mg/kg Intravenous Q12H   Followed by  . [START  ON 10/24/2020] predniSONE  50 mg Oral Daily  . pantoprazole (PROTONIX) IV  40 mg Intravenous QHS  . zinc sulfate  220 mg Oral Daily   Continuous Infusions: . sodium chloride 50 mL/hr at 10/22/20 0046     LOS: 1 day    Time spent: 35 minutes    Sahej Schrieber Darleen Crocker, DO [facility]  If 7PM-7AM, please contact night-coverage [email or web address] 10/22/2020, 11:48 AM

## 2020-10-22 NOTE — Progress Notes (Signed)
CRITICAL VALUE ALERT  Critical Value:  Gram positive cocci Blood Culture  Date & Time Notied:  10/22/20 22:10  Provider Notified: Dr. Sharlet Salina  Orders Received/Actions taken: Awaiting MD orders

## 2020-10-22 NOTE — Plan of Care (Signed)

## 2020-10-23 ENCOUNTER — Encounter (HOSPITAL_COMMUNITY): Payer: Self-pay | Admitting: Family Medicine

## 2020-10-23 DIAGNOSIS — D631 Anemia in chronic kidney disease: Secondary | ICD-10-CM

## 2020-10-23 DIAGNOSIS — U071 COVID-19: Secondary | ICD-10-CM | POA: Diagnosis not present

## 2020-10-23 DIAGNOSIS — N184 Chronic kidney disease, stage 4 (severe): Secondary | ICD-10-CM | POA: Diagnosis not present

## 2020-10-23 DIAGNOSIS — R0902 Hypoxemia: Secondary | ICD-10-CM

## 2020-10-23 DIAGNOSIS — J441 Chronic obstructive pulmonary disease with (acute) exacerbation: Secondary | ICD-10-CM | POA: Diagnosis not present

## 2020-10-23 DIAGNOSIS — J9601 Acute respiratory failure with hypoxia: Secondary | ICD-10-CM | POA: Diagnosis not present

## 2020-10-23 LAB — BLOOD CULTURE ID PANEL (REFLEXED) - BCID2

## 2020-10-23 LAB — COMPREHENSIVE METABOLIC PANEL
ALT: 54 U/L — ABNORMAL HIGH (ref 0–44)
AST: 64 U/L — ABNORMAL HIGH (ref 15–41)
Albumin: 2.5 g/dL — ABNORMAL LOW (ref 3.5–5.0)
Alkaline Phosphatase: 37 U/L — ABNORMAL LOW (ref 38–126)
Anion gap: 10 (ref 5–15)
BUN: 67 mg/dL — ABNORMAL HIGH (ref 8–23)
CO2: 25 mmol/L (ref 22–32)
Calcium: 8.3 mg/dL — ABNORMAL LOW (ref 8.9–10.3)
Chloride: 109 mmol/L (ref 98–111)
Creatinine, Ser: 2.1 mg/dL — ABNORMAL HIGH (ref 0.61–1.24)
GFR, Estimated: 33 mL/min — ABNORMAL LOW (ref >60)
Glucose, Bld: 141 mg/dL — ABNORMAL HIGH (ref 70–99)
Potassium: 4.5 mmol/L (ref 3.5–5.1)
Sodium: 144 mmol/L (ref 135–145)
Total Bilirubin: 0.8 mg/dL (ref 0.3–1.2)
Total Protein: 6.7 g/dL (ref 6.5–8.1)

## 2020-10-23 LAB — FERRITIN: Ferritin: 469 ng/mL — ABNORMAL HIGH (ref 24–336)

## 2020-10-23 LAB — CBC WITH DIFFERENTIAL/PLATELET
Abs Immature Granulocytes: 0.08 10*3/uL — ABNORMAL HIGH (ref 0.00–0.07)
Basophils Absolute: 0 10*3/uL (ref 0.0–0.1)
Basophils Relative: 0 %
Eosinophils Absolute: 0 10*3/uL (ref 0.0–0.5)
Eosinophils Relative: 0 %
HCT: 36.1 % — ABNORMAL LOW (ref 39.0–52.0)
Hemoglobin: 10.8 g/dL — ABNORMAL LOW (ref 13.0–17.0)
Immature Granulocytes: 1 %
Lymphocytes Relative: 7 %
Lymphs Abs: 0.6 10*3/uL — ABNORMAL LOW (ref 0.7–4.0)
MCH: 29.4 pg (ref 26.0–34.0)
MCHC: 29.9 g/dL — ABNORMAL LOW (ref 30.0–36.0)
MCV: 98.4 fL (ref 80.0–100.0)
Monocytes Absolute: 0.5 10*3/uL (ref 0.1–1.0)
Monocytes Relative: 5 %
Neutro Abs: 7.3 10*3/uL (ref 1.7–7.7)
Neutrophils Relative %: 87 %
Platelets: 277 10*3/uL (ref 150–400)
RBC: 3.67 MIL/uL — ABNORMAL LOW (ref 4.22–5.81)
RDW: 13.5 % (ref 11.5–15.5)
WBC: 8.4 10*3/uL (ref 4.0–10.5)
nRBC: 0.2 % (ref 0.0–0.2)

## 2020-10-23 LAB — BLOOD GAS, ARTERIAL
Acid-Base Excess: 1.3 mmol/L (ref 0.0–2.0)
Bicarbonate: 24.8 mmol/L (ref 20.0–28.0)
FIO2: 80
O2 Saturation: 96.6 %
Patient temperature: 37.1
pCO2 arterial: 55 mmHg — ABNORMAL HIGH (ref 32.0–48.0)
pH, Arterial: 7.31 — ABNORMAL LOW (ref 7.350–7.450)
pO2, Arterial: 107 mmHg (ref 83.0–108.0)

## 2020-10-23 LAB — C-REACTIVE PROTEIN: CRP: 7.7 mg/dL — ABNORMAL HIGH (ref <1.0)

## 2020-10-23 LAB — PHOSPHORUS: Phosphorus: 4.6 mg/dL (ref 2.5–4.6)

## 2020-10-23 LAB — D-DIMER, QUANTITATIVE: D-Dimer, Quant: 1.11 ug/mL-FEU — ABNORMAL HIGH (ref 0.00–0.50)

## 2020-10-23 LAB — MAGNESIUM: Magnesium: 2.6 mg/dL — ABNORMAL HIGH (ref 1.7–2.4)

## 2020-10-23 MED ORDER — BARICITINIB 2 MG PO TABS
2.0000 mg | ORAL_TABLET | Freq: Every day | ORAL | Status: AC
  Administered 2020-10-23 – 2020-11-05 (×12): 2 mg via ORAL
  Filled 2020-10-23 (×15): qty 1

## 2020-10-23 MED ORDER — PANTOPRAZOLE SODIUM 40 MG PO TBEC
40.0000 mg | DELAYED_RELEASE_TABLET | Freq: Every day | ORAL | Status: DC
  Administered 2020-10-23 – 2020-10-31 (×7): 40 mg via ORAL
  Filled 2020-10-23 (×9): qty 1

## 2020-10-23 MED ORDER — CHLORHEXIDINE GLUCONATE CLOTH 2 % EX PADS
6.0000 | MEDICATED_PAD | Freq: Every day | CUTANEOUS | Status: DC
  Administered 2020-10-23 – 2020-11-12 (×19): 6 via TOPICAL

## 2020-10-23 NOTE — Progress Notes (Signed)
Pt transported to stepdown with all personal belongings.

## 2020-10-23 NOTE — Progress Notes (Signed)
PROGRESS NOTE    Darren Burke  AST:[id number] DOB: 12/23/1945 DOA: 10/21/2020 PCP: Rosita Fire, MD   Brief Narrative:  Darren Burke is a 74 y.o. male with medical history significant for longtime tobacco use, COPD (emphysema), GERD, stage IV CKD, chronic constipation, hypertension, paroxysmal atrial flutter who presented to the emergency department from PCP office with shortness of breath and hypoxia.  He was noted to have a pulse ox of 80 to 81% on room air.  Patient was admitted with acute hypoxemic respiratory failure secondary to Covid pneumonia and is unvaccinated.  He and his family were unsure of starting remdesivir, but would now like to start this medication and therefore it will be initiated 12/28.  His hypoxemia is worsening and is currently at 10 L nasal cannula.  Assessment & Plan:   Principal Problem:   Pneumonia due to COVID-19 virus Active Problems:   Pulmonary emphysema ([place])   COPD with acute exacerbation (HCC)   SOB (shortness of breath)   Hypoxia   Acute and chronic respiratory failure with hypoxia (HCC)   HTN (hypertension)   Paroxysmal atrial flutter (HCC)   Anemia   CKD (chronic kidney disease), stage IV (HCC)   Transaminitis   1. Acute respiratory failure with hypoxia secondary to Covid pneumonia-patient is unvaccinated.  He has underlying chronic lung disease and other co-morbidities.  He remains High Risk for adverse outcome and severe lung parenchymal injury. He and his family initially refused the use of remdesivir; on 10/22/2020 accepted to be treated along with the use of his steroids.  Today in the setting of worsening oxygen requirement and increased inflammatory markers also in agreement to start patient on barcitinib.  We will continue weaning off oxygen supplementation as tolerated, continue supportive care, continue the use of incentive respirometer, flutter valve and proning position.  Patient will be kept on as needed antitussive medications  and bronchodilators.  Continue to follow inflammatory markers. 2. Chronic stage IV kidney disease -his renal function is stable overall and will continue following trend. Continue minimizing nephrotoxic agents and avoiding hypotension.  3. COPD with acute exacerbation secondary to Covid infection treating with IV steroids and bronchodilators as mentioned above. 4. Anemia in CKD-follow CBC closely; no signs of overt bleeding appreciated. 5. Paroxysmal atrial flutter-currently in sinus rhythm.  Rate controlled; no chronically on anticoagulation.  Continue aspirin. 6. Essential hypertension -continue Coreg; blood pressure stable. 7. Chronic constipation - laxative therapy ordered.  Follow response and further improve bowel management as required. 8. Transaminitis - likely reactive from current Covid infection.    Improving.  Continue to follow stability.  DVT prophylaxis: Lovenox Code Status: Full Family Communication: Discussed with daughter and son over the phone on  12/29 Disposition Plan:  Status is: Inpatient  Remains inpatient appropriate because:IV treatments appropriate due to intensity of illness or inability to take PO and Inpatient level of care appropriate due to severity of illness   Dispo: The patient is from: Home              Anticipated d/c is to: Home              Anticipated d/c date is: 3 days or so.              Patient currently is not medically stable to d/c.  Patient requires IV steroids as well as remdesivir.  Also requiring higher level of oxygen supplementation will be started on baricitinib.   Consultants:   None  Procedures:  None  Antimicrobials:  Anti-infectives (From admission, onward)   Start     Dose/Rate Route Frequency Ordered Stop   10/23/20 1000  remdesivir 100 mg in sodium chloride 0.9 % 100 mL IVPB       "Followed by" Linked Group Details   100 mg 200 mL/hr over 30 Minutes Intravenous Daily 10/22/20 1154 10/27/20 0959   10/22/20 1200   remdesivir 100 mg in sodium chloride 0.9 % 100 mL IVPB       "Followed by" Linked Group Details   100 mg 200 mL/hr over 30 Minutes Intravenous Every 30 min 10/22/20 1154 10/22/20 1436   10/22/20 1000  remdesivir 100 mg in sodium chloride 0.9 % 100 mL IVPB  Status:  Discontinued        100 mg 200 mL/hr over 30 Minutes Intravenous Daily 10/21/20 1353 10/21/20 1418   10/21/20 1430  remdesivir 100 mg in sodium chloride 0.9 % 100 mL IVPB  Status:  Discontinued        100 mg 200 mL/hr over 30 Minutes Intravenous Every 1 hr x 2 10/21/20 1353 10/21/20 1418      Subjective: Afebrile, no chest pain, no nausea, no vomiting.  Overnight with increased oxygen requirement worsening respiratory status.  Up to 15 L nasal cannula supplementation and transferred to stepdown for progressive fusion and management.  Patient having positive tachypnea difficulty speaking in full sentences.  Objective: Vitals:   10/23/20 1100 10/23/20 1200 10/23/20 1300 10/23/20 1400  BP: (!) 161/72 (!) 168/90 (!) 156/70 130/83  Pulse: 74 85 74 74  Resp: 20 (!) 28 (!) 21 (!) 22  Temp:      TempSrc:      SpO2: 92% 93% 92% 94%  Weight:      Height:        Intake/Output Summary (Last 24 hours) at 10/23/2020 1616 Last data filed at 10/23/2020 0908 Gross per 24 hour  Intake --  Output 350 ml  Net -350 ml   Filed Weights   10/21/20 1155  Weight: 101.2 kg    Examination: General exam: Febrile, no chest pain, no nausea, no vomiting.  Overnight with significant respiratory distress and increasing oxygen requirement.  Patient has been transferred to stepdown bed and was using high flow nasal cannula supplementation up to 15 L.  Increased respiratory rate appreciated.  Patient became confused and was having some difficulty following commands. Respiratory system: Mild difficulty speaking in full sentences; positive tachypnea.  No using accessory muscle.  Reports being short of breath and overnight and receiving up to 15 L  nasal cannula supplementation.  Throughout today's evaluation and care patient is down to 3-4 L high flow with O2 sat above 93%. Cardiovascular system:RRR. No murmurs, rubs, gallops.  No JVD. Gastrointestinal system: Abdomen is nondistended, soft and nontender. No organomegaly or masses felt. Normal bowel sounds heard. Central nervous system: No focal neurological deficits. Extremities: No C/C/E, +pedal pulses Skin: No petechiae. Psychiatry: Flat affect, slightly anxious and intermittently confused as per nursing reports.   Data Reviewed: I have personally reviewed following labs and imaging studies  CBC: Recent Labs  Lab 10/21/20 1202 10/21/20 1525 10/22/20 0435 10/23/20 0518  WBC 8.9 7.4 5.2 8.4  NEUTROABS  --   --  4.5 7.3  HGB 12.2* 11.7* 10.6* 10.8*  HCT 38.8* 37.7* 34.4* 36.1*  MCV 96.5 95.9 97.5 98.4  PLT 247 243 237 174   Basic Metabolic Panel: Recent Labs  Lab 10/21/20 1202 10/21/20 1525 10/22/20 0435 10/23/20  0518  NA 140  --  141 144  K 4.5  --  5.0 4.5  CL 104  --  108 109  CO2 24  --  22 25  GLUCOSE 121*  --  147* 141*  BUN 68*  --  67* 67*  CREATININE 2.62* 2.49* 2.21* 2.10*  CALCIUM 8.5*  --  8.0* 8.3*  MG  --   --  2.7* 2.6*  PHOS  --   --  4.9* 4.6   GFR: Estimated Creatinine Clearance: 36 mL/min (A) (by C-G formula based on SCr of 2.1 mg/dL (H)).   Liver Function Tests: Recent Labs  Lab 10/21/20 1424 10/22/20 0435 10/23/20 0518  AST 126* 89* 64*  ALT 69* 56* 54*  ALKPHOS 44 36* 37*  BILITOT 0.8 1.0 0.8  PROT 7.6 6.7 6.7  ALBUMIN 3.0* 2.5* 2.5*   Lipid Profile: Recent Labs    10/21/20 1424  TRIG 163*   Anemia Panel: Recent Labs    10/22/20 0435 10/23/20 0518  FERRITIN 435* 469*   Sepsis Labs: Recent Labs  Lab 10/21/20 1424  PROCALCITON 0.25  LATICACIDVEN 1.8    Recent Results (from the past 240 hour(s))  Resp Panel by RT-PCR (Flu A&B, Covid) Nasopharyngeal Swab     Status: Abnormal   Collection Time: 10/21/20 12:03 PM    Specimen: Nasopharyngeal Swab; Nasopharyngeal(NP) swabs in vial transport medium  Result Value Ref Range Status   SARS Coronavirus 2 by RT PCR POSITIVE (A) NEGATIVE Final    Comment: RESULT CALLED TO, READ BACK BY AND VERIFIED WITH: CARDWELL,L AT 1325 BY HUFFINES,S ON 10/21/20 (NOTE) SARS-CoV-2 target nucleic acids are DETECTED.  The SARS-CoV-2 RNA is generally detectable in upper respiratory specimens during the acute phase of infection. Positive results are indicative of the presence of the identified virus, but do not rule out bacterial infection or co-infection with other pathogens not detected by the test. Clinical correlation with patient history and other diagnostic information is necessary to determine patient infection status. The expected result is Negative.  Fact Sheet for Patients: EntrepreneurPulse.com.au  Fact Sheet for Healthcare Providers: IncredibleEmployment.be  This test is not yet approved or cleared by the [place] FDA and  has been authorized for detection and/or diagnosis of SARS-CoV-2 by FDA under an Emergency Use Authorization (EUA).  This EUA will remain in effect (meaning this  test can be used) for the duration of  the COVID-19 declaration under Section 564(b)(1) of the Act, 21 U.S.C. section 360bbb-3(b)(1), unless the authorization is terminated or revoked sooner.     Influenza A by PCR NEGATIVE NEGATIVE Final   Influenza B by PCR NEGATIVE NEGATIVE Final    Comment: (NOTE) The Xpert Xpress SARS-CoV-2/FLU/RSV plus assay is intended as an aid in the diagnosis of influenza from Nasopharyngeal swab specimens and should not be used as a sole basis for treatment. Nasal washings and aspirates are unacceptable for Xpert Xpress SARS-CoV-2/FLU/RSV testing.  Fact Sheet for Patients: EntrepreneurPulse.com.au  Fact Sheet for Healthcare Providers: IncredibleEmployment.be  This  test is not yet approved or cleared by the [place] FDA and has been authorized for detection and/or diagnosis of SARS-CoV-2 by FDA under an Emergency Use Authorization (EUA). This EUA will remain in effect (meaning this test can be used) for the duration of the COVID-19 declaration under Section 564(b)(1) of the Act, 21 U.S.C. section 360bbb-3(b)(1), unless the authorization is terminated or revoked.  Performed at [facility], [address], [place],  [address]   Blood  Culture (routine x 2)     Status: None (Preliminary result)   Collection Time: 10/21/20  2:24 PM   Specimen: BLOOD  Result Value Ref Range Status   Specimen Description   Final    BLOOD RIGHT ANTECUBITAL Performed at [facility], [address], [place], [place] [address]    Special Requests   Final    BOTTLES DRAWN AEROBIC AND ANAEROBIC Blood Culture adequate volume Performed at [facility], [address] [place], [place] [address]    Culture  Setup Time   Final    AEROBIC BOTTLE ONLY GRAM POSITIVE COCCI Gram Stain Report Called to,Read Back By and Verified With: S BOLES,RN@[id number]  10/22/20 MKELLY Organism ID to follow Performed at [facility], [place] [address] [place], [place] [address]    Culture   Final    NO GROWTH < 24 HOURS Performed at [facility], [address], [place], [place] [address]    Report Status PENDING  Incomplete  Blood Culture ID Panel (Reflexed)     Status: None   Collection Time: 10/21/20  2:24 PM  Result Value Ref Range Status   Enterococcus faecalis NOT DETECTED NOT DETECTED Final   Enterococcus Faecium NOT DETECTED NOT DETECTED Final   Listeria monocytogenes NOT DETECTED NOT DETECTED Final   Staphylococcus species NOT DETECTED NOT DETECTED Final   Staphylococcus aureus (BCID) NOT DETECTED NOT DETECTED Final   Staphylococcus epidermidis NOT DETECTED NOT DETECTED Final   Staphylococcus lugdunensis NOT DETECTED NOT DETECTED Final   Streptococcus species NOT  DETECTED NOT DETECTED Final   Streptococcus agalactiae NOT DETECTED NOT DETECTED Final   Streptococcus pneumoniae NOT DETECTED NOT DETECTED Final   Streptococcus pyogenes NOT DETECTED NOT DETECTED Final   A.calcoaceticus-baumannii NOT DETECTED NOT DETECTED Final   Bacteroides fragilis NOT DETECTED NOT DETECTED Final   Enterobacterales NOT DETECTED NOT DETECTED Final   Enterobacter cloacae complex NOT DETECTED NOT DETECTED Final   Escherichia coli NOT DETECTED NOT DETECTED Final   Klebsiella aerogenes NOT DETECTED NOT DETECTED Final   Klebsiella oxytoca NOT DETECTED NOT DETECTED Final   Klebsiella pneumoniae NOT DETECTED NOT DETECTED Final   Proteus species NOT DETECTED NOT DETECTED Final   Salmonella species NOT DETECTED NOT DETECTED Final   Serratia marcescens NOT DETECTED NOT DETECTED Final   Haemophilus influenzae NOT DETECTED NOT DETECTED Final   Neisseria meningitidis NOT DETECTED NOT DETECTED Final   Pseudomonas aeruginosa NOT DETECTED NOT DETECTED Final   Stenotrophomonas maltophilia NOT DETECTED NOT DETECTED Final   Candida albicans NOT DETECTED NOT DETECTED Final   Candida auris NOT DETECTED NOT DETECTED Final   Candida glabrata NOT DETECTED NOT DETECTED Final   Candida krusei NOT DETECTED NOT DETECTED Final   Candida parapsilosis NOT DETECTED NOT DETECTED Final   Candida tropicalis NOT DETECTED NOT DETECTED Final   Cryptococcus neoformans/gattii NOT DETECTED NOT DETECTED Final    Comment: Performed at [facility], [place]. [address] [place], [place] [address]  Blood Culture (routine x 2)     Status: None (Preliminary result)   Collection Time: 10/21/20  3:25 PM   Specimen: BLOOD  Result Value Ref Range Status   Specimen Description BLOOD LEFT ANTECUBITAL  Final   Special Requests   Final    BOTTLES DRAWN AEROBIC AND ANAEROBIC Blood Culture adequate volume   Culture   Final    NO GROWTH < 24 HOURS Performed at [facility], [address] [place], [place] [address]     Report  Status PENDING  Incomplete     Radiology Studies: Portable chest 1 View  Result Date: 10/22/2020 CLINICAL DATA:  Shortness of breath. EXAM: PORTABLE CHEST 1 VIEW COMPARISON:  10/21/2020.  11/24/2017. FINDINGS: Mediastinum hilar structures normal. Cardiomegaly with pulmonary venous congestion bilateral interstitial prominence. Small bilateral pleural effusions. Findings suggest CHF. Pneumonitis cannot be excluded. Associated bibasilar atelectasis and or infiltrates. Bibasilar pneumonia cannot be excluded. IMPRESSION: 1. Cardiomegaly with pulmonary venous congestion bilateral interstitial prominence. Small bilateral pleural effusions. Findings suggest CHF. 2. Associated bibasilar atelectasis and or infiltrates. Bibasilar pneumonia cannot be excluded. Electronically Signed   By: Marcello Moores  Register   On: 10/22/2020 05:11    Scheduled Meds: . vitamin C  500 mg Oral Daily  . aspirin EC  81 mg Oral Daily  . baricitinib  2 mg Oral Daily  . carvedilol  3.125 mg Oral BID WC  . Chlorhexidine Gluconate Cloth  6 each Topical Daily  . enoxaparin (LOVENOX) injection  50 mg Subcutaneous Q24H  . Ipratropium-Albuterol  1 puff Inhalation Q6H  . methylPREDNISolone (SOLU-MEDROL) injection  0.5 mg/kg Intravenous Q12H  . pantoprazole  40 mg Oral Daily  . zinc sulfate  220 mg Oral Daily   Continuous Infusions: . sodium chloride 50 mL/hr at 10/22/20 0046  . remdesivir 100 mg in NS 100 mL 100 mg (10/23/20 0908)     LOS: 2 days    Time spent: 97 minutes    Barton Dubois, MD [facility]  If 7PM-7AM, please contact night-coverage [email or web address] 10/23/2020, 4:16 PM

## 2020-10-23 NOTE — Progress Notes (Signed)
Notified MD Olevia Bowens and RT [place] regarding pt's o2 increase requirements and desaturation as well as intermittent confusion. Pt is able to answer all orientation questions correctly but seems to be confused with cognition and understanding. MD notified of pt current condition and pt placed on continuous telemetry and SpO2 reading. RT to get ABG on pt. Will continue to monitor.

## 2020-10-23 NOTE — Progress Notes (Signed)
RN called to inform this RT that patient's O2 sat was low and she increased flow to 15L on the [place].  I asked RN to place patient on continuous pulse ox through tele.

## 2020-10-23 NOTE — Progress Notes (Signed)
Updated daughter on current pt condition and MD orders. Daughter to call back later.

## 2020-10-24 DIAGNOSIS — N184 Chronic kidney disease, stage 4 (severe): Secondary | ICD-10-CM | POA: Diagnosis not present

## 2020-10-24 DIAGNOSIS — U071 COVID-19: Secondary | ICD-10-CM | POA: Diagnosis not present

## 2020-10-24 DIAGNOSIS — J9601 Acute respiratory failure with hypoxia: Secondary | ICD-10-CM | POA: Diagnosis not present

## 2020-10-24 DIAGNOSIS — J441 Chronic obstructive pulmonary disease with (acute) exacerbation: Secondary | ICD-10-CM | POA: Diagnosis not present

## 2020-10-24 LAB — CBC WITH DIFFERENTIAL/PLATELET
Abs Immature Granulocytes: 0.08 10*3/uL — ABNORMAL HIGH (ref 0.00–0.07)
Basophils Absolute: 0 10*3/uL (ref 0.0–0.1)
Basophils Relative: 0 %
Eosinophils Absolute: 0 10*3/uL (ref 0.0–0.5)
Eosinophils Relative: 0 %
HCT: 36.8 % — ABNORMAL LOW (ref 39.0–52.0)
Hemoglobin: 11.2 g/dL — ABNORMAL LOW (ref 13.0–17.0)
Immature Granulocytes: 1 %
Lymphocytes Relative: 6 %
Lymphs Abs: 0.7 10*3/uL (ref 0.7–4.0)
MCH: 29.9 pg (ref 26.0–34.0)
MCHC: 30.4 g/dL (ref 30.0–36.0)
MCV: 98.4 fL (ref 80.0–100.0)
Monocytes Absolute: 0.5 10*3/uL (ref 0.1–1.0)
Monocytes Relative: 5 %
Neutro Abs: 9 10*3/uL — ABNORMAL HIGH (ref 1.7–7.7)
Neutrophils Relative %: 88 %
Platelets: 321 10*3/uL (ref 150–400)
RBC: 3.74 MIL/uL — ABNORMAL LOW (ref 4.22–5.81)
RDW: 13.5 % (ref 11.5–15.5)
WBC: 10.2 10*3/uL (ref 4.0–10.5)
nRBC: 0.2 % (ref 0.0–0.2)

## 2020-10-24 LAB — COMPREHENSIVE METABOLIC PANEL
ALT: 56 U/L — ABNORMAL HIGH (ref 0–44)
AST: 52 U/L — ABNORMAL HIGH (ref 15–41)
Albumin: 2.6 g/dL — ABNORMAL LOW (ref 3.5–5.0)
Alkaline Phosphatase: 35 U/L — ABNORMAL LOW (ref 38–126)
Anion gap: 11 (ref 5–15)
BUN: 72 mg/dL — ABNORMAL HIGH (ref 8–23)
CO2: 25 mmol/L (ref 22–32)
Calcium: 8.4 mg/dL — ABNORMAL LOW (ref 8.9–10.3)
Chloride: 112 mmol/L — ABNORMAL HIGH (ref 98–111)
Creatinine, Ser: 1.97 mg/dL — ABNORMAL HIGH (ref 0.61–1.24)
GFR, Estimated: 35 mL/min — ABNORMAL LOW (ref >60)
Glucose, Bld: 143 mg/dL — ABNORMAL HIGH (ref 70–99)
Potassium: 4.7 mmol/L (ref 3.5–5.1)
Sodium: 148 mmol/L — ABNORMAL HIGH (ref 135–145)
Total Bilirubin: 1 mg/dL (ref 0.3–1.2)
Total Protein: 6.8 g/dL (ref 6.5–8.1)

## 2020-10-24 LAB — MAGNESIUM: Magnesium: 2.8 mg/dL — ABNORMAL HIGH (ref 1.7–2.4)

## 2020-10-24 LAB — CULTURE, BLOOD (ROUTINE X 2): Special Requests: ADEQUATE

## 2020-10-24 LAB — PHOSPHORUS: Phosphorus: 4.2 mg/dL (ref 2.5–4.6)

## 2020-10-24 LAB — C-REACTIVE PROTEIN: CRP: 4.6 mg/dL — ABNORMAL HIGH (ref <1.0)

## 2020-10-24 LAB — FERRITIN: Ferritin: 522 ng/mL — ABNORMAL HIGH (ref 24–336)

## 2020-10-24 LAB — MRSA PCR SCREENING: MRSA by PCR: NEGATIVE

## 2020-10-24 LAB — D-DIMER, QUANTITATIVE: D-Dimer, Quant: 3.78 ug/mL-FEU — ABNORMAL HIGH (ref 0.00–0.50)

## 2020-10-24 MED ORDER — QUETIAPINE FUMARATE 25 MG PO TABS
12.5000 mg | ORAL_TABLET | Freq: Every day | ORAL | Status: DC
  Administered 2020-10-24: 22:00:00 12.5 mg via ORAL
  Filled 2020-10-24: qty 1

## 2020-10-24 MED ORDER — HALOPERIDOL LACTATE 5 MG/ML IJ SOLN
0.5000 mg | Freq: Four times a day (QID) | INTRAMUSCULAR | Status: DC | PRN
  Administered 2020-10-24 – 2020-10-25 (×3): 0.5 mg via INTRAVENOUS
  Filled 2020-10-24 (×4): qty 1

## 2020-10-24 NOTE — Progress Notes (Signed)
PROGRESS NOTE    Darren Burke  CVE:[id number] DOB: 21-Dec-1945 DOA: 10/21/2020 PCP: Rosita Fire, MD   Brief Narrative:  Darren Burke is a 74 y.o. male with medical history significant for longtime tobacco use, COPD (emphysema), GERD, stage IV CKD, chronic constipation, hypertension, paroxysmal atrial flutter who presented to the emergency department from PCP office with shortness of breath and hypoxia.  He was noted to have a pulse ox of 80 to 81% on room air.  Patient was admitted with acute hypoxemic respiratory failure secondary to Covid pneumonia and is unvaccinated.  He and his family were unsure of starting remdesivir, but would now like to start this medication and therefore it will be initiated 12/28.  His hypoxemia is worsening and is currently at 10 L nasal cannula.  Assessment & Plan:   Principal Problem:   Pneumonia due to COVID-19 virus Active Problems:   Pulmonary emphysema ([place])   COPD with acute exacerbation (HCC)   SOB (shortness of breath)   Hypoxia   Acute and chronic respiratory failure with hypoxia (HCC)   HTN (hypertension)   Paroxysmal atrial flutter (HCC)   Anemia   CKD (chronic kidney disease), stage IV (HCC)   Transaminitis   1. Acute respiratory failure with hypoxia secondary to Covid pneumonia-patient is unvaccinated.  He has underlying chronic lung disease and other co-morbidities.  He remains High Risk for adverse outcome and severe lung parenchymal injury. He and his family initially refused the use of remdesivir; on 10/22/2020 accepted to be treated along with the use of his steroids.  Patient with good response to the initiation of her CT knee pain and currently down to just 3-5 L high flow nasal cannula supplementation.  We will continue current management, supportive care and continue to wean off supplementation as tolerated.   2. Chronic stage IV kidney disease -his renal function is stable overall and will continue following trend. Continue  minimizing nephrotoxic agents and avoiding hypotension.  3. COPD with acute exacerbation secondary to Covid infection treating with IV steroids and bronchodilators as mentioned above. 4. Anemia in CKD-follow CBC closely; no signs of overt bleeding appreciated. 5. Paroxysmal atrial flutter-currently in sinus rhythm.  Rate controlled; no chronically on anticoagulation.  Continue aspirin. 6. Essential hypertension -continue Coreg; blood pressure stable. 7. Chronic constipation - laxative therapy ordered.  Follow response and further improve bowel management as required. 8. Transaminitis - likely reactive from current Covid infection.    Improving.  Continue to follow stability. 9. Metabolic encephalopathy/hospital-acquired delirium: Soft restraints required overnight after pulling on IVs and becoming disoriented.  Low-dose Seroquel will be added.  Follow response and continue supportive care.  Constant reorientation will be provided.  DVT prophylaxis: Lovenox Code Status: Full Family Communication: Discussed with daughter and son over the phone on  12/29 Disposition Plan:  Status is: Inpatient  Remains inpatient appropriate because:IV treatments appropriate due to intensity of illness or inability to take PO and Inpatient level of care appropriate due to severity of illness   Dispo: The patient is from: Home              Anticipated d/c is to: Home              Anticipated d/c date is: 3 days or so.              Patient currently is not medically stable to d/c.  Patient requires IV steroids as well as remdesivir.  Will continue the use of baricitinib,  start low-dose Seroquel to assist with mood fluctuation.   Consultants:   None  Procedures:   None  Antimicrobials:  Anti-infectives (From admission, onward)   Start     Dose/Rate Route Frequency Ordered Stop   10/23/20 1000  remdesivir 100 mg in sodium chloride 0.9 % 100 mL IVPB       "Followed by" Linked Group Details   100 mg 200  mL/hr over 30 Minutes Intravenous Daily 10/22/20 1154 10/27/20 0959   10/22/20 1200  remdesivir 100 mg in sodium chloride 0.9 % 100 mL IVPB       "Followed by" Linked Group Details   100 mg 200 mL/hr over 30 Minutes Intravenous Every 30 min 10/22/20 1154 10/22/20 1436   10/22/20 1000  remdesivir 100 mg in sodium chloride 0.9 % 100 mL IVPB  Status:  Discontinued        100 mg 200 mL/hr over 30 Minutes Intravenous Daily 10/21/20 1353 10/21/20 1418   10/21/20 1430  remdesivir 100 mg in sodium chloride 0.9 % 100 mL IVPB  Status:  Discontinued        100 mg 200 mL/hr over 30 Minutes Intravenous Every 1 hr x 2 10/21/20 1353 10/21/20 1418      Subjective: No fever, no chest pain, no nausea, no vomiting.  Continues to be intermittently confused and demonstrating poor insight/judgment.  Couple IVs pulled out throughout the night and in the requiring to be placed in soft restraints.  Using 3-5 L nasal cannula supplementation.  Objective: Vitals:   10/24/20 1400 10/24/20 1500 10/24/20 1548 10/24/20 1600  BP:   (!) 144/103 (!) 155/79  Pulse: 86 (!) 101 (!) 101 (!) 102  Resp: (!) 30  (!) 28 (!) 23  Temp:      TempSrc:      SpO2: 99% 92% 94% 94%  Weight:      Height:        Intake/Output Summary (Last 24 hours) at 10/24/2020 1700 Last data filed at 10/24/2020 1130 Gross per 24 hour  Intake 734.39 ml  Output 300 ml  Net 434.39 ml   Filed Weights   10/21/20 1155  Weight: 101.2 kg    Examination: General exam: Alert, awake and following simple commands; patient is oriented to person and place but having difficulty with insight and judgment currently.  High concern for hospital-acquired delirium/encephalopathy.  Overnight required to be placed in restraints after being pulling on IVs and becoming disoriented.  Using 3-5 L nasal cannula supplementation at this time.  No chest pain, no nausea, no vomiting. Respiratory system: Positive rhonchi bilaterally; mild difficulty speaking in full  sentences.  No using accessory muscle.  Mild expiratory wheezing. Cardiovascular system:RRR. No murmurs, rubs, gallops.  No JVD appreciated on exam. Gastrointestinal system: Abdomen is nondistended, soft and nontender. No organomegaly or masses felt. Normal bowel sounds heard. Central nervous system: No focal neurological deficits. Extremities: No C cyanosis or clubbing. Skin: No rashes, no petechiae. Psychiatry: Judgement and insight appear to be currently impaired in the setting of hospital-acquired delirium/metabolic encephalopathy.     Data Reviewed: I have personally reviewed following labs and imaging studies  CBC: Recent Labs  Lab 10/21/20 1202 10/21/20 1525 10/22/20 0435 10/23/20 0518 10/24/20 0603  WBC 8.9 7.4 5.2 8.4 10.2  NEUTROABS  --   --  4.5 7.3 9.0*  HGB 12.2* 11.7* 10.6* 10.8* 11.2*  HCT 38.8* 37.7* 34.4* 36.1* 36.8*  MCV 96.5 95.9 97.5 98.4 98.4  PLT 247 243 237 277  025   Basic Metabolic Panel: Recent Labs  Lab 10/21/20 1202 10/21/20 1525 10/22/20 0435 10/23/20 0518 10/24/20 0603  NA 140  --  141 144 148*  K 4.5  --  5.0 4.5 4.7  CL 104  --  108 109 112*  CO2 24  --  22 25 25   GLUCOSE 121*  --  147* 141* 143*  BUN 68*  --  67* 67* 72*  CREATININE 2.62* 2.49* 2.21* 2.10* 1.97*  CALCIUM 8.5*  --  8.0* 8.3* 8.4*  MG  --   --  2.7* 2.6* 2.8*  PHOS  --   --  4.9* 4.6 4.2   GFR: Estimated Creatinine Clearance: 38.4 mL/min (A) (by C-G formula based on SCr of 1.97 mg/dL (H)).   Liver Function Tests: Recent Labs  Lab 10/21/20 1424 10/22/20 0435 10/23/20 0518 10/24/20 0603  AST 126* 89* 64* 52*  ALT 69* 56* 54* 56*  ALKPHOS 44 36* 37* 35*  BILITOT 0.8 1.0 0.8 1.0  PROT 7.6 6.7 6.7 6.8  ALBUMIN 3.0* 2.5* 2.5* 2.6*   Lipid Profile: No results for input(s): CHOL, HDL, LDLCALC, TRIG, CHOLHDL, LDLDIRECT in the last 72 hours.   Anemia Panel: Recent Labs    10/23/20 0518 10/24/20 0603  FERRITIN 469* 522*   Sepsis Labs: Recent Labs  Lab  10/21/20 1424  PROCALCITON 0.25  LATICACIDVEN 1.8    Recent Results (from the past 240 hour(s))  Resp Panel by RT-PCR (Flu A&B, Covid) Nasopharyngeal Swab     Status: Abnormal   Collection Time: 10/21/20 12:03 PM   Specimen: Nasopharyngeal Swab; Nasopharyngeal(NP) swabs in vial transport medium  Result Value Ref Range Status   SARS Coronavirus 2 by RT PCR POSITIVE (A) NEGATIVE Final    Comment: RESULT CALLED TO, READ BACK BY AND VERIFIED WITH: CARDWELL,L AT 1325 BY HUFFINES,S ON 10/21/20 (NOTE) SARS-CoV-2 target nucleic acids are DETECTED.  The SARS-CoV-2 RNA is generally detectable in upper respiratory specimens during the acute phase of infection. Positive results are indicative of the presence of the identified virus, but do not rule out bacterial infection or co-infection with other pathogens not detected by the test. Clinical correlation with patient history and other diagnostic information is necessary to determine patient infection status. The expected result is Negative.  Fact Sheet for Patients: EntrepreneurPulse.com.au  Fact Sheet for Healthcare Providers: IncredibleEmployment.be  This test is not yet approved or cleared by the [place] FDA and  has been authorized for detection and/or diagnosis of SARS-CoV-2 by FDA under an Emergency Use Authorization (EUA).  This EUA will remain in effect (meaning this  test can be used) for the duration of  the COVID-19 declaration under Section 564(b)(1) of the Act, 21 U.S.C. section 360bbb-3(b)(1), unless the authorization is terminated or revoked sooner.     Influenza A by PCR NEGATIVE NEGATIVE Final   Influenza B by PCR NEGATIVE NEGATIVE Final    Comment: (NOTE) The Xpert Xpress SARS-CoV-2/FLU/RSV plus assay is intended as an aid in the diagnosis of influenza from Nasopharyngeal swab specimens and should not be used as a sole basis for treatment. Nasal washings and aspirates are  unacceptable for Xpert Xpress SARS-CoV-2/FLU/RSV testing.  Fact Sheet for Patients: EntrepreneurPulse.com.au  Fact Sheet for Healthcare Providers: IncredibleEmployment.be  This test is not yet approved or cleared by the [place] FDA and has been authorized for detection and/or diagnosis of SARS-CoV-2 by FDA under an Emergency Use Authorization (EUA). This EUA will remain in effect (meaning this test  can be used) for the duration of the COVID-19 declaration under Section 564(b)(1) of the Act, 21 U.S.C. section 360bbb-3(b)(1), unless the authorization is terminated or revoked.  Performed at [facility], [address], [place], [place] [address]   Blood Culture (routine x 2)     Status: Abnormal   Collection Time: 10/21/20  2:24 PM   Specimen: BLOOD  Result Value Ref Range Status   Specimen Description   Final    BLOOD RIGHT ANTECUBITAL Performed at [facility], [address], [place], [place] [address]    Special Requests   Final    BOTTLES DRAWN AEROBIC AND ANAEROBIC Blood Culture adequate volume Performed at [facility], [address], [place], [place] [address]    Culture  Setup Time   Final    AEROBIC BOTTLE ONLY GRAM POSITIVE COCCI Gram Stain Report Called to,Read Back By and Verified With: S BOLES,RN@2119  10/22/20 MKELLY Organism ID to follow    Culture (A)  Final    MICROCOCCUS SPECIES Standardized susceptibility testing for this organism is not available. Performed at [facility], [place] [address], [place], [place] [address]    Report Status 10/24/2020 FINAL  Final  Blood Culture ID Panel (Reflexed)     Status: None   Collection Time: 10/21/20  2:24 PM  Result Value Ref Range Status   Enterococcus faecalis NOT DETECTED NOT DETECTED Final   Enterococcus Faecium NOT DETECTED NOT DETECTED Final   Listeria monocytogenes NOT DETECTED NOT DETECTED Final   Staphylococcus species NOT DETECTED NOT DETECTED Final    Staphylococcus aureus (BCID) NOT DETECTED NOT DETECTED Final   Staphylococcus epidermidis NOT DETECTED NOT DETECTED Final   Staphylococcus lugdunensis NOT DETECTED NOT DETECTED Final   Streptococcus species NOT DETECTED NOT DETECTED Final   Streptococcus agalactiae NOT DETECTED NOT DETECTED Final   Streptococcus pneumoniae NOT DETECTED NOT DETECTED Final   Streptococcus pyogenes NOT DETECTED NOT DETECTED Final   A.calcoaceticus-baumannii NOT DETECTED NOT DETECTED Final   Bacteroides fragilis NOT DETECTED NOT DETECTED Final   Enterobacterales NOT DETECTED NOT DETECTED Final   Enterobacter cloacae complex NOT DETECTED NOT DETECTED Final   Escherichia coli NOT DETECTED NOT DETECTED Final   Klebsiella aerogenes NOT DETECTED NOT DETECTED Final   Klebsiella oxytoca NOT DETECTED NOT DETECTED Final   Klebsiella pneumoniae NOT DETECTED NOT DETECTED Final   Proteus species NOT DETECTED NOT DETECTED Final   Salmonella species NOT DETECTED NOT DETECTED Final   Serratia marcescens NOT DETECTED NOT DETECTED Final   Haemophilus influenzae NOT DETECTED NOT DETECTED Final   Neisseria meningitidis NOT DETECTED NOT DETECTED Final   Pseudomonas aeruginosa NOT DETECTED NOT DETECTED Final   Stenotrophomonas maltophilia NOT DETECTED NOT DETECTED Final   Candida albicans NOT DETECTED NOT DETECTED Final   Candida auris NOT DETECTED NOT DETECTED Final   Candida glabrata NOT DETECTED NOT DETECTED Final   Candida krusei NOT DETECTED NOT DETECTED Final   Candida parapsilosis NOT DETECTED NOT DETECTED Final   Candida tropicalis NOT DETECTED NOT DETECTED Final   Cryptococcus neoformans/gattii NOT DETECTED NOT DETECTED Final    Comment: Performed at [facility], [address] [place], [place] [address]  Blood Culture (routine x 2)     Status: None (Preliminary result)   Collection Time: 10/21/20  3:25 PM   Specimen: BLOOD  Result Value Ref Range Status   Specimen Description BLOOD LEFT ANTECUBITAL  Final    Special Requests   Final    BOTTLES DRAWN  AEROBIC AND ANAEROBIC Blood Culture adequate volume   Culture   Final    NO GROWTH 3 DAYS Performed at [facility], [address], [place], [place] [address]    Report Status PENDING  Incomplete  MRSA PCR Screening     Status: None   Collection Time: 10/23/20 10:15 AM   Specimen: Nasopharyngeal  Result Value Ref Range Status   MRSA by PCR NEGATIVE NEGATIVE Final    Comment:        The GeneXpert MRSA Assay (FDA approved for NASAL specimens only), is one component of a comprehensive MRSA colonization surveillance program. It is not intended to diagnose MRSA infection nor to guide or monitor treatment for MRSA infections. Performed at [facility], [address], [place], [place] [address]      Radiology Studies: No results found.  Scheduled Meds: . vitamin C  500 mg Oral Daily  . aspirin EC  81 mg Oral Daily  . baricitinib  2 mg Oral Daily  . carvedilol  3.125 mg Oral BID WC  . Chlorhexidine Gluconate Cloth  6 each Topical Daily  . enoxaparin (LOVENOX) injection  50 mg Subcutaneous Q24H  . pantoprazole  40 mg Oral Daily  . QUEtiapine  12.5 mg Oral QHS  . zinc sulfate  220 mg Oral Daily   Continuous Infusions: . sodium chloride 50 mL/hr at 10/24/20 0840  . remdesivir 100 mg in NS 100 mL Stopped (10/24/20 0913)     LOS: 3 days    Time spent: 35 minutes    Barton Dubois, MD [facility]  If 7PM-7AM, please contact night-coverage [email or web address] 10/24/2020, 5:00 PM

## 2020-10-24 NOTE — Progress Notes (Signed)
Pt has been refusing PO intake all day. Will not drink liquids or eat any of his meals. Took morning meds with sip of water this AM but refused evening dose of coreg. Spit it out twice and just repeats "help me". Notified MD.
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
## 2020-10-24 NOTE — Progress Notes (Signed)
Pt frequently yelling out for [place] to "open the door". Explained to pt precautions and why door has to remain closed. Able to answer most orientation questions- only disoriented to time, but continues to yell out if staff is not in the room. When patient is asked what he needs help with he just yells "help me" and "let me out". Frequent attempts to re-orient and calm him down.

## 2020-10-25 ENCOUNTER — Inpatient Hospital Stay (HOSPITAL_COMMUNITY): Payer: Medicare Other

## 2020-10-25 DIAGNOSIS — J441 Chronic obstructive pulmonary disease with (acute) exacerbation: Secondary | ICD-10-CM | POA: Diagnosis not present

## 2020-10-25 DIAGNOSIS — N184 Chronic kidney disease, stage 4 (severe): Secondary | ICD-10-CM | POA: Diagnosis not present

## 2020-10-25 DIAGNOSIS — U071 COVID-19: Secondary | ICD-10-CM | POA: Diagnosis not present

## 2020-10-25 DIAGNOSIS — J9601 Acute respiratory failure with hypoxia: Secondary | ICD-10-CM | POA: Diagnosis not present

## 2020-10-25 LAB — CBC WITH DIFFERENTIAL/PLATELET
Abs Immature Granulocytes: 0.09 10*3/uL — ABNORMAL HIGH (ref 0.00–0.07)
Basophils Absolute: 0 10*3/uL (ref 0.0–0.1)
Basophils Relative: 0 %
Eosinophils Absolute: 0 10*3/uL (ref 0.0–0.5)
Eosinophils Relative: 0 %
HCT: 35.5 % — ABNORMAL LOW (ref 39.0–52.0)
Hemoglobin: 10.7 g/dL — ABNORMAL LOW (ref 13.0–17.0)
Immature Granulocytes: 1 %
Lymphocytes Relative: 6 %
Lymphs Abs: 0.6 10*3/uL — ABNORMAL LOW (ref 0.7–4.0)
MCH: 30.2 pg (ref 26.0–34.0)
MCHC: 30.1 g/dL (ref 30.0–36.0)
MCV: 100.3 fL — ABNORMAL HIGH (ref 80.0–100.0)
Monocytes Absolute: 1 10*3/uL (ref 0.1–1.0)
Monocytes Relative: 9 %
Neutro Abs: 9.8 10*3/uL — ABNORMAL HIGH (ref 1.7–7.7)
Neutrophils Relative %: 84 %
Platelets: 303 10*3/uL (ref 150–400)
RBC: 3.54 MIL/uL — ABNORMAL LOW (ref 4.22–5.81)
RDW: 13.9 % (ref 11.5–15.5)
WBC: 11.5 10*3/uL — ABNORMAL HIGH (ref 4.0–10.5)
nRBC: 0 % (ref 0.0–0.2)

## 2020-10-25 LAB — COMPREHENSIVE METABOLIC PANEL
ALT: 66 U/L — ABNORMAL HIGH (ref 0–44)
AST: 57 U/L — ABNORMAL HIGH (ref 15–41)
Albumin: 2.7 g/dL — ABNORMAL LOW (ref 3.5–5.0)
Alkaline Phosphatase: 36 U/L — ABNORMAL LOW (ref 38–126)
Anion gap: 8 (ref 5–15)
BUN: 88 mg/dL — ABNORMAL HIGH (ref 8–23)
CO2: 28 mmol/L (ref 22–32)
Calcium: 8.6 mg/dL — ABNORMAL LOW (ref 8.9–10.3)
Chloride: 115 mmol/L — ABNORMAL HIGH (ref 98–111)
Creatinine, Ser: 2.49 mg/dL — ABNORMAL HIGH (ref 0.61–1.24)
GFR, Estimated: 27 mL/min — ABNORMAL LOW (ref >60)
Glucose, Bld: 124 mg/dL — ABNORMAL HIGH (ref 70–99)
Potassium: 5.2 mmol/L — ABNORMAL HIGH (ref 3.5–5.1)
Sodium: 151 mmol/L — ABNORMAL HIGH (ref 135–145)
Total Bilirubin: 1.1 mg/dL (ref 0.3–1.2)
Total Protein: 6.7 g/dL (ref 6.5–8.1)

## 2020-10-25 LAB — URINALYSIS, ROUTINE W REFLEX MICROSCOPIC
Bilirubin Urine: NEGATIVE
Glucose, UA: NEGATIVE mg/dL
Ketones, ur: NEGATIVE mg/dL
Leukocytes,Ua: NEGATIVE
Nitrite: NEGATIVE
Protein, ur: 30 mg/dL — AB
Specific Gravity, Urine: 1.015 (ref 1.005–1.030)
pH: 5 (ref 5.0–8.0)

## 2020-10-25 LAB — PHOSPHORUS: Phosphorus: 5.3 mg/dL — ABNORMAL HIGH (ref 2.5–4.6)

## 2020-10-25 LAB — BLOOD GAS, ARTERIAL
Acid-Base Excess: 2.8 mmol/L — ABNORMAL HIGH (ref 0.0–2.0)
Bicarbonate: 25.7 mmol/L (ref 20.0–28.0)
FIO2: 28
O2 Saturation: 88.1 %
Patient temperature: 36.1
pCO2 arterial: 59.8 mmHg — ABNORMAL HIGH (ref 32.0–48.0)
pH, Arterial: 7.299 — ABNORMAL LOW (ref 7.350–7.450)
pO2, Arterial: 63.4 mmHg — ABNORMAL LOW (ref 83.0–108.0)

## 2020-10-25 LAB — MAGNESIUM: Magnesium: 3.1 mg/dL — ABNORMAL HIGH (ref 1.7–2.4)

## 2020-10-25 LAB — D-DIMER, QUANTITATIVE: D-Dimer, Quant: 1.03 ug/mL-FEU — ABNORMAL HIGH (ref 0.00–0.50)

## 2020-10-25 LAB — FERRITIN: Ferritin: 536 ng/mL — ABNORMAL HIGH (ref 24–336)

## 2020-10-25 LAB — C-REACTIVE PROTEIN: CRP: 3.9 mg/dL — ABNORMAL HIGH (ref <1.0)

## 2020-10-25 MED ORDER — QUETIAPINE FUMARATE 25 MG PO TABS
12.5000 mg | ORAL_TABLET | Freq: Two times a day (BID) | ORAL | Status: DC
  Administered 2020-10-25 – 2020-10-26 (×3): 12.5 mg via ORAL
  Filled 2020-10-25 (×4): qty 1

## 2020-10-25 MED ORDER — UMECLIDINIUM BROMIDE 62.5 MCG/INH IN AEPB
1.0000 | INHALATION_SPRAY | Freq: Every day | RESPIRATORY_TRACT | Status: DC
  Filled 2020-10-25: qty 7

## 2020-10-25 MED ORDER — DEXAMETHASONE 4 MG PO TABS
6.0000 mg | ORAL_TABLET | Freq: Every day | ORAL | Status: DC
  Administered 2020-10-25 – 2020-10-26 (×2): 6 mg via ORAL
  Filled 2020-10-25 (×2): qty 2

## 2020-10-25 NOTE — Progress Notes (Signed)
PROGRESS NOTE    Darren Burke  GYF:[id number] DOB: 01-30-1946 DOA: 10/21/2020 PCP: Rosita Fire, MD   Brief Narrative:  Darren Burke is a 74 y.o. male with medical history significant for longtime tobacco use, COPD (emphysema), GERD, stage IV CKD, chronic constipation, hypertension, paroxysmal atrial flutter who presented to the emergency department from PCP office with shortness of breath and hypoxia.  He was noted to have a pulse ox of 80 to 81% on room air.  Patient was admitted with acute hypoxemic respiratory failure secondary to Covid pneumonia and is unvaccinated.  He and his family were unsure of starting remdesivir, but would now like to start this medication and therefore it will be initiated 12/28.  His hypoxemia is worsening and is currently at 10 L nasal cannula.  Assessment & Plan:   Principal Problem:   Pneumonia due to COVID-19 virus Active Problems:   Pulmonary emphysema ([place])   COPD with acute exacerbation (HCC)   SOB (shortness of breath)   Hypoxia   Acute and chronic respiratory failure with hypoxia (HCC)   HTN (hypertension)   Paroxysmal atrial flutter (HCC)   Anemia   CKD (chronic kidney disease), stage IV (HCC)   Transaminitis   1. Acute respiratory failure with hypoxia secondary to Covid pneumonia-patient is unvaccinated.  He has underlying chronic lung disease and other co-morbidities.  He remains High Risk for adverse outcome and severe lung parenchymal injury. He and his family initially refused the use of remdesivir; on 10/22/2020 accepted to be treated along with the use of his steroids.  Patient with good response to the initiation of her CT knee pain and currently down to just 3 L high flow nasal cannula supplementation.  We will continue current management, supportive care and continue to wean off supplementation as tolerated.   2. Chronic stage IV kidney disease -his renal function is stable overall and will continue following trend. Continue  minimizing nephrotoxic agents and avoiding hypotension.  3. COPD with acute exacerbation secondary to Covid infection treating with IV steroids and bronchodilators as mentioned above. 4. Anemia in CKD-follow CBC closely; no signs of overt bleeding appreciated. 5. Paroxysmal atrial flutter-currently in sinus rhythm.  Rate controlled; no chronically on anticoagulation.  Continue aspirin. 6. Essential hypertension -continue Coreg; blood pressure stable. 7. Chronic constipation - laxative therapy ordered.  Follow response and further improve bowel management as required. 8. Transaminitis - likely reactive from current Covid infection.    Improving.  Continue to follow stability. 9. Metabolic encephalopathy/hospital-acquired delirium: Soft restraints required overnight after pulling on IVs and becoming disoriented.  Low-dose Seroquel will be adjusted to twice a day; ABG done demonstrating just mild hypercapnic abnormalities (CO2 of 59; patient with chronic COPD and probably around his baseline).  Will follow chest x-ray.  Urinalysis demonstrating some colonization but no active infection with negative nitrite and negative leukocyte esterase..  Follow response and continue constant reorientation.  DVT prophylaxis: Lovenox Code Status: Full Family Communication: Discussed with daughter and son over the phone on  12/29 Disposition Plan:  Status is: Inpatient  Remains inpatient appropriate because:IV treatments appropriate due to intensity of illness or inability to take PO and Inpatient level of care appropriate due to severity of illness   Dispo: The patient is from: Home              Anticipated d/c is to: Home              Anticipated d/c date is: 3 days or  so.              Patient currently is not medically stable to d/c.  Patient requires IV steroids as well as remdesivir.  Will continue the use of baricitinib, start low-dose Seroquel to assist with mood fluctuation.   Consultants:    None  Procedures:   None  Antimicrobials:  Anti-infectives (From admission, onward)   Start     Dose/Rate Route Frequency Ordered Stop   10/23/20 1000  remdesivir 100 mg in sodium chloride 0.9 % 100 mL IVPB       "Followed by" Linked Group Details   100 mg 200 mL/hr over 30 Minutes Intravenous Daily 10/22/20 1154 10/27/20 0959   10/22/20 1200  remdesivir 100 mg in sodium chloride 0.9 % 100 mL IVPB       "Followed by" Linked Group Details   100 mg 200 mL/hr over 30 Minutes Intravenous Every 30 min 10/22/20 1154 10/22/20 1436   10/22/20 1000  remdesivir 100 mg in sodium chloride 0.9 % 100 mL IVPB  Status:  Discontinued        100 mg 200 mL/hr over 30 Minutes Intravenous Daily 10/21/20 1353 10/21/20 1418   10/21/20 1430  remdesivir 100 mg in sodium chloride 0.9 % 100 mL IVPB  Status:  Discontinued        100 mg 200 mL/hr over 30 Minutes Intravenous Every 1 hr x 2 10/21/20 1353 10/21/20 1418      Subjective: No fever, no nausea, no vomiting.  Patient very confused during today's evaluation; with increased tachypnea, placed on 3 L of oxygen supplementation overnight and with the presence of abdominal muscles utilization.  No wheezing, no crackles, requiring Haldol and restraints to control behavior.  Objective: Vitals:   10/25/20 1234 10/25/20 1300 10/25/20 1317 10/25/20 1400  BP: 135/79 119/62  124/62  Pulse: 60 75 77 73  Resp: (!) 30 (!) 25 (!) 99 (!) 21  Temp:      TempSrc:      SpO2: 92% 96% 100% 99%  Weight:      Height:        Intake/Output Summary (Last 24 hours) at 10/25/2020 1453 Last data filed at 10/25/2020 1227 Gross per 24 hour  Intake 325.94 ml  Output --  Net 325.94 ml   Filed Weights   10/21/20 1155  Weight: 101.2 kg    Examination: General exam: Afebrile, no chest pain, no nausea, no vomiting.  Patient very confused during today's evaluation.  Noticed to have increased tachypnea and using abdominal muscles.  He ended being restrained throughout  the night and in the requiring the use of Haldol.  3 L nasal cannula supplementation place overnight; during my evaluation with good saturation on room air. Respiratory system: No crackles, no wheezing, mild use of abdominal muscles and positive tachypnea. Cardiovascular system:RRR. No murmurs, rubs, gallops.  No JVD Gastrointestinal system: Abdomen is nondistended, soft and nontender. No organomegaly or masses felt. Normal bowel sounds heard. Central nervous system: No focal neurological deficits. Extremities: No cyanosis or clubbing. Skin: No rashes, no petechiae. Psychiatry: Unable to properly assess due to current encephalopathy.   Data Reviewed: I have personally reviewed following labs and imaging studies  CBC: Recent Labs  Lab 10/21/20 1525 10/22/20 0435 10/23/20 0518 10/24/20 0603 10/25/20 0646  WBC 7.4 5.2 8.4 10.2 11.5*  NEUTROABS  --  4.5 7.3 9.0* 9.8*  HGB 11.7* 10.6* 10.8* 11.2* 10.7*  HCT 37.7* 34.4* 36.1* 36.8* 35.5*  MCV 95.9 97.5  98.4 98.4 100.3*  PLT 243 237 277 321 397   Basic Metabolic Panel: Recent Labs  Lab 10/21/20 1202 10/21/20 1525 10/22/20 0435 10/23/20 0518 10/24/20 0603 10/25/20 0646  NA 140  --  141 144 148* 151*  K 4.5  --  5.0 4.5 4.7 5.2*  CL 104  --  108 109 112* 115*  CO2 24  --  22 25 25 28   GLUCOSE 121*  --  147* 141* 143* 124*  BUN 68*  --  67* 67* 72* 88*  CREATININE 2.62* 2.49* 2.21* 2.10* 1.97* 2.49*  CALCIUM 8.5*  --  8.0* 8.3* 8.4* 8.6*  MG  --   --  2.7* 2.6* 2.8* 3.1*  PHOS  --   --  4.9* 4.6 4.2 5.3*   GFR: Estimated Creatinine Clearance: 30.4 mL/min (A) (by C-G formula based on SCr of 2.49 mg/dL (H)).   Liver Function Tests: Recent Labs  Lab 10/21/20 1424 10/22/20 0435 10/23/20 0518 10/24/20 0603 10/25/20 0646  AST 126* 89* 64* 52* 57*  ALT 69* 56* 54* 56* 66*  ALKPHOS 44 36* 37* 35* 36*  BILITOT 0.8 1.0 0.8 1.0 1.1  PROT 7.6 6.7 6.7 6.8 6.7  ALBUMIN 3.0* 2.5* 2.5* 2.6* 2.7*   Lipid Profile: No results for  input(s): CHOL, HDL, LDLCALC, TRIG, CHOLHDL, LDLDIRECT in the last 72 hours.   Anemia Panel: Recent Labs    10/24/20 0603 10/25/20 0646  FERRITIN 522* 536*   Sepsis Labs: Recent Labs  Lab 10/21/20 1424  PROCALCITON 0.25  LATICACIDVEN 1.8    Recent Results (from the past 240 hour(s))  Resp Panel by RT-PCR (Flu A&B, Covid) Nasopharyngeal Swab     Status: Abnormal   Collection Time: 10/21/20 12:03 PM   Specimen: Nasopharyngeal Swab; Nasopharyngeal(NP) swabs in vial transport medium  Result Value Ref Range Status   SARS Coronavirus 2 by RT PCR POSITIVE (A) NEGATIVE Final    Comment: RESULT CALLED TO, READ BACK BY AND VERIFIED WITH: CARDWELL,L AT 1325 BY HUFFINES,S ON 10/21/20 (NOTE) SARS-CoV-2 target nucleic acids are DETECTED.  The SARS-CoV-2 RNA is generally detectable in upper respiratory specimens during the acute phase of infection. Positive results are indicative of the presence of the identified virus, but do not rule out bacterial infection or co-infection with other pathogens not detected by the test. Clinical correlation with patient history and other diagnostic information is necessary to determine patient infection status. The expected result is Negative.  Fact Sheet for Patients: EntrepreneurPulse.com.au  Fact Sheet for Healthcare Providers: IncredibleEmployment.be  This test is not yet approved or cleared by the [place] FDA and  has been authorized for detection and/or diagnosis of SARS-CoV-2 by FDA under an Emergency Use Authorization (EUA).  This EUA will remain in effect (meaning this  test can be used) for the duration of  the COVID-19 declaration under Section 564(b)(1) of the Act, 21 U.S.C. section 360bbb-3(b)(1), unless the authorization is terminated or revoked sooner.     Influenza A by PCR NEGATIVE NEGATIVE Final   Influenza B by PCR NEGATIVE NEGATIVE Final    Comment: (NOTE) The Xpert Xpress  SARS-CoV-2/FLU/RSV plus assay is intended as an aid in the diagnosis of influenza from Nasopharyngeal swab specimens and should not be used as a sole basis for treatment. Nasal washings and aspirates are unacceptable for Xpert Xpress SARS-CoV-2/FLU/RSV testing.  Fact Sheet for Patients: EntrepreneurPulse.com.au  Fact Sheet for Healthcare Providers: IncredibleEmployment.be  This test is not yet approved or cleared by the [place]  States FDA and has been authorized for detection and/or diagnosis of SARS-CoV-2 by FDA under an Emergency Use Authorization (EUA). This EUA will remain in effect (meaning this test can be used) for the duration of the COVID-19 declaration under Section 564(b)(1) of the Act, 21 U.S.C. section 360bbb-3(b)(1), unless the authorization is terminated or revoked.  Performed at [facility], [address] [place], [place] [address]   Blood Culture (routine x 2)     Status: Abnormal   Collection Time: 10/21/20  2:24 PM   Specimen: BLOOD  Result Value Ref Range Status   Specimen Description   Final    BLOOD RIGHT ANTECUBITAL Performed at [facility], [address] [place], [place] [address]    Special Requests   Final    BOTTLES DRAWN AEROBIC AND ANAEROBIC Blood Culture adequate volume Performed at [facility], [address] [place], [place] [address]    Culture  Setup Time   Final    AEROBIC BOTTLE ONLY GRAM POSITIVE COCCI Gram Stain Report Called to,Read Back By and Verified With: S BOLES,RN@[id number]  10/22/20 MKELLY Organism ID to follow    Culture (A)  Final    MICROCOCCUS SPECIES Standardized susceptibility testing for this organism is not available. Performed at [facility], [place] [address] [place], [place] [address]    Report Status 10/24/2020 FINAL  Final  Blood Culture ID Panel (Reflexed)     Status: None   Collection Time: 10/21/20  2:24 PM  Result Value Ref Range Status   Enterococcus faecalis NOT  DETECTED NOT DETECTED Final   Enterococcus Faecium NOT DETECTED NOT DETECTED Final   Listeria monocytogenes NOT DETECTED NOT DETECTED Final   Staphylococcus species NOT DETECTED NOT DETECTED Final   Staphylococcus aureus (BCID) NOT DETECTED NOT DETECTED Final   Staphylococcus epidermidis NOT DETECTED NOT DETECTED Final   Staphylococcus lugdunensis NOT DETECTED NOT DETECTED Final   Streptococcus species NOT DETECTED NOT DETECTED Final   Streptococcus agalactiae NOT DETECTED NOT DETECTED Final   Streptococcus pneumoniae NOT DETECTED NOT DETECTED Final   Streptococcus pyogenes NOT DETECTED NOT DETECTED Final   A.calcoaceticus-baumannii NOT DETECTED NOT DETECTED Final   Bacteroides fragilis NOT DETECTED NOT DETECTED Final   Enterobacterales NOT DETECTED NOT DETECTED Final   Enterobacter cloacae complex NOT DETECTED NOT DETECTED Final   Escherichia coli NOT DETECTED NOT DETECTED Final   Klebsiella aerogenes NOT DETECTED NOT DETECTED Final   Klebsiella oxytoca NOT DETECTED NOT DETECTED Final   Klebsiella pneumoniae NOT DETECTED NOT DETECTED Final   Proteus species NOT DETECTED NOT DETECTED Final   Salmonella species NOT DETECTED NOT DETECTED Final   Serratia marcescens NOT DETECTED NOT DETECTED Final   Haemophilus influenzae NOT DETECTED NOT DETECTED Final   Neisseria meningitidis NOT DETECTED NOT DETECTED Final   Pseudomonas aeruginosa NOT DETECTED NOT DETECTED Final   Stenotrophomonas maltophilia NOT DETECTED NOT DETECTED Final   Candida albicans NOT DETECTED NOT DETECTED Final   Candida auris NOT DETECTED NOT DETECTED Final   Candida glabrata NOT DETECTED NOT DETECTED Final   Candida krusei NOT DETECTED NOT DETECTED Final   Candida parapsilosis NOT DETECTED NOT DETECTED Final   Candida tropicalis NOT DETECTED NOT DETECTED Final   Cryptococcus neoformans/gattii NOT DETECTED NOT DETECTED Final    Comment: Performed at [facility], [address], [place], [place] [address]  Blood  Culture (routine x 2)     Status: None (Preliminary result)   Collection Time: 10/21/20  3:25 PM  Specimen: BLOOD  Result Value Ref Range Status   Specimen Description BLOOD LEFT ANTECUBITAL  Final   Special Requests   Final    BOTTLES DRAWN AEROBIC AND ANAEROBIC Blood Culture adequate volume   Culture   Final    NO GROWTH 4 DAYS Performed at [facility], [address] [place], [place] [address]    Report Status PENDING  Incomplete  MRSA PCR Screening     Status: None   Collection Time: 10/23/20 10:15 AM   Specimen: Nasopharyngeal  Result Value Ref Range Status   MRSA by PCR NEGATIVE NEGATIVE Final    Comment:        The GeneXpert MRSA Assay (FDA approved for NASAL specimens only), is one component of a comprehensive MRSA colonization surveillance program. It is not intended to diagnose MRSA infection nor to guide or monitor treatment for MRSA infections. Performed at [facility], [address], [place], [place] [address]      Radiology Studies: DG CHEST PORT 1 VIEW  Result Date: 10/25/2020 CLINICAL DATA:  Worsening shortness of breath today. Positive COVID test on 10/21/2020. Former smoker with COPD. EXAM: PORTABLE CHEST 1 VIEW COMPARISON:  10/22/2020 and older studies. FINDINGS: Cardiac silhouette is normal in size. No mediastinal or hilar masses. Bilateral interstitial thickening noted most evident at the bases, right greater than left, similar to the prior exam. No lung consolidation to suggest pneumonia. No pleural effusion or pneumothorax. Skeletal structures are grossly intact. IMPRESSION: 1. No convincing acute cardiopulmonary disease. 2. Bilateral interstitial thickening, greatest at the right lung base, consistent with fibrosis. Electronically Signed   By: Lajean Manes M.D.   On: 10/25/2020 14:01    Scheduled Meds: . vitamin C  500 mg Oral Daily  . aspirin EC  81 mg Oral Daily  . baricitinib  2 mg Oral Daily  . carvedilol  3.125 mg Oral BID WC  .  Chlorhexidine Gluconate Cloth  6 each Topical Daily  . dexamethasone  6 mg Oral Daily  . enoxaparin (LOVENOX) injection  50 mg Subcutaneous Q24H  . pantoprazole  40 mg Oral Daily  . QUEtiapine  12.5 mg Oral QHS  . umeclidinium bromide  1 puff Inhalation Daily  . zinc sulfate  220 mg Oral Daily   Continuous Infusions: . remdesivir 100 mg in NS 100 mL Stopped (10/25/20 1216)     LOS: 4 days    Time spent: 35 minutes    Barton Dubois, MD [facility]  If 7PM-7AM, please contact night-coverage [email or web address] 10/25/2020, 2:53 PM

## 2020-10-26 DIAGNOSIS — U071 COVID-19: Secondary | ICD-10-CM | POA: Diagnosis not present

## 2020-10-26 DIAGNOSIS — J441 Chronic obstructive pulmonary disease with (acute) exacerbation: Secondary | ICD-10-CM | POA: Diagnosis not present

## 2020-10-26 DIAGNOSIS — N184 Chronic kidney disease, stage 4 (severe): Secondary | ICD-10-CM | POA: Diagnosis not present

## 2020-10-26 DIAGNOSIS — J9601 Acute respiratory failure with hypoxia: Secondary | ICD-10-CM | POA: Diagnosis not present

## 2020-10-26 LAB — COMPREHENSIVE METABOLIC PANEL
ALT: 71 U/L — ABNORMAL HIGH (ref 0–44)
AST: 51 U/L — ABNORMAL HIGH (ref 15–41)
Albumin: 2.7 g/dL — ABNORMAL LOW (ref 3.5–5.0)
Alkaline Phosphatase: 36 U/L — ABNORMAL LOW (ref 38–126)
Anion gap: 9 (ref 5–15)
BUN: 94 mg/dL — ABNORMAL HIGH (ref 8–23)
CO2: 28 mmol/L (ref 22–32)
Calcium: 8.8 mg/dL — ABNORMAL LOW (ref 8.9–10.3)
Chloride: 119 mmol/L — ABNORMAL HIGH (ref 98–111)
Creatinine, Ser: 2.63 mg/dL — ABNORMAL HIGH (ref 0.61–1.24)
GFR, Estimated: 25 mL/min — ABNORMAL LOW (ref >60)
Glucose, Bld: 123 mg/dL — ABNORMAL HIGH (ref 70–99)
Potassium: 5.7 mmol/L — ABNORMAL HIGH (ref 3.5–5.1)
Sodium: 156 mmol/L — ABNORMAL HIGH (ref 135–145)
Total Bilirubin: 0.9 mg/dL (ref 0.3–1.2)
Total Protein: 6.8 g/dL (ref 6.5–8.1)

## 2020-10-26 LAB — CBC WITH DIFFERENTIAL/PLATELET
Abs Immature Granulocytes: 0.08 10*3/uL — ABNORMAL HIGH (ref 0.00–0.07)
Basophils Absolute: 0 10*3/uL (ref 0.0–0.1)
Basophils Relative: 0 %
Eosinophils Absolute: 0 10*3/uL (ref 0.0–0.5)
Eosinophils Relative: 0 %
HCT: 37.7 % — ABNORMAL LOW (ref 39.0–52.0)
Hemoglobin: 10.9 g/dL — ABNORMAL LOW (ref 13.0–17.0)
Immature Granulocytes: 1 %
Lymphocytes Relative: 5 %
Lymphs Abs: 0.6 10*3/uL — ABNORMAL LOW (ref 0.7–4.0)
MCH: 29.7 pg (ref 26.0–34.0)
MCHC: 28.9 g/dL — ABNORMAL LOW (ref 30.0–36.0)
MCV: 102.7 fL — ABNORMAL HIGH (ref 80.0–100.0)
Monocytes Absolute: 0.9 10*3/uL (ref 0.1–1.0)
Monocytes Relative: 8 %
Neutro Abs: 9.7 10*3/uL — ABNORMAL HIGH (ref 1.7–7.7)
Neutrophils Relative %: 86 %
Platelets: 270 10*3/uL (ref 150–400)
RBC: 3.67 MIL/uL — ABNORMAL LOW (ref 4.22–5.81)
RDW: 13.6 % (ref 11.5–15.5)
WBC: 11.4 10*3/uL — ABNORMAL HIGH (ref 4.0–10.5)
nRBC: 0 % (ref 0.0–0.2)

## 2020-10-26 LAB — BASIC METABOLIC PANEL
Anion gap: 10 (ref 5–15)
BUN: 100 mg/dL — ABNORMAL HIGH (ref 8–23)
CO2: 30 mmol/L (ref 22–32)
Calcium: 9 mg/dL (ref 8.9–10.3)
Chloride: 117 mmol/L — ABNORMAL HIGH (ref 98–111)
Creatinine, Ser: 2.78 mg/dL — ABNORMAL HIGH (ref 0.61–1.24)
GFR, Estimated: 23 mL/min — ABNORMAL LOW (ref >60)
Glucose, Bld: 162 mg/dL — ABNORMAL HIGH (ref 70–99)
Potassium: 5.6 mmol/L — ABNORMAL HIGH (ref 3.5–5.1)
Sodium: 157 mmol/L — ABNORMAL HIGH (ref 135–145)

## 2020-10-26 LAB — D-DIMER, QUANTITATIVE: D-Dimer, Quant: 1.16 ug/mL-FEU — ABNORMAL HIGH (ref 0.00–0.50)

## 2020-10-26 LAB — MAGNESIUM: Magnesium: 3.1 mg/dL — ABNORMAL HIGH (ref 1.7–2.4)

## 2020-10-26 LAB — C-REACTIVE PROTEIN: CRP: 10.6 mg/dL — ABNORMAL HIGH (ref <1.0)

## 2020-10-26 LAB — FERRITIN: Ferritin: 616 ng/mL — ABNORMAL HIGH (ref 24–336)

## 2020-10-26 LAB — PHOSPHORUS: Phosphorus: 5.6 mg/dL — ABNORMAL HIGH (ref 2.5–4.6)

## 2020-10-26 MED ORDER — SODIUM CHLORIDE 0.9 % IV SOLN
2.0000 g | INTRAVENOUS | Status: DC
  Administered 2020-10-26 – 2020-10-28 (×3): 2 g via INTRAVENOUS
  Filled 2020-10-26 (×3): qty 20

## 2020-10-26 MED ORDER — BISACODYL 10 MG RE SUPP
10.0000 mg | Freq: Every day | RECTAL | Status: DC | PRN
  Administered 2020-10-26: 10 mg via RECTAL
  Filled 2020-10-26: qty 1

## 2020-10-26 MED ORDER — CALCIUM GLUCONATE-NACL 1-0.675 GM/50ML-% IV SOLN
1.0000 g | Freq: Once | INTRAVENOUS | Status: AC
  Administered 2020-10-26: 1000 mg via INTRAVENOUS
  Filled 2020-10-26: qty 50

## 2020-10-26 MED ORDER — DEXTROSE 5 % IV SOLN: INTRAVENOUS | Status: DC

## 2020-10-26 MED ORDER — DEXAMETHASONE SODIUM PHOSPHATE 10 MG/ML IJ SOLN
6.0000 mg | INTRAMUSCULAR | Status: DC
  Administered 2020-10-27 – 2020-10-31 (×5): 6 mg via INTRAVENOUS
  Filled 2020-10-26 (×5): qty 1

## 2020-10-26 MED ORDER — HEPARIN SODIUM (PORCINE) 10000 UNIT/ML IJ SOLN
10000.0000 [IU] | Freq: Three times a day (TID) | INTRAMUSCULAR | Status: DC
  Administered 2020-10-26 – 2020-10-28 (×5): 10000 [IU] via SUBCUTANEOUS
  Filled 2020-10-26 (×13): qty 1

## 2020-10-26 NOTE — Progress Notes (Signed)
Lab called regarding a gram positive blood culture. Provider made aware and orders placed.

## 2020-10-26 NOTE — Progress Notes (Signed)
Poor PO intake and delayed swallowing noted. MD made aware, SLP / dietician orders received.

## 2020-10-26 NOTE — Progress Notes (Signed)
PROGRESS NOTE    DOMANICK CUCCIA  ZOX:[id number] DOB: 1946-06-12 DOA: 10/21/2020 PCP: Rosita Fire, MD   Brief Narrative:  Darren Burke is a 75 y.o. male with medical history significant for longtime tobacco use, COPD (emphysema), GERD, stage IV CKD, chronic constipation, hypertension, paroxysmal atrial flutter who presented to the emergency department from PCP office with shortness of breath and hypoxia.  He was noted to have a pulse ox of 80 to 81% on room air.  Patient was admitted with acute hypoxemic respiratory failure secondary to Covid pneumonia and is unvaccinated.  He and his family were unsure of starting remdesivir, but would now like to start this medication and therefore it will be initiated 12/28.  His hypoxemia is worsening and is currently at 10 L nasal cannula.  Assessment & Plan:   Principal Problem:   Pneumonia due to COVID-19 virus Active Problems:   Pulmonary emphysema ([place])   COPD with acute exacerbation (HCC)   SOB (shortness of breath)   Hypoxia   Acute and chronic respiratory failure with hypoxia (HCC)   HTN (hypertension)   Paroxysmal atrial flutter (HCC)   Anemia   CKD (chronic kidney disease), stage IV (HCC)   Transaminitis   1. Acute respiratory failure with hypoxia secondary to Covid pneumonia-patient is unvaccinated.  He has underlying chronic lung disease and other co-morbidities.  He remains High Risk for adverse outcome and severe lung parenchymal injury. He and his family initially refused the use of remdesivir; then on 10/22/2020 accepted to be treated along with the use of steroids.  Patient with good response to the initiation of barcitinib and currently down to just 2 L high flow nasal cannula supplementation.  Will continue current management, supportive care and continue to wean off supplementation as tolerated.  Has completed remdesivir infusion today.  2. Chronic stage IV kidney disease -his renal function is stable overall and will  continue following trend. Continue minimizing nephrotoxic agents and avoiding hypotension.  3. COPD with acute exacerbation secondary to Covid infection treating with IV steroids and bronchodilators as mentioned above. 4. Anemia in CKD-follow CBC closely; no signs of overt bleeding appreciated. 5. Paroxysmal atrial flutter-currently in sinus rhythm.  Rate controlled; no chronically on anticoagulation.  Continue aspirin. 6. Essential hypertension -continue Coreg; blood pressure stable. 7. Chronic constipation - laxative therapy ordered.  Follow response and further improve bowel management as required. 8. Transaminitis - likely reactive from current Covid infection.    Improving.  Continue to follow stability. 9. Metabolic encephalopathy/hospital-acquired delirium: continue BID Seroquel and constant reorientation. Will fix electrolytes, as hypernatremia seen on today's lab could be also playing role on his disorientation.   10. Hypernatremia/hyperkalemia: continue D5W and follow sodium trend. Calcium gluconate given; no telemetry changes appreciated. If potassium higher than 5.8 on repeat labs will give lokelma.   DVT prophylaxis: Lovenox Code Status: Full Family Communication: Discussed with daughter and son over the phone on  12/29 Disposition Plan:  Status is: Inpatient  Remains inpatient appropriate because:IV treatments appropriate due to intensity of illness or inability to take PO and Inpatient level of care appropriate due to severity of illness   Dispo: The patient is from: Home              Anticipated d/c is to: Home              Anticipated d/c date is: 3 days or so.              Patient  currently is not medically stable to d/c.  Patient will be continued on steroids and Barcitinib. Has completed Remdesivir infusion today. Will provide gentle fluid resuscitation to assist with hypernatremia and continue the use of seroquel.   Consultants:   None  Procedures:    None  Antimicrobials:  Anti-infectives (From admission, onward)   Start     Dose/Rate Route Frequency Ordered Stop   10/23/20 1000  remdesivir 100 mg in sodium chloride 0.9 % 100 mL IVPB       "Followed by" Linked Group Details   100 mg 200 mL/hr over 30 Minutes Intravenous Daily 10/22/20 1154 10/26/20 0933   10/22/20 1200  remdesivir 100 mg in sodium chloride 0.9 % 100 mL IVPB       "Followed by" Linked Group Details   100 mg 200 mL/hr over 30 Minutes Intravenous Every 30 min 10/22/20 1154 10/22/20 1436   10/22/20 1000  remdesivir 100 mg in sodium chloride 0.9 % 100 mL IVPB  Status:  Discontinued        100 mg 200 mL/hr over 30 Minutes Intravenous Daily 10/21/20 1353 10/21/20 1418   10/21/20 1430  remdesivir 100 mg in sodium chloride 0.9 % 100 mL IVPB  Status:  Discontinued        100 mg 200 mL/hr over 30 Minutes Intravenous Every 1 hr x 2 10/21/20 1353 10/21/20 1418      Subjective: No fever, no nausea, no vomiting. Using 2L [place] supplementation. Still confused and having difficulties been redirected.  Objective: Vitals:   10/26/20 0752 10/26/20 0800 10/26/20 0900 10/26/20 0905  BP:  101/66 90/69 (!) 126/48  Pulse: 92 87 100 94  Resp: (!) 29 (!) 31 (!) 26 (!) 35  Temp: 98.3 F (36.8 C)     TempSrc: Axillary     SpO2: 95% 98% 94% 96%  Weight:      Height:        Intake/Output Summary (Last 24 hours) at 10/26/2020 1046 Last data filed at 10/25/2020 1227 Gross per 24 hour  Intake 100 ml  Output --  Net 100 ml   Filed Weights   10/21/20 1155  Weight: 101.2 kg    Examination: General exam: Alert, awake, oriented x 1; still confused and having difficulties been redirected/oriented.  Respiratory system: Cno wheezing, no crackles, positive rhonchi bilaterally. No using accessory muscles. Cardiovascular system:RRR. No murmurs, rubs, gallops. Gastrointestinal system: Abdomen is nondistended, soft and nontender. No organomegaly or masses felt. Normal bowel sounds  heard. Central nervous system: No focal neurological deficits. Extremities: No Cyanosis, no clubbing.  Skin: No rashes, no petechiae. Psychiatry: Judgement and insight appear impaired with acute encephalopathy.   Data Reviewed: I have personally reviewed following labs and imaging studies  CBC: Recent Labs  Lab 10/22/20 0435 10/23/20 0518 10/24/20 0603 10/25/20 0646 10/26/20 0537  WBC 5.2 8.4 10.2 11.5* 11.4*  NEUTROABS 4.5 7.3 9.0* 9.8* 9.7*  HGB 10.6* 10.8* 11.2* 10.7* 10.9*  HCT 34.4* 36.1* 36.8* 35.5* 37.7*  MCV 97.5 98.4 98.4 100.3* 102.7*  PLT 237 277 321 303 568   Basic Metabolic Panel: Recent Labs  Lab 10/22/20 0435 10/23/20 0518 10/24/20 0603 10/25/20 0646 10/26/20 0429  NA 141 144 148* 151* 156*  K 5.0 4.5 4.7 5.2* 5.7*  CL 108 109 112* 115* 119*  CO2 22 25 25 28 28   GLUCOSE 147* 141* 143* 124* 123*  BUN 67* 67* 72* 88* 94*  CREATININE 2.21* 2.10* 1.97* 2.49* 2.63*  CALCIUM 8.0* 8.3* 8.4*  8.6* 8.8*  MG 2.7* 2.6* 2.8* 3.1* 3.1*  PHOS 4.9* 4.6 4.2 5.3* 5.6*   GFR: Estimated Creatinine Clearance: 28.8 mL/min (A) (by C-G formula based on SCr of 2.63 mg/dL (H)).   Liver Function Tests: Recent Labs  Lab 10/22/20 0435 10/23/20 0518 10/24/20 0603 10/25/20 0646 10/26/20 0429  AST 89* 64* 52* 57* 51*  ALT 56* 54* 56* 66* 71*  ALKPHOS 36* 37* 35* 36* 36*  BILITOT 1.0 0.8 1.0 1.1 0.9  PROT 6.7 6.7 6.8 6.7 6.8  ALBUMIN 2.5* 2.5* 2.6* 2.7* 2.7*   Lipid Profile: No results for input(s): CHOL, HDL, LDLCALC, TRIG, CHOLHDL, LDLDIRECT in the last 72 hours.   Anemia Panel: Recent Labs    10/25/20 0646 10/26/20 0429  FERRITIN 536* 616*   Sepsis Labs: Recent Labs  Lab 10/21/20 1424  PROCALCITON 0.25  LATICACIDVEN 1.8    Recent Results (from the past 240 hour(s))  Resp Panel by RT-PCR (Flu A&B, Covid) Nasopharyngeal Swab     Status: Abnormal   Collection Time: 10/21/20 12:03 PM   Specimen: Nasopharyngeal Swab; Nasopharyngeal(NP) swabs in vial  transport medium  Result Value Ref Range Status   SARS Coronavirus 2 by RT PCR POSITIVE (A) NEGATIVE Final    Comment: RESULT CALLED TO, READ BACK BY AND VERIFIED WITH: CARDWELL,L AT 1325 BY HUFFINES,S ON 10/21/20 (NOTE) SARS-CoV-2 target nucleic acids are DETECTED.  The SARS-CoV-2 RNA is generally detectable in upper respiratory specimens during the acute phase of infection. Positive results are indicative of the presence of the identified virus, but do not rule out bacterial infection or co-infection with other pathogens not detected by the test. Clinical correlation with patient history and other diagnostic information is necessary to determine patient infection status. The expected result is Negative.  Fact Sheet for Patients: EntrepreneurPulse.com.au  Fact Sheet for Healthcare Providers: IncredibleEmployment.be  This test is not yet approved or cleared by the [place] FDA and  has been authorized for detection and/or diagnosis of SARS-CoV-2 by FDA under an Emergency Use Authorization (EUA).  This EUA will remain in effect (meaning this  test can be used) for the duration of  the COVID-19 declaration under Section 564(b)(1) of the Act, 21 U.S.C. section 360bbb-3(b)(1), unless the authorization is terminated or revoked sooner.     Influenza A by PCR NEGATIVE NEGATIVE Final   Influenza B by PCR NEGATIVE NEGATIVE Final    Comment: (NOTE) The Xpert Xpress SARS-CoV-2/FLU/RSV plus assay is intended as an aid in the diagnosis of influenza from Nasopharyngeal swab specimens and should not be used as a sole basis for treatment. Nasal washings and aspirates are unacceptable for Xpert Xpress SARS-CoV-2/FLU/RSV testing.  Fact Sheet for Patients: EntrepreneurPulse.com.au  Fact Sheet for Healthcare Providers: IncredibleEmployment.be  This test is not yet approved or cleared by the [place] FDA and has  been authorized for detection and/or diagnosis of SARS-CoV-2 by FDA under an Emergency Use Authorization (EUA). This EUA will remain in effect (meaning this test can be used) for the duration of the COVID-19 declaration under Section 564(b)(1) of the Act, 21 U.S.C. section 360bbb-3(b)(1), unless the authorization is terminated or revoked.  Performed at [facility], [address] [place], [place] [address]   Blood Culture (routine x 2)     Status: Abnormal   Collection Time: 10/21/20  2:24 PM   Specimen: BLOOD  Result Value Ref Range Status   Specimen Description   Final    BLOOD RIGHT ANTECUBITAL Performed at [facility],  [address], [place], [place] [address]    Special Requests   Final    BOTTLES DRAWN AEROBIC AND ANAEROBIC Blood Culture adequate volume Performed at [facility], [address], [place], [place] [address]    Culture  Setup Time   Final    AEROBIC BOTTLE ONLY GRAM POSITIVE COCCI Gram Stain Report Called to,Read Back By and Verified With: S BOLES,[email or web address]@[id number]  10/22/20 MKELLY Organism ID to follow    Culture (A)  Final    MICROCOCCUS SPECIES Standardized susceptibility testing for this organism is not available. Performed at [facility], [place] [address], [place], [place] [address]    Report Status 10/24/2020 FINAL  Final  Blood Culture ID Panel (Reflexed)     Status: None   Collection Time: 10/21/20  2:24 PM  Result Value Ref Range Status   Enterococcus faecalis NOT DETECTED NOT DETECTED Final   Enterococcus Faecium NOT DETECTED NOT DETECTED Final   Listeria monocytogenes NOT DETECTED NOT DETECTED Final   Staphylococcus species NOT DETECTED NOT DETECTED Final   Staphylococcus aureus (BCID) NOT DETECTED NOT DETECTED Final   Staphylococcus epidermidis NOT DETECTED NOT DETECTED Final   Staphylococcus lugdunensis NOT DETECTED NOT DETECTED Final   Streptococcus species NOT DETECTED NOT DETECTED Final   Streptococcus agalactiae NOT DETECTED NOT DETECTED  Final   Streptococcus pneumoniae NOT DETECTED NOT DETECTED Final   Streptococcus pyogenes NOT DETECTED NOT DETECTED Final   A.calcoaceticus-baumannii NOT DETECTED NOT DETECTED Final   Bacteroides fragilis NOT DETECTED NOT DETECTED Final   Enterobacterales NOT DETECTED NOT DETECTED Final   Enterobacter cloacae complex NOT DETECTED NOT DETECTED Final   Escherichia coli NOT DETECTED NOT DETECTED Final   Klebsiella aerogenes NOT DETECTED NOT DETECTED Final   Klebsiella oxytoca NOT DETECTED NOT DETECTED Final   Klebsiella pneumoniae NOT DETECTED NOT DETECTED Final   Proteus species NOT DETECTED NOT DETECTED Final   Salmonella species NOT DETECTED NOT DETECTED Final   Serratia marcescens NOT DETECTED NOT DETECTED Final   Haemophilus influenzae NOT DETECTED NOT DETECTED Final   Neisseria meningitidis NOT DETECTED NOT DETECTED Final   Pseudomonas aeruginosa NOT DETECTED NOT DETECTED Final   Stenotrophomonas maltophilia NOT DETECTED NOT DETECTED Final   Candida albicans NOT DETECTED NOT DETECTED Final   Candida auris NOT DETECTED NOT DETECTED Final   Candida glabrata NOT DETECTED NOT DETECTED Final   Candida krusei NOT DETECTED NOT DETECTED Final   Candida parapsilosis NOT DETECTED NOT DETECTED Final   Candida tropicalis NOT DETECTED NOT DETECTED Final   Cryptococcus neoformans/gattii NOT DETECTED NOT DETECTED Final    Comment: Performed at [facility], [address] [place], [place] [address]  Blood Culture (routine x 2)     Status: None   Collection Time: 10/21/20  3:25 PM   Specimen: BLOOD  Result Value Ref Range Status   Specimen Description BLOOD LEFT ANTECUBITAL  Final   Special Requests   Final    BOTTLES DRAWN AEROBIC AND ANAEROBIC Blood Culture adequate volume   Culture   Final    NO GROWTH 5 DAYS Performed at [facility], [address] [place],  [address]    Report Status 10/26/2020 FINAL  Final  MRSA PCR Screening     Status: None   Collection Time: 10/23/20  10:15 AM   Specimen: Nasopharyngeal  Result Value Ref Range Status   MRSA by PCR NEGATIVE NEGATIVE Final    Comment:        The GeneXpert MRSA Assay (  FDA approved for NASAL specimens only), is one component of a comprehensive MRSA colonization surveillance program. It is not intended to diagnose MRSA infection nor to guide or monitor treatment for MRSA infections. Performed at [facility], [address] [place], [place] [address]      Radiology Studies: DG CHEST PORT 1 VIEW  Result Date: 10/25/2020 CLINICAL DATA:  Worsening shortness of breath today. Positive COVID test on 10/21/2020. Former smoker with COPD. EXAM: PORTABLE CHEST 1 VIEW COMPARISON:  10/22/2020 and older studies. FINDINGS: Cardiac silhouette is normal in size. No mediastinal or hilar masses. Bilateral interstitial thickening noted most evident at the bases, right greater than left, similar to the prior exam. No lung consolidation to suggest pneumonia. No pleural effusion or pneumothorax. Skeletal structures are grossly intact. IMPRESSION: 1. No convincing acute cardiopulmonary disease. 2. Bilateral interstitial thickening, greatest at the right lung base, consistent with fibrosis. Electronically Signed   By: Lajean Manes M.D.   On: 10/25/2020 14:01    Scheduled Meds: . vitamin C  500 mg Oral Daily  . aspirin EC  81 mg Oral Daily  . baricitinib  2 mg Oral Daily  . carvedilol  3.125 mg Oral BID WC  . Chlorhexidine Gluconate Cloth  6 each Topical Daily  . dexamethasone  6 mg Oral Daily  . heparin injection (subcutaneous)  10,000 Units Subcutaneous Q8H  . pantoprazole  40 mg Oral Daily  . QUEtiapine  12.5 mg Oral BID  . zinc sulfate  220 mg Oral Daily   Continuous Infusions: . calcium gluconate 1,000 mg (10/26/20 0946)  . dextrose 50 mL/hr at 10/26/20 0850     LOS: 5 days    Time spent: 35 minutes    Barton Dubois, MD [facility]  If 7PM-7AM, please contact  night-coverage [email or web address] 10/26/2020, 10:46 AM

## 2020-10-27 DIAGNOSIS — U071 COVID-19: Secondary | ICD-10-CM | POA: Diagnosis not present

## 2020-10-27 DIAGNOSIS — J1282 Pneumonia due to coronavirus disease 2019: Secondary | ICD-10-CM | POA: Diagnosis not present

## 2020-10-27 LAB — BASIC METABOLIC PANEL
Anion gap: 7 (ref 5–15)
BUN: 109 mg/dL — ABNORMAL HIGH (ref 8–23)
CO2: 28 mmol/L (ref 22–32)
Calcium: 8.6 mg/dL — ABNORMAL LOW (ref 8.9–10.3)
Chloride: 120 mmol/L — ABNORMAL HIGH (ref 98–111)
Creatinine, Ser: 2.95 mg/dL — ABNORMAL HIGH (ref 0.61–1.24)
GFR, Estimated: 22 mL/min — ABNORMAL LOW (ref >60)
Glucose, Bld: 145 mg/dL — ABNORMAL HIGH (ref 70–99)
Potassium: 5.7 mmol/L — ABNORMAL HIGH (ref 3.5–5.1)
Sodium: 155 mmol/L — ABNORMAL HIGH (ref 135–145)

## 2020-10-27 LAB — GLUCOSE, CAPILLARY: Glucose-Capillary: 136 mg/dL — ABNORMAL HIGH (ref 70–99)

## 2020-10-27 LAB — AMMONIA: Ammonia: 40 umol/L — ABNORMAL HIGH (ref 9–35)

## 2020-10-27 MED ORDER — LORAZEPAM 2 MG/ML IJ SOLN
1.0000 mg | Freq: Three times a day (TID) | INTRAMUSCULAR | Status: DC
  Administered 2020-10-27 – 2020-10-29 (×2): 1 mg via INTRAVENOUS
  Filled 2020-10-27 (×2): qty 1

## 2020-10-27 NOTE — Progress Notes (Addendum)
PROGRESS NOTE    Darren Burke  DXA:[id number] DOB: 1945-11-11 DOA: 10/21/2020 PCP: Rosita Fire, MD   Brief Narrative:  Darren Burke a 75 y.o.malewith medical history significantfor longtimetobacco use, COPD(emphysema),GERD,stage IV CKD, chronic constipation, hypertension, paroxysmal atrial flutter who presented to the emergency department from PCP office with shortness of breath and hypoxia. He was noted to have a pulse ox of 80 to 81% on room air.  Patient was admitted with acute hypoxemic respiratory failure secondary to Covid pneumonia and is unvaccinated.  He and his family were unsure of starting remdesivir, but would now like to start this medication and therefore it will be initiated 12/28.  His hypoxemia is currently improved to 1 L nasal cannula oxygen.  He is having worsening electrolyte abnormalities and appears to be catatonic on 1/2.  Assessment & Plan:   Principal Problem:   Pneumonia due to COVID-19 virus Active Problems:   Pulmonary emphysema ([place])   COPD with acute exacerbation (HCC)   SOB (shortness of breath)   Hypoxia   Acute and chronic respiratory failure with hypoxia (HCC)   HTN (hypertension)   Paroxysmal atrial flutter (HCC)   Anemia   CKD (chronic kidney disease), stage IV (HCC)   Transaminitis  1. Acute respiratory failure with hypoxia secondary to Covid pneumonia-patient is unvaccinated. He has underlying chronic lung disease and other co-morbidities. He remains HighRisk for adverse outcomeandseverelungparenchymal injury. He and his family initially refused the use of remdesivir; then on 10/22/2020 accepted to be treated along with the use of steroids.  Patient with good response to the initiation of barcitinib and currently down to just 1 L high flow nasal cannula supplementation.  Will continue current management, supportive care and continue to wean off supplementation as tolerated.  Has completed remdesivir infusion. 2. Catatonic  state versus acute metabolic encephalopathy.  Notably started on Seroquel for hospital-acquired delirium which may be contributing to this process versus significant electrolyte abnormalities.  Plan to recheck electrolytes today and make adjustments as needed and start on lorazepam 1 mg IV 3 times daily and hold further Seroquel. 3. Chronic stage IV kidney disease -his renal function is stable overall and will continue following trend. Continue minimizing nephrotoxic agents and avoiding hypotension.  4. COPD with acute exacerbation secondary to Covid infection treating with IV steroids and bronchodilators as mentioned above. 5. Anemia in CKD-follow CBC closely; no signs of overt bleeding appreciated. 6. Paroxysmal atrial flutter-currently in sinus rhythm.  Rate controlled; no chronically on anticoagulation.  Continue aspirin. 7. Essential hypertension -continue Coreg; blood pressure stable. 8. Chronic constipation - laxative therapy ordered.  Follow response and further improve bowel management as required. 9. Transaminitis - likely reactive from current Covid infection.   Improving.  Continue to follow stability with repeat labs in a.m.   10. Hypernatremia/hyperkalemia: continue D5W and follow sodium trend. Calcium gluconate given; no telemetry changes appreciated. If potassium higher than 5.8 on repeat labs will give lokelma.   DVT prophylaxis: Lovenox Code Status: Full Family Communication: Discussed with daughter over the phone on  1/2 Disposition Plan:  Status is: Inpatient  Remains inpatient appropriate because:IV treatments appropriate due to intensity of illness or inability to take PO and Inpatient level of care appropriate due to severity of illness   Dispo: The patient is from: Home  Anticipated d/c is to: Home  Anticipated d/c date is: 3 days or so.  Patient currently is not medically stable to d/c.  Patient will be continued on steroids and  Barcitinib. Has completed Remdesivir infusion. Will provide gentle fluid resuscitation to assist with hypernatremia and discontinue use of Seroquel.   Consultants:   None  Procedures:   None  Antimicrobials:  Anti-infectives (From admission, onward)   Start     Dose/Rate Route Frequency Ordered Stop   10/26/20 1730  cefTRIAXone (ROCEPHIN) 2 g in sodium chloride 0.9 % 100 mL IVPB        2 g 200 mL/hr over 30 Minutes Intravenous Every 24 hours 10/26/20 1636     10/23/20 1000  remdesivir 100 mg in sodium chloride 0.9 % 100 mL IVPB       "Followed by" Linked Group Details   100 mg 200 mL/hr over 30 Minutes Intravenous Daily 10/22/20 1154 10/26/20 1128   10/22/20 1200  remdesivir 100 mg in sodium chloride 0.9 % 100 mL IVPB       "Followed by" Linked Group Details   100 mg 200 mL/hr over 30 Minutes Intravenous Every 30 min 10/22/20 1154 10/22/20 1436   10/22/20 1000  remdesivir 100 mg in sodium chloride 0.9 % 100 mL IVPB  Status:  Discontinued        100 mg 200 mL/hr over 30 Minutes Intravenous Daily 10/21/20 1353 10/21/20 1418   10/21/20 1430  remdesivir 100 mg in sodium chloride 0.9 % 100 mL IVPB  Status:  Discontinued        100 mg 200 mL/hr over 30 Minutes Intravenous Every 1 hr x 2 10/21/20 1353 10/21/20 1418       Subjective: Patient seen and evaluated today and appears to be in a catatonic state since earlier this morning.  He is unresponsive to touch no verbal cues.  Objective: Vitals:   10/27/20 0300 10/27/20 0400 10/27/20 0753 10/27/20 1151  BP: 96/73 125/71 91/63   Pulse: 85 80 73   Resp: (!) 32 (!) 33 (!) 23   Temp:   99.1 F (37.3 C) 98.6 F (37 C)  TempSrc:   Axillary Axillary  SpO2: 93% 93% 95%   Weight:      Height:        Intake/Output Summary (Last 24 hours) at 10/27/2020 1206 Last data filed at 10/27/2020 0747 Gross per 24 hour  Intake 1296.59 ml  Output 1000 ml  Net 296.59 ml   Filed Weights   10/21/20 1155  Weight: 101.2 kg     Examination:  General exam: Appears unresponsive Respiratory system: Clear to auscultation. Respiratory effort normal.  On 1 L nasal cannula Cardiovascular system: S1 & S2 heard, RRR.  Gastrointestinal system: Abdomen is soft Central nervous system: Alert but unresponsive Extremities: No edema Skin: No significant lesions noted Psychiatry: Flat affect.    Data Reviewed: I have personally reviewed following labs and imaging studies  CBC: Recent Labs  Lab 10/22/20 0435 10/23/20 0518 10/24/20 0603 10/25/20 0646 10/26/20 0537  WBC 5.2 8.4 10.2 11.5* 11.4*  NEUTROABS 4.5 7.3 9.0* 9.8* 9.7*  HGB 10.6* 10.8* 11.2* 10.7* 10.9*  HCT 34.4* 36.1* 36.8* 35.5* 37.7*  MCV 97.5 98.4 98.4 100.3* 102.7*  PLT 237 277 321 303 170   Basic Metabolic Panel: Recent Labs  Lab 10/22/20 0435 10/23/20 0518 10/24/20 0603 10/25/20 0646 10/26/20 0429 10/26/20 1316  NA 141 144 148* 151* 156* 157*  K 5.0 4.5 4.7 5.2* 5.7* 5.6*  CL 108 109 112* 115* 119* 117*  CO2 22 25 25 28 28 30   GLUCOSE 147* 141* 143* 124* 123* 162*  BUN 67* 67* 72* 88* 94*  100*  CREATININE 2.21* 2.10* 1.97* 2.49* 2.63* 2.78*  CALCIUM 8.0* 8.3* 8.4* 8.6* 8.8* 9.0  MG 2.7* 2.6* 2.8* 3.1* 3.1*  --   PHOS 4.9* 4.6 4.2 5.3* 5.6*  --    GFR: Estimated Creatinine Clearance: 27.2 mL/min (A) (by C-G formula based on SCr of 2.78 mg/dL (H)). Liver Function Tests: Recent Labs  Lab 10/22/20 0435 10/23/20 0518 10/24/20 0603 10/25/20 0646 10/26/20 0429  AST 89* 64* 52* 57* 51*  ALT 56* 54* 56* 66* 71*  ALKPHOS 36* 37* 35* 36* 36*  BILITOT 1.0 0.8 1.0 1.1 0.9  PROT 6.7 6.7 6.8 6.7 6.8  ALBUMIN 2.5* 2.5* 2.6* 2.7* 2.7*   No results for input(s): LIPASE, AMYLASE in the last 168 hours. Recent Labs  Lab 10/27/20 0511  AMMONIA 40*   Coagulation Profile: No results for input(s): INR, PROTIME in the last 168 hours. Cardiac Enzymes: No results for input(s): CKTOTAL, CKMB, CKMBINDEX, TROPONINI in the last 168 hours. BNP  (last 3 results) No results for input(s): PROBNP in the last 8760 hours. HbA1C: No results for input(s): HGBA1C in the last 72 hours. CBG: Recent Labs  Lab 10/27/20 0826  GLUCAP 136*   Lipid Profile: No results for input(s): CHOL, HDL, LDLCALC, TRIG, CHOLHDL, LDLDIRECT in the last 72 hours. Thyroid Function Tests: No results for input(s): TSH, T4TOTAL, FREET4, T3FREE, THYROIDAB in the last 72 hours. Anemia Panel: Recent Labs    10/25/20 0646 10/26/20 0429  FERRITIN 536* 616*   Sepsis Labs: Recent Labs  Lab 10/21/20 1424  PROCALCITON 0.25  LATICACIDVEN 1.8    Recent Results (from the past 240 hour(s))  Resp Panel by RT-PCR (Flu A&B, Covid) Nasopharyngeal Swab     Status: Abnormal   Collection Time: 10/21/20 12:03 PM   Specimen: Nasopharyngeal Swab; Nasopharyngeal(NP) swabs in vial transport medium  Result Value Ref Range Status   SARS Coronavirus 2 by RT PCR POSITIVE (A) NEGATIVE Final    Comment: RESULT CALLED TO, READ BACK BY AND VERIFIED WITH: CARDWELL,L AT 1325 BY HUFFINES,S ON 10/21/20 (NOTE) SARS-CoV-2 target nucleic acids are DETECTED.  The SARS-CoV-2 RNA is generally detectable in upper respiratory specimens during the acute phase of infection. Positive results are indicative of the presence of the identified virus, but do not rule out bacterial infection or co-infection with other pathogens not detected by the test. Clinical correlation with patient history and other diagnostic information is necessary to determine patient infection status. The expected result is Negative.  Fact Sheet for Patients: EntrepreneurPulse.com.au  Fact Sheet for Healthcare Providers: IncredibleEmployment.be  This test is not yet approved or cleared by the [place] FDA and  has been authorized for detection and/or diagnosis of SARS-CoV-2 by FDA under an Emergency Use Authorization (EUA).  This EUA will remain in effect (meaning this   test can be used) for the duration of  the COVID-19 declaration under Section 564(b)(1) of the Act, 21 U.S.C. section 360bbb-3(b)(1), unless the authorization is terminated or revoked sooner.     Influenza A by PCR NEGATIVE NEGATIVE Final   Influenza B by PCR NEGATIVE NEGATIVE Final    Comment: (NOTE) The Xpert Xpress SARS-CoV-2/FLU/RSV plus assay is intended as an aid in the diagnosis of influenza from Nasopharyngeal swab specimens and should not be used as a sole basis for treatment. Nasal washings and aspirates are unacceptable for Xpert Xpress SARS-CoV-2/FLU/RSV testing.  Fact Sheet for Patients: EntrepreneurPulse.com.au  Fact Sheet for Healthcare Providers: IncredibleEmployment.be  This test is not yet approved or  cleared by the [place] and has been authorized for detection and/or diagnosis of SARS-CoV-2 by FDA under an Emergency Use Authorization (EUA). This EUA will remain in effect (meaning this test can be used) for the duration of the COVID-19 declaration under Section 564(b)(1) of the Act, 21 U.S.C. section 360bbb-3(b)(1), unless the authorization is terminated or revoked.  Performed at [facility], [address] [place], [place] [address]   Blood Culture (routine x 2)     Status: Abnormal   Collection Time: 10/21/20  2:24 PM   Specimen: BLOOD  Result Value Ref Range Status   Specimen Description   Final    BLOOD RIGHT ANTECUBITAL Performed at [facility], [address] [place], [place] [address]    Special Requests   Final    BOTTLES DRAWN AEROBIC AND ANAEROBIC Blood Culture adequate volume Performed at [facility], [address], [place], [place] [address]    Culture  Setup Time   Final    AEROBIC BOTTLE ONLY GRAM POSITIVE COCCI Gram Stain Report Called to,Read Back By and Verified With: S BOLES,RN@2119  10/22/20 MKELLY Organism ID to follow    Culture (A)  Final    MICROCOCCUS SPECIES Standardized  susceptibility testing for this organism is not available. Performed at [facility], [place] [address], [place], [place] [address]    Report Status 10/24/2020 FINAL  Final  Blood Culture ID Panel (Reflexed)     Status: None   Collection Time: 10/21/20  2:24 PM  Result Value Ref Range Status   Enterococcus faecalis NOT DETECTED NOT DETECTED Final   Enterococcus Faecium NOT DETECTED NOT DETECTED Final   Listeria monocytogenes NOT DETECTED NOT DETECTED Final   Staphylococcus species NOT DETECTED NOT DETECTED Final   Staphylococcus aureus (BCID) NOT DETECTED NOT DETECTED Final   Staphylococcus epidermidis NOT DETECTED NOT DETECTED Final   Staphylococcus lugdunensis NOT DETECTED NOT DETECTED Final   Streptococcus species NOT DETECTED NOT DETECTED Final   Streptococcus agalactiae NOT DETECTED NOT DETECTED Final   Streptococcus pneumoniae NOT DETECTED NOT DETECTED Final   Streptococcus pyogenes NOT DETECTED NOT DETECTED Final   A.calcoaceticus-baumannii NOT DETECTED NOT DETECTED Final   Bacteroides fragilis NOT DETECTED NOT DETECTED Final   Enterobacterales NOT DETECTED NOT DETECTED Final   Enterobacter cloacae complex NOT DETECTED NOT DETECTED Final   Escherichia coli NOT DETECTED NOT DETECTED Final   Klebsiella aerogenes NOT DETECTED NOT DETECTED Final   Klebsiella oxytoca NOT DETECTED NOT DETECTED Final   Klebsiella pneumoniae NOT DETECTED NOT DETECTED Final   Proteus species NOT DETECTED NOT DETECTED Final   Salmonella species NOT DETECTED NOT DETECTED Final   Serratia marcescens NOT DETECTED NOT DETECTED Final   Haemophilus influenzae NOT DETECTED NOT DETECTED Final   Neisseria meningitidis NOT DETECTED NOT DETECTED Final   Pseudomonas aeruginosa NOT DETECTED NOT DETECTED Final   Stenotrophomonas maltophilia NOT DETECTED NOT DETECTED Final   Candida albicans NOT DETECTED NOT DETECTED Final   Candida auris NOT DETECTED NOT DETECTED Final   Candida glabrata NOT DETECTED NOT  DETECTED Final   Candida krusei NOT DETECTED NOT DETECTED Final   Candida parapsilosis NOT DETECTED NOT DETECTED Final   Candida tropicalis NOT DETECTED NOT DETECTED Final   Cryptococcus neoformans/gattii NOT DETECTED NOT DETECTED Final    Comment: Performed at [facility], [address] [place],  [address]  Blood Culture (routine x 2)     Status: None (Preliminary result)   Collection Time: 10/21/20  3:25 PM   Specimen: BLOOD  Result Value Ref Range Status   Specimen Description   Final    BLOOD LEFT ANTECUBITAL Performed at [facility], [address] [place], [place] [address]    Special Requests   Final    BOTTLES DRAWN AEROBIC AND ANAEROBIC Blood Culture adequate volume Performed at [facility], [address] [place], [place] [address]    Culture  Setup Time   Final    GRAM POSITIVE RODS Gram Stain Report Called to,Read Back By and Verified With: TIFFANY SMITH @1624  10/26/20 BY JONES,T ANAEROBIC BOTTLE ONLY [facility] Performed at [facility], [address], [place], [place] [address]    Culture GRAM POSITIVE RODS  Final   Report Status PENDING  Incomplete  MRSA PCR Screening     Status: None   Collection Time: 10/23/20 10:15 AM   Specimen: Nasopharyngeal  Result Value Ref Range Status   MRSA by PCR NEGATIVE NEGATIVE Final    Comment:        The GeneXpert MRSA Assay (FDA approved for NASAL specimens only), is one component of a comprehensive MRSA colonization surveillance program. It is not intended to diagnose MRSA infection nor to guide or monitor treatment for MRSA infections. Performed at [facility], [address], [place], [place] [address]          Radiology Studies: DG CHEST PORT 1 VIEW  Result Date: 10/25/2020 CLINICAL DATA:  Worsening shortness of breath today. Positive COVID test on 10/21/2020. Former smoker with COPD. EXAM: PORTABLE CHEST 1 VIEW COMPARISON:  10/22/2020 and older studies. FINDINGS: Cardiac silhouette is normal in size.  No mediastinal or hilar masses. Bilateral interstitial thickening noted most evident at the bases, right greater than left, similar to the prior exam. No lung consolidation to suggest pneumonia. No pleural effusion or pneumothorax. Skeletal structures are grossly intact. IMPRESSION: 1. No convincing acute cardiopulmonary disease. 2. Bilateral interstitial thickening, greatest at the right lung base, consistent with fibrosis. Electronically Signed   By: Lajean Manes M.D.   On: 10/25/2020 14:01        Scheduled Meds: . vitamin C  500 mg Oral Daily  . aspirin EC  81 mg Oral Daily  . baricitinib  2 mg Oral Daily  . carvedilol  3.125 mg Oral BID WC  . Chlorhexidine Gluconate Cloth  6 each Topical Daily  . dexamethasone (DECADRON) injection  6 mg Intravenous Q24H  . heparin injection (subcutaneous)  10,000 Units Subcutaneous Q8H  . LORazepam  1 mg Intravenous TID  . pantoprazole  40 mg Oral Daily  . zinc sulfate  220 mg Oral Daily   Continuous Infusions: . cefTRIAXone (ROCEPHIN)  IV Stopped (10/26/20 1751)  . dextrose 50 mL/hr at 10/27/20 0747     LOS: 6 days    Time spent: 35 minutes    Keasha Malkiewicz D Manuella Ghazi, DO [facility]  If 7PM-7AM, please contact night-coverage [email or web address] 10/27/2020, 12:06 PM

## 2020-10-27 NOTE — Progress Notes (Signed)
SLP Cancellation Note  Patient Details Name: Darren Burke MRN: [id number] DOB: 02/05/1946   Cancelled treatment:       Reason Eval/Treat Not Completed: Fatigue/lethargy limiting ability to participate;Patient's level of consciousness. Will re-attempt when Pt is appropriate for BSE.   Thank you, Ravan Schlemmer H. Roddie Mc, CCC-SLP Speech Language Pathologist    Wende Bushy 10/27/2020, 2:04 PM

## 2020-10-28 ENCOUNTER — Inpatient Hospital Stay (HOSPITAL_COMMUNITY): Payer: Medicare Other

## 2020-10-28 DIAGNOSIS — U071 COVID-19: Secondary | ICD-10-CM | POA: Diagnosis not present

## 2020-10-28 DIAGNOSIS — J1282 Pneumonia due to coronavirus disease 2019: Secondary | ICD-10-CM | POA: Diagnosis not present

## 2020-10-28 LAB — GLUCOSE, CAPILLARY
Glucose-Capillary: 115 mg/dL — ABNORMAL HIGH (ref 70–99)
Glucose-Capillary: 123 mg/dL — ABNORMAL HIGH (ref 70–99)
Glucose-Capillary: 134 mg/dL — ABNORMAL HIGH (ref 70–99)
Glucose-Capillary: 144 mg/dL — ABNORMAL HIGH (ref 70–99)
Glucose-Capillary: 152 mg/dL — ABNORMAL HIGH (ref 70–99)
Glucose-Capillary: 158 mg/dL — ABNORMAL HIGH (ref 70–99)

## 2020-10-28 LAB — CBC
HCT: 35.5 % — ABNORMAL LOW (ref 39.0–52.0)
Hemoglobin: 10.1 g/dL — ABNORMAL LOW (ref 13.0–17.0)
MCH: 29.9 pg (ref 26.0–34.0)
MCHC: 28.5 g/dL — ABNORMAL LOW (ref 30.0–36.0)
MCV: 105 fL — ABNORMAL HIGH (ref 80.0–100.0)
Platelets: 225 10*3/uL (ref 150–400)
RBC: 3.38 MIL/uL — ABNORMAL LOW (ref 4.22–5.81)
RDW: 13.8 % (ref 11.5–15.5)
WBC: 11.8 10*3/uL — ABNORMAL HIGH (ref 4.0–10.5)
nRBC: 0 % (ref 0.0–0.2)

## 2020-10-28 LAB — AMMONIA: Ammonia: 46 umol/L — ABNORMAL HIGH (ref 9–35)

## 2020-10-28 LAB — COMPREHENSIVE METABOLIC PANEL
ALT: 65 U/L — ABNORMAL HIGH (ref 0–44)
AST: 35 U/L (ref 15–41)
Albumin: 2.2 g/dL — ABNORMAL LOW (ref 3.5–5.0)
Alkaline Phosphatase: 35 U/L — ABNORMAL LOW (ref 38–126)
Anion gap: 9 (ref 5–15)
BUN: 114 mg/dL — ABNORMAL HIGH (ref 8–23)
CO2: 27 mmol/L (ref 22–32)
Calcium: 8.2 mg/dL — ABNORMAL LOW (ref 8.9–10.3)
Chloride: 121 mmol/L — ABNORMAL HIGH (ref 98–111)
Creatinine, Ser: 2.87 mg/dL — ABNORMAL HIGH (ref 0.61–1.24)
GFR, Estimated: 23 mL/min — ABNORMAL LOW (ref >60)
Glucose, Bld: 163 mg/dL — ABNORMAL HIGH (ref 70–99)
Potassium: 5.6 mmol/L — ABNORMAL HIGH (ref 3.5–5.1)
Sodium: 157 mmol/L — ABNORMAL HIGH (ref 135–145)
Total Bilirubin: 0.8 mg/dL (ref 0.3–1.2)
Total Protein: 6.2 g/dL — ABNORMAL LOW (ref 6.5–8.1)

## 2020-10-28 LAB — PHOSPHORUS
Phosphorus: 3.6 mg/dL (ref 2.5–4.6)
Phosphorus: 4 mg/dL (ref 2.5–4.6)

## 2020-10-28 LAB — CULTURE, BLOOD (ROUTINE X 2): Special Requests: ADEQUATE

## 2020-10-28 LAB — MAGNESIUM: Magnesium: 2.9 mg/dL — ABNORMAL HIGH (ref 1.7–2.4)

## 2020-10-28 MED ORDER — SODIUM CHLORIDE 0.9 % IV BOLUS
250.0000 mL | Freq: Once | INTRAVENOUS | Status: AC
  Administered 2020-10-28: 250 mL via INTRAVENOUS

## 2020-10-28 MED ORDER — VITAL 1.5 CAL PO LIQD: 1000.0000 mL | ORAL | Status: DC

## 2020-10-28 MED ORDER — PROSOURCE TF PO LIQD
90.0000 mL | Freq: Two times a day (BID) | ORAL | Status: DC
  Administered 2020-10-28: 90 mL
  Filled 2020-10-28: qty 90

## 2020-10-28 MED ORDER — VITAL HIGH PROTEIN PO LIQD: 1000.0000 mL | ORAL | Status: DC

## 2020-10-28 MED ORDER — PROSOURCE TF PO LIQD
45.0000 mL | Freq: Two times a day (BID) | ORAL | Status: DC
  Administered 2020-10-28 – 2020-11-01 (×7): 45 mL
  Filled 2020-10-28 (×7): qty 45

## 2020-10-28 MED ORDER — SODIUM ZIRCONIUM CYCLOSILICATE 10 G PO PACK
10.0000 g | PACK | Freq: Once | ORAL | Status: AC
  Administered 2020-10-28: 10 g
  Filled 2020-10-28: qty 1

## 2020-10-28 MED ORDER — VITAL AF 1.2 CAL PO LIQD
1000.0000 mL | ORAL | Status: DC
  Administered 2020-10-28 – 2020-10-31 (×4): 1000 mL

## 2020-10-28 MED ORDER — FREE WATER
200.0000 mL | Status: DC
  Administered 2020-10-28 – 2020-11-01 (×24): 200 mL

## 2020-10-28 MED ORDER — HEPARIN SODIUM (PORCINE) 5000 UNIT/ML IJ SOLN
7500.0000 [IU] | Freq: Three times a day (TID) | INTRAMUSCULAR | Status: DC
  Administered 2020-10-28 – 2020-10-31 (×9): 7500 [IU] via SUBCUTANEOUS
  Filled 2020-10-28 (×8): qty 2

## 2020-10-28 MED ORDER — SODIUM ZIRCONIUM CYCLOSILICATE 10 G PO PACK: 10.0000 g | PACK | Freq: Once | ORAL | Status: DC

## 2020-10-28 MED ORDER — LACTULOSE ENEMA
300.0000 mL | Freq: Once | ORAL | Status: AC
  Administered 2020-10-28: 300 mL via RECTAL
  Filled 2020-10-28: qty 300

## 2020-10-28 MED ORDER — JEVITY 1.2 CAL PO LIQD: 1000.0000 mL | ORAL | Status: DC

## 2020-10-28 NOTE — Progress Notes (Signed)
Notified MD Adefeso verbally-in person of pt hypotension, awaiting MD orders.

## 2020-10-28 NOTE — Progress Notes (Signed)
SLP Cancellation Note  Patient Details Name: EYDEN DOBIE MRN: [id number] DOB: 1946-02-13   Cancelled treatment:       Reason Eval/Treat Not Completed: Patient not medically ready;Fatigue/lethargy limiting ability to participate; SLP discussed with RN, Pt continues to be inappropriate for BSE at this time. Will check back tomorrow.  Thank you,  Genene Churn, [place]    [place] 10/28/2020, 12:19 PM

## 2020-10-28 NOTE — Progress Notes (Signed)
PROGRESS NOTE    Darren Burke  AOZ:[id number] DOB: Nov 19, 1945 DOA: 10/21/2020 PCP: Rosita Fire, MD   Brief Narrative:  Darren Burke a 75 y.o.malewith medical history significantfor longtimetobacco use, COPD(emphysema),GERD,stage IV CKD, chronic constipation, hypertension, paroxysmal atrial flutter who presented to the emergency department from PCP office with shortness of breath and hypoxia. He was noted to have a pulse ox of 80 to 81% on room air. Patient was admitted with acute hypoxemic respiratory failure secondary to Covid pneumonia and is unvaccinated. He and his family were unsure of starting remdesivir, but would now like to start this medication and therefore it will be initiated 12/28. His hypoxemia is currently improved to 1 L nasal cannula oxygen.  He is having worsening electrolyte abnormalities and appears to be catatonic on 1/2, but is improving.  He was noted to have low blood pressure readings overnight on 1/3 and continues to have significant hyponatremia and hypokalemia.  NG tube to be placed for free water flushes and feedings.   Assessment & Plan:   Principal Problem:   Pneumonia due to COVID-19 virus Active Problems:   Pulmonary emphysema ([place])   COPD with acute exacerbation (HCC)   SOB (shortness of breath)   Hypoxia   Acute and chronic respiratory failure with hypoxia (HCC)   HTN (hypertension)   Paroxysmal atrial flutter (HCC)   Anemia   CKD (chronic kidney disease), stage IV (HCC)   Transaminitis   1. Acute respiratory failure with hypoxia secondary to Covid pneumonia-patient is unvaccinated. He has underlying chronic lung disease and other co-morbidities. He remains HighRisk for adverse outcomeandseverelungparenchymal injury. He and his family initially refused the use of remdesivir;thenon 10/22/2020 accepted to be treated along with the use of steroids. Patient with good response to the initiation of barcitiniband currently  down to just1L high flow nasal cannula supplementation. Will continue current management, supportive care and continue to wean off supplementation as tolerated. Has completed remdesivir infusion. 2. Catatonic state versus acute metabolic encephalopathy.  Notably started on Seroquel for hospital-acquired delirium which may be contributing to this process versus significant electrolyte abnormalities.    Continue to treat electrolyte disturbances as noted below and hold Seroquel. 3. 1 set blood culture positive for micrococcus likely contaminant.  Repeat blood cultures negative.  Plan to discontinue Rocephin. 4. Hypotension in the setting of essential hypertension.  Hold hydrochlorothiazide and continue aggressive IV fluid.  Monitor closely. 5. Chronic stage IV kidney disease -his renal function is stable overall and will continue following trend. Continue minimizing nephrotoxic agents and avoiding hypotension.  6. COPD with acute exacerbation secondary to Covid infection treating with IV steroids and bronchodilators as mentioned above. 7. Anemia in CKD-follow CBC closely; no signs of overt bleeding appreciated. 8. Paroxysmal atrial flutter-currently in sinus rhythm. Rate controlled; no chronically on anticoagulation. Continue aspirin. 9. Chronic constipation - laxative therapy ordered. Follow response and further improve bowel management as required. 10. Transaminitis - likely reactive from current Covid infection. Improving. Continue to follow stability with repeat labs in a.m.  11. Hypernatremia/hyperkalemia: continue D5W at higher infusion rate and start NG tube free water flushes as well as feeds.  Lokelma to be given via NG tube with close monitoring.  DVT prophylaxis: Lovenox Code Status:Full Family Communication:Discussed with daughter over the phone on 1/2, tried calling 1/3 with no response Disposition Plan: Status is: Inpatient  Remains inpatient appropriate because:IV  treatments appropriate due to intensity of illness or inability to take PO and Inpatient level of care appropriate  due to severity of illness   Dispo: The patient is from: Home Anticipated d/c is to: Home Anticipated d/c date is: 3 daysor so. Patient currently is not medically stable to d/c.Patient will be continued on steroids and Barcitinib. Has completed Remdesivir infusion. Will provide gentle fluid resuscitation to assist with hypernatremia and discontinue use of Seroquel.   Consultants:  None  Procedures:  NGT 1/3  Antimicrobials:  Anti-infectives (From admission, onward)   Start     Dose/Rate Route Frequency Ordered Stop   10/26/20 1730  cefTRIAXone (ROCEPHIN) 2 g in sodium chloride 0.9 % 100 mL IVPB        2 g 200 mL/hr over 30 Minutes Intravenous Every 24 hours 10/26/20 1636     10/23/20 1000  remdesivir 100 mg in sodium chloride 0.9 % 100 mL IVPB       "Followed by" Linked Group Details   100 mg 200 mL/hr over 30 Minutes Intravenous Daily 10/22/20 1154 10/26/20 1128   10/22/20 1200  remdesivir 100 mg in sodium chloride 0.9 % 100 mL IVPB       "Followed by" Linked Group Details   100 mg 200 mL/hr over 30 Minutes Intravenous Every 30 min 10/22/20 1154 10/22/20 1436   10/22/20 1000  remdesivir 100 mg in sodium chloride 0.9 % 100 mL IVPB  Status:  Discontinued        100 mg 200 mL/hr over 30 Minutes Intravenous Daily 10/21/20 1353 10/21/20 1418   10/21/20 1430  remdesivir 100 mg in sodium chloride 0.9 % 100 mL IVPB  Status:  Discontinued        100 mg 200 mL/hr over 30 Minutes Intravenous Every 1 hr x 2 10/21/20 1353 10/21/20 1418       Subjective: Patient seen and evaluated today ongoing confusion and minimal responsiveness.  He was noted to have significant drops in blood pressure overnight.  IV fluid rate has been increased.  Objective: Vitals:   10/28/20 0200 10/28/20 0300 10/28/20 0400 10/28/20 0500  BP: (!)  88/56 (!) 93/42 97/65   Pulse: 79 81 76   Resp: (!) 30 (!) 34 (!) 29   Temp:      TempSrc:      SpO2: 100% 94% 95%   Weight:    94.3 kg  Height:        Intake/Output Summary (Last 24 hours) at 10/28/2020 1137 Last data filed at 10/28/2020 0403 Gross per 24 hour  Intake 802.41 ml  Output 1400 ml  Net -597.59 ml   Filed Weights   10/21/20 1155 10/28/20 0500  Weight: 101.2 kg 94.3 kg    Examination:  General exam: Appears very minimally responsive Respiratory system: Clear to auscultation. Respiratory effort normal.  Currently on 2 L nasal cannula. Cardiovascular system: S1 & S2 heard, RRR.  Gastrointestinal system: Abdomen is soft Central nervous system: Minimally responsive Extremities: No edema Skin: No significant lesions noted Psychiatry: Flat affect.    Data Reviewed: I have personally reviewed following labs and imaging studies  CBC: Recent Labs  Lab 10/22/20 0435 10/23/20 0518 10/24/20 0603 10/25/20 0646 10/26/20 0537 10/28/20 0420  WBC 5.2 8.4 10.2 11.5* 11.4* 11.8*  NEUTROABS 4.5 7.3 9.0* 9.8* 9.7*  --   HGB 10.6* 10.8* 11.2* 10.7* 10.9* 10.1*  HCT 34.4* 36.1* 36.8* 35.5* 37.7* 35.5*  MCV 97.5 98.4 98.4 100.3* 102.7* 105.0*  PLT 237 277 321 303 270 683   Basic Metabolic Panel: Recent Labs  Lab 10/22/20 0435 10/23/20 0518 10/24/20  6599 10/25/20 0646 10/26/20 0429 10/26/20 1316 10/27/20 1545 10/28/20 0420  NA 141 144 148* 151* 156* 157* 155* 157*  K 5.0 4.5 4.7 5.2* 5.7* 5.6* 5.7* 5.6*  CL 108 109 112* 115* 119* 117* 120* 121*  CO2 22 25 25 28 28 30 28 27   GLUCOSE 147* 141* 143* 124* 123* 162* 145* 163*  BUN 67* 67* 72* 88* 94* 100* 109* 114*  CREATININE 2.21* 2.10* 1.97* 2.49* 2.63* 2.78* 2.95* 2.87*  CALCIUM 8.0* 8.3* 8.4* 8.6* 8.8* 9.0 8.6* 8.2*  MG 2.7* 2.6* 2.8* 3.1* 3.1*  --   --  2.9*  PHOS 4.9* 4.6 4.2 5.3* 5.6*  --   --   --    GFR: Estimated Creatinine Clearance: 25.5 mL/min (A) (by C-G formula based on SCr of 2.87 mg/dL  (H)). Liver Function Tests: Recent Labs  Lab 10/23/20 0518 10/24/20 0603 10/25/20 0646 10/26/20 0429 10/28/20 0420  AST 64* 52* 57* 51* 35  ALT 54* 56* 66* 71* 65*  ALKPHOS 37* 35* 36* 36* 35*  BILITOT 0.8 1.0 1.1 0.9 0.8  PROT 6.7 6.8 6.7 6.8 6.2*  ALBUMIN 2.5* 2.6* 2.7* 2.7* 2.2*   No results for input(s): LIPASE, AMYLASE in the last 168 hours. Recent Labs  Lab 10/27/20 0511 10/28/20 0420  AMMONIA 40* 46*   Coagulation Profile: No results for input(s): INR, PROTIME in the last 168 hours. Cardiac Enzymes: No results for input(s): CKTOTAL, CKMB, CKMBINDEX, TROPONINI in the last 168 hours. BNP (last 3 results) No results for input(s): PROBNP in the last 8760 hours. HbA1C: No results for input(s): HGBA1C in the last 72 hours. CBG: Recent Labs  Lab 10/27/20 0826 10/28/20 0011 10/28/20 0300 10/28/20 0410 10/28/20 0525  GLUCAP 136* 123* 144* 152* 158*   Lipid Profile: No results for input(s): CHOL, HDL, LDLCALC, TRIG, CHOLHDL, LDLDIRECT in the last 72 hours. Thyroid Function Tests: No results for input(s): TSH, T4TOTAL, FREET4, T3FREE, THYROIDAB in the last 72 hours. Anemia Panel: Recent Labs    10/26/20 0429  FERRITIN 616*   Sepsis Labs: Recent Labs  Lab 10/21/20 1424  PROCALCITON 0.25  LATICACIDVEN 1.8    Recent Results (from the past 240 hour(s))  Resp Panel by RT-PCR (Flu A&B, Covid) Nasopharyngeal Swab     Status: Abnormal   Collection Time: 10/21/20 12:03 PM   Specimen: Nasopharyngeal Swab; Nasopharyngeal(NP) swabs in vial transport medium  Result Value Ref Range Status   SARS Coronavirus 2 by RT PCR POSITIVE (A) NEGATIVE Final    Comment: RESULT CALLED TO, READ BACK BY AND VERIFIED WITH: CARDWELL,L AT 1325 BY HUFFINES,S ON 10/21/20 (NOTE) SARS-CoV-2 target nucleic acids are DETECTED.  The SARS-CoV-2 RNA is generally detectable in upper respiratory specimens during the acute phase of infection. Positive results are indicative of the presence of  the identified virus, but do not rule out bacterial infection or co-infection with other pathogens not detected by the test. Clinical correlation with patient history and other diagnostic information is necessary to determine patient infection status. The expected result is Negative.  Fact Sheet for Patients: EntrepreneurPulse.com.au  Fact Sheet for Healthcare Providers: IncredibleEmployment.be  This test is not yet approved or cleared by the [place] FDA and  has been authorized for detection and/or diagnosis of SARS-CoV-2 by FDA under an Emergency Use Authorization (EUA).  This EUA will remain in effect (meaning this  test can be used) for the duration of  the COVID-19 declaration under Section 564(b)(1) of the Act, 21 U.S.C. section 360bbb-3(b)(1),  unless the authorization is terminated or revoked sooner.     Influenza A by PCR NEGATIVE NEGATIVE Final   Influenza B by PCR NEGATIVE NEGATIVE Final    Comment: (NOTE) The Xpert Xpress SARS-CoV-2/FLU/RSV plus assay is intended as an aid in the diagnosis of influenza from Nasopharyngeal swab specimens and should not be used as a sole basis for treatment. Nasal washings and aspirates are unacceptable for Xpert Xpress SARS-CoV-2/FLU/RSV testing.  Fact Sheet for Patients: EntrepreneurPulse.com.au  Fact Sheet for Healthcare Providers: IncredibleEmployment.be  This test is not yet approved or cleared by the [place] FDA and has been authorized for detection and/or diagnosis of SARS-CoV-2 by FDA under an Emergency Use Authorization (EUA). This EUA will remain in effect (meaning this test can be used) for the duration of the COVID-19 declaration under Section 564(b)(1) of the Act, 21 U.S.C. section 360bbb-3(b)(1), unless the authorization is terminated or revoked.  Performed at [facility], [address], [place], [place] [address]   Blood Culture  (routine x 2)     Status: Abnormal   Collection Time: 10/21/20  2:24 PM   Specimen: BLOOD  Result Value Ref Range Status   Specimen Description   Final    BLOOD RIGHT ANTECUBITAL Performed at [facility], [address] [place], [place] [address]    Special Requests   Final    BOTTLES DRAWN AEROBIC AND ANAEROBIC Blood Culture adequate volume Performed at [facility], [address], [place], [place] [address]    Culture  Setup Time   Final    AEROBIC BOTTLE ONLY GRAM POSITIVE COCCI Gram Stain Report Called to,Read Back By and Verified With: S BOLES,RN@2119  10/22/20 MKELLY Organism ID to follow    Culture (A)  Final    MICROCOCCUS SPECIES Standardized susceptibility testing for this organism is not available. Performed at [facility], [place] [address], [place], [place] [address]    Report Status 10/24/2020 FINAL  Final  Blood Culture ID Panel (Reflexed)     Status: None   Collection Time: 10/21/20  2:24 PM  Result Value Ref Range Status   Enterococcus faecalis NOT DETECTED NOT DETECTED Final   Enterococcus Faecium NOT DETECTED NOT DETECTED Final   Listeria monocytogenes NOT DETECTED NOT DETECTED Final   Staphylococcus species NOT DETECTED NOT DETECTED Final   Staphylococcus aureus (BCID) NOT DETECTED NOT DETECTED Final   Staphylococcus epidermidis NOT DETECTED NOT DETECTED Final   Staphylococcus lugdunensis NOT DETECTED NOT DETECTED Final   Streptococcus species NOT DETECTED NOT DETECTED Final   Streptococcus agalactiae NOT DETECTED NOT DETECTED Final   Streptococcus pneumoniae NOT DETECTED NOT DETECTED Final   Streptococcus pyogenes NOT DETECTED NOT DETECTED Final   A.calcoaceticus-baumannii NOT DETECTED NOT DETECTED Final   Bacteroides fragilis NOT DETECTED NOT DETECTED Final   Enterobacterales NOT DETECTED NOT DETECTED Final   Enterobacter cloacae complex NOT DETECTED NOT DETECTED Final   Escherichia coli NOT DETECTED NOT DETECTED Final   Klebsiella aerogenes NOT  DETECTED NOT DETECTED Final   Klebsiella oxytoca NOT DETECTED NOT DETECTED Final   Klebsiella pneumoniae NOT DETECTED NOT DETECTED Final   Proteus species NOT DETECTED NOT DETECTED Final   Salmonella species NOT DETECTED NOT DETECTED Final   Serratia marcescens NOT DETECTED NOT DETECTED Final   Haemophilus influenzae NOT DETECTED NOT DETECTED Final   Neisseria meningitidis NOT DETECTED NOT DETECTED Final   Pseudomonas aeruginosa NOT DETECTED NOT DETECTED Final   Stenotrophomonas maltophilia NOT DETECTED NOT DETECTED Final   Candida albicans  NOT DETECTED NOT DETECTED Final   Candida auris NOT DETECTED NOT DETECTED Final   Candida glabrata NOT DETECTED NOT DETECTED Final   Candida krusei NOT DETECTED NOT DETECTED Final   Candida parapsilosis NOT DETECTED NOT DETECTED Final   Candida tropicalis NOT DETECTED NOT DETECTED Final   Cryptococcus neoformans/gattii NOT DETECTED NOT DETECTED Final    Comment: Performed at [facility], [place] [address] [place], [place] [address]  Blood Culture (routine x 2)     Status: None (Preliminary result)   Collection Time: 10/21/20  3:25 PM   Specimen: BLOOD  Result Value Ref Range Status   Specimen Description   Final    BLOOD LEFT ANTECUBITAL Performed at [facility], [address] [place], [place] [address]    Special Requests   Final    BOTTLES DRAWN AEROBIC AND ANAEROBIC Blood Culture adequate volume Performed at [facility], [address], [place], [place] [address]    Culture  Setup Time   Final    GRAM POSITIVE RODS Gram Stain Report Called to,Read Back By and Verified With: TIFFANY SMITH @1624  10/26/20 BY JONES,T ANAEROBIC BOTTLE ONLY [facility] Performed at [facility], [address] [place], [place] [address]    Culture GRAM POSITIVE RODS  Final   Report Status PENDING  Incomplete  MRSA PCR Screening     Status: None   Collection Time: 10/23/20 10:15 AM   Specimen: Nasopharyngeal  Result Value Ref Range Status   MRSA by PCR NEGATIVE  NEGATIVE Final    Comment:        The GeneXpert MRSA Assay (FDA approved for NASAL specimens only), is one component of a comprehensive MRSA colonization surveillance program. It is not intended to diagnose MRSA infection nor to guide or monitor treatment for MRSA infections. Performed at [facility], [address], [place], [place] [address]   Culture, blood (routine x 2)     Status: None (Preliminary result)   Collection Time: 10/27/20  8:55 AM   Specimen: BLOOD LEFT HAND  Result Value Ref Range Status   Specimen Description   Final    BLOOD LEFT HAND BOTTLES DRAWN AEROBIC AND ANAEROBIC   Special Requests Blood Culture adequate volume  Final   Culture   Final    NO GROWTH < 24 HOURS Performed at [facility], [address], [place], [place] [address]    Report Status PENDING  Incomplete  Culture, blood (routine x 2)     Status: None (Preliminary result)   Collection Time: 10/27/20  8:58 AM   Specimen: BLOOD LEFT ARM  Result Value Ref Range Status   Specimen Description BLOOD LEFT ARM BOTTLES DRAWN AEROBIC AND ANAEROBIC  Final   Special Requests Blood Culture adequate volume  Final   Culture   Final    NO GROWTH < 24 HOURS Performed at [facility], [address], [place], [place] [address]    Report Status PENDING  Incomplete         Radiology Studies: DG CHEST PORT 1 VIEW  Result Date: 10/28/2020 CLINICAL DATA:  Post NG tube placement. EXAM: PORTABLE CHEST 1 VIEW COMPARISON:  10/25/2020 and prior. FINDINGS: Diffuse interstitial prominence. No pneumothorax or pleural effusion. Decreased conspicuity of patchy right basilar opacities. Stable cardiomediastinal silhouette. Non weighted enteric tube tip and side hole overlie the gastric body. IMPRESSION: Enteric tube tip and side hole overlie the gastric body. Emphysematous changes. Right basilar opacities are less conspicuous than prior exam. Electronically Signed  By: Primitivo Gauze M.D.   On: 10/28/2020 09:38         Scheduled Meds: . vitamin C  500 mg Oral Daily  . aspirin EC  81 mg Oral Daily  . baricitinib  2 mg Oral Daily  . Chlorhexidine Gluconate Cloth  6 each Topical Daily  . dexamethasone (DECADRON) injection  6 mg Intravenous Q24H  . feeding supplement (PROSource TF)  90 mL Per Tube BID  . free water  200 mL Per Tube Q4H  . heparin injection (subcutaneous)  10,000 Units Subcutaneous Q8H  . lactulose  300 mL Rectal Once  . LORazepam  1 mg Intravenous TID  . pantoprazole  40 mg Oral Daily  . sodium zirconium cyclosilicate  10 g Per Tube Once  . zinc sulfate  220 mg Oral Daily   Continuous Infusions: . cefTRIAXone (ROCEPHIN)  IV Stopped (10/27/20 1702)  . dextrose 125 mL/hr at 10/28/20 0035  . feeding supplement (VITAL 1.5 CAL)       LOS: 7 days    Time spent: 35 minutes    Tomi Grandpre Darleen Crocker, DO [facility]  If 7PM-7AM, please contact night-coverage [email or web address] 10/28/2020, 11:37 AM

## 2020-10-28 NOTE — Progress Notes (Addendum)
Initial Nutrition Assessment  DOCUMENTATION CODES:   Obesity unspecified  INTERVENTION:   Initiate Vital AF 1.2 @ 20 ml/hr via NGT and increase by 10 ml every 8 hours to goal rate of 70 ml/hr.   45 ml Prosource TF BID  200 ml free water flush every 4 hours per MD  Tube feeding regimen provides 2320 kcal (91% of needs), 148 grams of protein, and 1362 ml of H2O. Total free water: 2562 ml daily  -Monitor K, Mg, and Phos daily, and replete as needed, due to high refeeding risk   NUTRITION DIAGNOSIS:   Increased nutrient needs related to acute illness (COVID-19) as evidenced by estimated needs.  GOAL:   Patient will meet greater than or equal to 90% of their needs  MONITOR:   PO intake,Labs,Weight trends,Skin,I & O's  REASON FOR ASSESSMENT:   Consult Assessment of nutrition requirement/status  ASSESSMENT:   Darren Burke is a 75 y.o. male with medical history significant for longtime tobacco use, COPD (emphysema), GERD, stage IV CKD, chronic constipation, hypertension, paroxysmal atrial flutter who presented to the emergency department from PCP office with shortness of breath and hypoxia.  He was noted to have a pulse ox of 80 to 81% on room air.  He has been placed on 4 L nasal cannula with improvement of oxygen saturation to 87%.  He does not wear home oxygen.  He has had progressive shortness of breath for the past 2 days.  He feels unwell overall.  He says that he ran out of his albuterol inhaler 4 days ago.  His symptoms have been persistent and progressively getting worse.  His shortness of breath is much worse when ambulating or moving.  He denies chest pain.  He reports a nonproductive cough.  He lives home alone.  He is unvaccinated for COVID-19.  Pt admitted with acute respiratory failure with hypoxia secondary to COVID pneumonia.   1/2- BSE attempted- pt too lethargic to participate 1/3- rectal tube placed, NGT placed (tip of tube in stomach)  Reviewed I/O's: +332  ml x 24 hours and +629 ml since admission  UOP: 1.4 L x 24 hours  Pt unavailable at time of visit. Unable to obtain further nutriton-related history or complete nutriton-focused physical exam at this time.   Per chart review, pt with poor oral intake over the past 4-5 day secondary to confusion and mentation (po 0-25%).   Case discussed with MD, who confirms plan for NGT for TF and medications. Per MD, plan for free water due to hypernatremia.   ADDENDUM (1615): Vital 1.5 not available per [facility]. Will modify order.   Medications reviewed and include vitamin C, decadron, lactulose, ativan, lokelma, zinc sulfate and dextrose 5% solution @ 125 ml/hr.   Lab Results  Component Value Date   HGBA1C 5.8 (H) 11/25/2017   PTA DM medications are none.   Labs reviewed: Na: 157, K: 5.6, Mg: 2.9, CBGS: 144-158 (inpatient orders for glycemic control are none).   Diet Order:   Diet Order            Diet Heart Room service appropriate? Yes; Fluid consistency: Thin  Diet effective now                 EDUCATION NEEDS:   No education needs have been identified at this time  Skin:  Skin Assessment: Reviewed RN Assessment  Last BM:  10/27/20  Height:   Ht Readings from Last 1 Encounters:  10/21/20 5\' 7"  (1.702 m)  Weight:   Wt Readings from Last 1 Encounters:  10/28/20 94.3 kg    Ideal Body Weight:  67.3 kg  BMI:  Body mass index is 32.56 kg/m.  Estimated Nutritional Needs:   Kcal:  2300-2500  Protein:  140-155 grams  Fluid:  > 2 L    Loistine Chance, RD, LDN, [place] Registered Dietitian II Certified Diabetes Care and Education Specialist Please refer to [facility] for RD and/or RD on-call/weekend/after hours pager

## 2020-10-28 NOTE — Progress Notes (Signed)
Notified MD of pt continuous hypotension. Orders received for 2nd 250 mL bolus of NS.

## 2020-10-28 NOTE — Progress Notes (Signed)
RN reported that patient's BP dropped to 76/38 with a MAP of 45, IV NS 250 mL x 1 was given with improvement of BP to 123/40 and a MAP of 72.  Patient was noted to be dehydrated, he was already on D5W (temporarily increased to 125 mils per hour) possibly due to hyponatremia with sodium at 155 and K+ at 5.7 (worse than on admission on 10/24/2020).  Nurse called again due to subsequent drop in BP to 93/42 with a MAP of 58.  Another IV NS 250 mL x 1 was given with improved BP 111/95 (and a MAP of 98) D5W changed back to 50 cc/h (BMP still pending)  He was also noted to have hyperammonemia 40 > 46.  1 dose of lactulose (rectally) ordered with plan to transition to p.o. once mental status improved and lactulose still required

## 2020-10-29 ENCOUNTER — Encounter (HOSPITAL_COMMUNITY): Payer: Self-pay | Admitting: Family Medicine

## 2020-10-29 ENCOUNTER — Inpatient Hospital Stay (HOSPITAL_COMMUNITY): Payer: Medicare Other

## 2020-10-29 DIAGNOSIS — Z515 Encounter for palliative care: Secondary | ICD-10-CM | POA: Diagnosis not present

## 2020-10-29 DIAGNOSIS — Z7189 Other specified counseling: Secondary | ICD-10-CM | POA: Diagnosis not present

## 2020-10-29 DIAGNOSIS — J1282 Pneumonia due to coronavirus disease 2019: Secondary | ICD-10-CM | POA: Diagnosis not present

## 2020-10-29 DIAGNOSIS — U071 COVID-19: Secondary | ICD-10-CM | POA: Diagnosis not present

## 2020-10-29 LAB — BLOOD GAS, ARTERIAL
Acid-base deficit: 0.8 mmol/L (ref 0.0–2.0)
Bicarbonate: 23.2 mmol/L (ref 20.0–28.0)
FIO2: 28
O2 Saturation: 92.3 %
Patient temperature: 37
pCO2 arterial: 58.4 mmHg — ABNORMAL HIGH (ref 32.0–48.0)
pH, Arterial: 7.262 — ABNORMAL LOW (ref 7.350–7.450)
pO2, Arterial: 69.9 mmHg — ABNORMAL LOW (ref 83.0–108.0)

## 2020-10-29 LAB — COMPREHENSIVE METABOLIC PANEL
ALT: 87 U/L — ABNORMAL HIGH (ref 0–44)
AST: 48 U/L — ABNORMAL HIGH (ref 15–41)
Albumin: 2.1 g/dL — ABNORMAL LOW (ref 3.5–5.0)
Alkaline Phosphatase: 38 U/L (ref 38–126)
Anion gap: 10 (ref 5–15)
BUN: 119 mg/dL — ABNORMAL HIGH (ref 8–23)
CO2: 24 mmol/L (ref 22–32)
Calcium: 8.2 mg/dL — ABNORMAL LOW (ref 8.9–10.3)
Chloride: 114 mmol/L — ABNORMAL HIGH (ref 98–111)
Creatinine, Ser: 2.93 mg/dL — ABNORMAL HIGH (ref 0.61–1.24)
GFR, Estimated: 22 mL/min — ABNORMAL LOW (ref >60)
Glucose, Bld: 244 mg/dL — ABNORMAL HIGH (ref 70–99)
Potassium: 5.4 mmol/L — ABNORMAL HIGH (ref 3.5–5.1)
Sodium: 148 mmol/L — ABNORMAL HIGH (ref 135–145)
Total Bilirubin: 0.7 mg/dL (ref 0.3–1.2)
Total Protein: 6.2 g/dL — ABNORMAL LOW (ref 6.5–8.1)

## 2020-10-29 LAB — CBC
HCT: 30.3 % — ABNORMAL LOW (ref 39.0–52.0)
Hemoglobin: 8.8 g/dL — ABNORMAL LOW (ref 13.0–17.0)
MCH: 30.3 pg (ref 26.0–34.0)
MCHC: 29 g/dL — ABNORMAL LOW (ref 30.0–36.0)
MCV: 104.5 fL — ABNORMAL HIGH (ref 80.0–100.0)
Platelets: 217 10*3/uL (ref 150–400)
RBC: 2.9 MIL/uL — ABNORMAL LOW (ref 4.22–5.81)
RDW: 13.9 % (ref 11.5–15.5)
WBC: 11.8 10*3/uL — ABNORMAL HIGH (ref 4.0–10.5)
nRBC: 0.2 % (ref 0.0–0.2)

## 2020-10-29 LAB — GLUCOSE, CAPILLARY
Glucose-Capillary: 135 mg/dL — ABNORMAL HIGH (ref 70–99)
Glucose-Capillary: 138 mg/dL — ABNORMAL HIGH (ref 70–99)
Glucose-Capillary: 144 mg/dL — ABNORMAL HIGH (ref 70–99)
Glucose-Capillary: 201 mg/dL — ABNORMAL HIGH (ref 70–99)
Glucose-Capillary: 204 mg/dL — ABNORMAL HIGH (ref 70–99)

## 2020-10-29 LAB — PHOSPHORUS
Phosphorus: 3.7 mg/dL (ref 2.5–4.6)
Phosphorus: 3.9 mg/dL (ref 2.5–4.6)

## 2020-10-29 LAB — AMMONIA: Ammonia: 33 umol/L (ref 9–35)

## 2020-10-29 LAB — TSH: TSH: 0.192 u[IU]/mL — ABNORMAL LOW (ref 0.350–4.500)

## 2020-10-29 MED ORDER — DILTIAZEM HCL-DEXTROSE 125-5 MG/125ML-% IV SOLN (PREMIX)
5.0000 mg/h | INTRAVENOUS | Status: DC
  Administered 2020-10-29: 5 mg/h via INTRAVENOUS
  Administered 2020-10-30 (×2): 10 mg/h via INTRAVENOUS
  Administered 2020-10-31: 5 mg/h via INTRAVENOUS
  Filled 2020-10-29 (×4): qty 125

## 2020-10-29 MED ORDER — SODIUM ZIRCONIUM CYCLOSILICATE 10 G PO PACK
10.0000 g | PACK | Freq: Two times a day (BID) | ORAL | Status: DC
  Administered 2020-10-29 – 2020-11-02 (×7): 10 g
  Filled 2020-10-29 (×8): qty 1

## 2020-10-29 MED ORDER — DIGOXIN 0.25 MG/ML IJ SOLN
0.2500 mg | Freq: Once | INTRAMUSCULAR | Status: AC
  Administered 2020-10-29: 0.25 mg via INTRAVENOUS
  Filled 2020-10-29: qty 2

## 2020-10-29 NOTE — Progress Notes (Signed)
Inpatient Diabetes Program Recommendations  AACE/ADA: New Consensus Statement on Inpatient Glycemic Control   Target Ranges:  Prepandial:   less than 140 mg/dL      Peak postprandial:   less than 180 mg/dL (1-2 hours)      Critically ill patients:  140 - 180 mg/dL   Results for Darren Burke, Darren Burke (MRN [id number]) as of 10/29/2020 09:02  Ref. Range 10/28/2020 05:25 10/28/2020 17:01 10/28/2020 23:33 10/29/2020 05:18 10/29/2020 08:00  Glucose-Capillary Latest Ref Range: 70 - 99 mg/dL 158 (H) 115 (H) 134 (H) 204 (H) 201 (H)   Review of Glycemic Control  Diabetes history: No Outpatient Diabetes medications: NA Current orders for Inpatient glycemic control: None; Decadron 6 mg Q24H, Vital @ 70 ml/hr  Inpatient Diabetes Program Recommendations:    Insulin-Correction: Please consider ordering CBGs Q4H with Novolog 0-9 units Q4H.  Insulin-Tube Feeding: If steroids and tube feeding are continued, please consider ordering Novolog 3 units Q4H for tube feeding coverage. If tube feeding is stopped or held then Novolog tube feeding coverage should also be stopped or held.  Thanks, Barnie Alderman, RN, MSN, CDE Diabetes Coordinator Inpatient Diabetes Program [phone] (Team Pager from 8am to 5pm)

## 2020-10-29 NOTE — Progress Notes (Signed)
PROGRESS NOTE    [name]  YTK:[id number] DOB: 08/02/1946 DOA: 10/21/2020 PCP: Rosita Fire, MD   Brief Narrative:  Darren Burke a 75 y.o.malewith medical history significantfor longtimetobacco use, COPD(emphysema),GERD,stage IV CKD, chronic constipation, hypertension, paroxysmal atrial flutter who presented to the emergency department from PCP office with shortness of breath and hypoxia. He was noted to have a pulse ox of 80 to 81% on room air. Patient was admitted with acute hypoxemic respiratory failure secondary to Covid pneumonia and is unvaccinated. He and his family were unsure of starting remdesivir, but would now like to start this medication and therefore it will be initiated 12/28. His hypoxemiais currently improved to 1 L nasal cannula oxygen. He is having worsening electrolyte abnormalities and appears to be catatonic on 1/2, but is improving.  He was noted to have low blood pressure readings overnight on 1/3 and continues to have significant hyponatremia and hypokalemia.  NG tube to be placed for free water flushes and feedings.  CT head with no acute findings.  ABG with hypercapnia noted with BiPAP started.  Also noted to be in atrial fibrillation with RVR and started on Cardizem drip.   Assessment & Plan:   Principal Problem:   Pneumonia due to COVID-19 virus Active Problems:   Pulmonary emphysema ([place])   COPD with acute exacerbation (HCC)   SOB (shortness of breath)   Hypoxia   Acute and chronic respiratory failure with hypoxia (HCC)   HTN (hypertension)   Paroxysmal atrial flutter (HCC)   Anemia   CKD (chronic kidney disease), stage IV (HCC)   Transaminitis   1. Acute respiratory failure with hypoxia secondary to Covid pneumonia-patient is unvaccinated. He has underlying chronic lung disease and other co-morbidities. He remains HighRisk for adverse outcomeandseverelungparenchymal injury. He and his family initially refused the use of  remdesivir;thenon 10/22/2020 accepted to be treated along with the use of steroids. Patient with good response to the initiation of barcitiniband currently down to just1L high flow nasal cannula supplementation. Will continue current management, supportive care and continue to wean off supplementation as tolerated. Has completed remdesivir infusion. 2. Acute encephalopathy-multifactorial.  Likely related to electrolyte derangements as well as now hypercapnia.  BiPAP will be initiated.  Follow ABG in a.m.  CT head performed 1/4 with no acute findings. 3. 1 set blood culture positive for micrococcus likely contaminant.  Repeat blood cultures negative.  Plan to discontinue Rocephin. 4. Low TSH. Plan to check Free T4. 5. Hypotension in the setting of essential hypertension.  Hold hydrochlorothiazide and continue aggressive IV fluid.  Monitor closely. 6. Chronic stage IV kidney disease -his renal function is stable overall and will continue following trend. Continue minimizing nephrotoxic agents and avoiding hypotension.  7. COPD with acute exacerbation secondary to Covid infection treating with IV steroids and bronchodilators as mentioned above. 8. Anemia in CKD-follow CBC closely; no signs of overt bleeding appreciated. 9. Paroxysmal atrial flutter-currently with RVR.  Started on Cardizem drip for rate control.  Monitor on telemetry. 10. Chronic constipation - laxative therapy ordered. Follow response and further improve bowel management as required. 11. Transaminitis - likely reactive from current Covid infection. Currently stable.  Continue to monitor. 12. Hypernatremia/hyperkalemia: continue D5W at higher infusion rate and started NG tube free water flushes as well as feeds.  Lokelma to be given via NG tube with close monitoring.  DVT prophylaxis: Lovenox Code Status:Full Family Communication:Discussed with daughterover the phone on 1/4 Disposition Plan: Status is:  Inpatient  Remains inpatient  appropriate because:IV treatments appropriate due to intensity of illness or inability to take PO and Inpatient level of care appropriate due to severity of illness   Dispo: The patient is from: Home Anticipated d/c is to: TBD Anticipated d/c date is: 3 daysor so. Patient currently is not medically stable to d/c.Patient will be continued on steroids and Barcitinib. Has completed Remdesivir infusion. Will provide gentle fluid resuscitation to assist with hypernatremia anddiscontinue use of Seroquel.  Now with NG tube feedings and free water flushes.  Continue to monitor electrolytes.  BiPAP started for hypercapnia.  Also on Cardizem drip for A. fib with RVR.   Consultants:  Palliative care  Procedures:  NGT 1/3  See below; CT head 1/4 with no significant findings  Antimicrobials:  Anti-infectives (From admission, onward)   Start     Dose/Rate Route Frequency Ordered Stop   10/26/20 1730  cefTRIAXone (ROCEPHIN) 2 g in sodium chloride 0.9 % 100 mL IVPB  Status:  Discontinued        2 g 200 mL/hr over 30 Minutes Intravenous Every 24 hours 10/26/20 1636 10/29/20 0942   10/23/20 1000  remdesivir 100 mg in sodium chloride 0.9 % 100 mL IVPB       "Followed by" Linked Group Details   100 mg 200 mL/hr over 30 Minutes Intravenous Daily 10/22/20 1154 10/26/20 1128   10/22/20 1200  remdesivir 100 mg in sodium chloride 0.9 % 100 mL IVPB       "Followed by" Linked Group Details   100 mg 200 mL/hr over 30 Minutes Intravenous Every 30 min 10/22/20 1154 10/22/20 1436   10/22/20 1000  remdesivir 100 mg in sodium chloride 0.9 % 100 mL IVPB  Status:  Discontinued        100 mg 200 mL/hr over 30 Minutes Intravenous Daily 10/21/20 1353 10/21/20 1418   10/21/20 1430  remdesivir 100 mg in sodium chloride 0.9 % 100 mL IVPB  Status:  Discontinued        100 mg 200 mL/hr over 30 Minutes Intravenous Every 1 hr x 2 10/21/20  1353 10/21/20 1418       Subjective: Patient seen and evaluated today with ongoing unresponsiveness noted.  Tube feedings have been initiated.  No acute overnight events noted.  Objective: Vitals:   10/29/20 1215 10/29/20 1230 10/29/20 1245 10/29/20 1300  BP: 92/67 107/65 125/66   Pulse:  98 86   Resp: (!) 26 (!) 27 (!) 23   Temp:    99.1 F (37.3 C)  TempSrc:    Axillary  SpO2:  100% 99%   Weight:      Height:        Intake/Output Summary (Last 24 hours) at 10/29/2020 1513 Last data filed at 10/29/2020 0900 Gross per 24 hour  Intake 510 ml  Output 2200 ml  Net -1690 ml   Filed Weights   10/21/20 1155 10/28/20 0500 10/29/20 0537  Weight: 101.2 kg 94.3 kg 97.5 kg    Examination:  General exam: Appears unresponsive Respiratory system: Clear to auscultation. Respiratory effort normal.  Currently on 3 L nasal cannula oxygen Cardiovascular system: S1 & S2 heard, RRR.  Gastrointestinal system: Abdomen is soft Central nervous system: Unresponsive Extremities: No edema Skin: No significant lesions noted Psychiatry: Cannot be assessed    Data Reviewed: I have personally reviewed following labs and imaging studies  CBC: Recent Labs  Lab 10/23/20 0518 10/24/20 0603 10/25/20 0646 10/26/20 0537 10/28/20 0420 10/29/20 0731  WBC 8.4 10.2 11.5*  11.4* 11.8* 11.8*  NEUTROABS 7.3 9.0* 9.8* 9.7*  --   --   HGB 10.8* 11.2* 10.7* 10.9* 10.1* 8.8*  HCT 36.1* 36.8* 35.5* 37.7* 35.5* 30.3*  MCV 98.4 98.4 100.3* 102.7* 105.0* 104.5*  PLT 277 321 303 270 225 176   Basic Metabolic Panel: Recent Labs  Lab 10/23/20 0518 10/24/20 0603 10/25/20 0646 10/26/20 0429 10/26/20 1316 10/27/20 1545 10/28/20 0420 10/28/20 1154 10/28/20 2033 10/29/20 0456 10/29/20 0731  NA 144 148* 151* 156* 157* 155* 157*  --   --   --  148*  K 4.5 4.7 5.2* 5.7* 5.6* 5.7* 5.6*  --   --   --  5.4*  CL 109 112* 115* 119* 117* 120* 121*  --   --   --  114*  CO2 25 25 28 28 30 28 27   --   --   --  24   GLUCOSE 141* 143* 124* 123* 162* 145* 163*  --   --   --  244*  BUN 67* 72* 88* 94* 100* 109* 114*  --   --   --  119*  CREATININE 2.10* 1.97* 2.49* 2.63* 2.78* 2.95* 2.87*  --   --   --  2.93*  CALCIUM 8.3* 8.4* 8.6* 8.8* 9.0 8.6* 8.2*  --   --   --  8.2*  MG 2.6* 2.8* 3.1* 3.1*  --   --  2.9*  --   --   --   --   PHOS 4.6 4.2 5.3* 5.6*  --   --   --  3.6 4.0 3.7  --    GFR: Estimated Creatinine Clearance: 25.4 mL/min (A) (by C-G formula based on SCr of 2.93 mg/dL (H)). Liver Function Tests: Recent Labs  Lab 10/24/20 0603 10/25/20 0646 10/26/20 0429 10/28/20 0420 10/29/20 0731  AST 52* 57* 51* 35 48*  ALT 56* 66* 71* 65* 87*  ALKPHOS 35* 36* 36* 35* 38  BILITOT 1.0 1.1 0.9 0.8 0.7  PROT 6.8 6.7 6.8 6.2* 6.2*  ALBUMIN 2.6* 2.7* 2.7* 2.2* 2.1*   No results for input(s): LIPASE, AMYLASE in the last 168 hours. Recent Labs  Lab 10/27/20 0511 10/28/20 0420 10/29/20 0731  AMMONIA 40* 46* 33   Coagulation Profile: No results for input(s): INR, PROTIME in the last 168 hours. Cardiac Enzymes: No results for input(s): CKTOTAL, CKMB, CKMBINDEX, TROPONINI in the last 168 hours. BNP (last 3 results) No results for input(s): PROBNP in the last 8760 hours. HbA1C: No results for input(s): HGBA1C in the last 72 hours. CBG: Recent Labs  Lab 10/28/20 0525 10/28/20 1701 10/28/20 2333 10/29/20 0518 10/29/20 0800  GLUCAP 158* 115* 134* 204* 201*   Lipid Profile: No results for input(s): CHOL, HDL, LDLCALC, TRIG, CHOLHDL, LDLDIRECT in the last 72 hours. Thyroid Function Tests: Recent Labs    10/29/20 0731  TSH 0.192*   Anemia Panel: No results for input(s): VITAMINB12, FOLATE, FERRITIN, TIBC, IRON, RETICCTPCT in the last 72 hours. Sepsis Labs: No results for input(s): PROCALCITON, LATICACIDVEN in the last 168 hours.  Recent Results (from the past 240 hour(s))  Resp Panel by RT-PCR (Flu A&B, Covid) Nasopharyngeal Swab     Status: Abnormal   Collection Time: 10/21/20 12:03  PM   Specimen: Nasopharyngeal Swab; Nasopharyngeal(NP) swabs in vial transport medium  Result Value Ref Range Status   SARS Coronavirus 2 by RT PCR POSITIVE (A) NEGATIVE Final    Comment: RESULT CALLED TO, READ BACK BY AND VERIFIED WITH: CARDWELL,L AT  1325 BY HUFFINES,S ON 10/21/20 (NOTE) SARS-CoV-2 target nucleic acids are DETECTED.  The SARS-CoV-2 RNA is generally detectable in upper respiratory specimens during the acute phase of infection. Positive results are indicative of the presence of the identified virus, but do not rule out bacterial infection or co-infection with other pathogens not detected by the test. Clinical correlation with patient history and other diagnostic information is necessary to determine patient infection status. The expected result is Negative.  Fact Sheet for Patients: EntrepreneurPulse.com.au  Fact Sheet for Healthcare Providers: IncredibleEmployment.be  This test is not yet approved or cleared by the [place] FDA and  has been authorized for detection and/or diagnosis of SARS-CoV-2 by FDA under an Emergency Use Authorization (EUA).  This EUA will remain in effect (meaning this  test can be used) for the duration of  the COVID-19 declaration under Section 564(b)(1) of the Act, 21 U.S.C. section 360bbb-3(b)(1), unless the authorization is terminated or revoked sooner.     Influenza A by PCR NEGATIVE NEGATIVE Final   Influenza B by PCR NEGATIVE NEGATIVE Final    Comment: (NOTE) The Xpert Xpress SARS-CoV-2/FLU/RSV plus assay is intended as an aid in the diagnosis of influenza from Nasopharyngeal swab specimens and should not be used as a sole basis for treatment. Nasal washings and aspirates are unacceptable for Xpert Xpress SARS-CoV-2/FLU/RSV testing.  Fact Sheet for Patients: EntrepreneurPulse.com.au  Fact Sheet for Healthcare Providers: IncredibleEmployment.be  This  test is not yet approved or cleared by the [place] FDA and has been authorized for detection and/or diagnosis of SARS-CoV-2 by FDA under an Emergency Use Authorization (EUA). This EUA will remain in effect (meaning this test can be used) for the duration of the COVID-19 declaration under Section 564(b)(1) of the Act, 21 U.S.C. section 360bbb-3(b)(1), unless the authorization is terminated or revoked.  Performed at [facility], [address], [place], [place] [address]   Blood Culture (routine x 2)     Status: Abnormal   Collection Time: 10/21/20  2:24 PM   Specimen: BLOOD  Result Value Ref Range Status   Specimen Description   Final    BLOOD RIGHT ANTECUBITAL Performed at [facility], [address], [place], [place] [address]    Special Requests   Final    BOTTLES DRAWN AEROBIC AND ANAEROBIC Blood Culture adequate volume Performed at [facility], [address] [place], [place] [address]    Culture  Setup Time   Final    AEROBIC BOTTLE ONLY GRAM POSITIVE COCCI Gram Stain Report Called to,Read Back By and Verified With: S BOLES,RN@2119  10/22/20 MKELLY Organism ID to follow    Culture (A)  Final    MICROCOCCUS SPECIES Standardized susceptibility testing for this organism is not available. Performed at [facility], [place] [address] [place], [place] [address]    Report Status 10/24/2020 FINAL  Final  Blood Culture ID Panel (Reflexed)     Status: None   Collection Time: 10/21/20  2:24 PM  Result Value Ref Range Status   Enterococcus faecalis NOT DETECTED NOT DETECTED Final   Enterococcus Faecium NOT DETECTED NOT DETECTED Final   Listeria monocytogenes NOT DETECTED NOT DETECTED Final   Staphylococcus species NOT DETECTED NOT DETECTED Final   Staphylococcus aureus (BCID) NOT DETECTED NOT DETECTED Final   Staphylococcus epidermidis NOT DETECTED NOT DETECTED Final   Staphylococcus lugdunensis NOT DETECTED NOT DETECTED Final   Streptococcus species NOT DETECTED NOT  DETECTED Final   Streptococcus agalactiae NOT DETECTED NOT DETECTED Final  Streptococcus pneumoniae NOT DETECTED NOT DETECTED Final   Streptococcus pyogenes NOT DETECTED NOT DETECTED Final   A.calcoaceticus-baumannii NOT DETECTED NOT DETECTED Final   Bacteroides fragilis NOT DETECTED NOT DETECTED Final   Enterobacterales NOT DETECTED NOT DETECTED Final   Enterobacter cloacae complex NOT DETECTED NOT DETECTED Final   Escherichia coli NOT DETECTED NOT DETECTED Final   Klebsiella aerogenes NOT DETECTED NOT DETECTED Final   Klebsiella oxytoca NOT DETECTED NOT DETECTED Final   Klebsiella pneumoniae NOT DETECTED NOT DETECTED Final   Proteus species NOT DETECTED NOT DETECTED Final   Salmonella species NOT DETECTED NOT DETECTED Final   Serratia marcescens NOT DETECTED NOT DETECTED Final   Haemophilus influenzae NOT DETECTED NOT DETECTED Final   Neisseria meningitidis NOT DETECTED NOT DETECTED Final   Pseudomonas aeruginosa NOT DETECTED NOT DETECTED Final   Stenotrophomonas maltophilia NOT DETECTED NOT DETECTED Final   Candida albicans NOT DETECTED NOT DETECTED Final   Candida auris NOT DETECTED NOT DETECTED Final   Candida glabrata NOT DETECTED NOT DETECTED Final   Candida krusei NOT DETECTED NOT DETECTED Final   Candida parapsilosis NOT DETECTED NOT DETECTED Final   Candida tropicalis NOT DETECTED NOT DETECTED Final   Cryptococcus neoformans/gattii NOT DETECTED NOT DETECTED Final    Comment: Performed at [facility], [address] [place], [place] [address]  Blood Culture (routine x 2)     Status: Abnormal   Collection Time: 10/21/20  3:25 PM   Specimen: BLOOD  Result Value Ref Range Status   Specimen Description   Final    BLOOD LEFT ANTECUBITAL Performed at [facility], [address] [place], [place] [address]    Special Requests   Final    BOTTLES DRAWN AEROBIC AND ANAEROBIC Blood Culture adequate volume Performed at [facility], [address] [place], [place]  [address]    Culture  Setup Time   Final    GRAM POSITIVE RODS Gram Stain Report Called to,Read Back By and Verified With: TIFFANY SMITH @1624  10/26/20 BY JONES,T ANAEROBIC BOTTLE ONLY [facility] Performed at [facility], [address] [place], [place] [address]    Culture (A)  Final    PROPIONIBACTERIUM ACNES Standardized susceptibility testing for this organism is not available. Performed at [facility], [place] [address], [place], [place] [address]    Report Status 10/28/2020 FINAL  Final  MRSA PCR Screening     Status: None   Collection Time: 10/23/20 10:15 AM   Specimen: Nasopharyngeal  Result Value Ref Range Status   MRSA by PCR NEGATIVE NEGATIVE Final    Comment:        The GeneXpert MRSA Assay (FDA approved for NASAL specimens only), is one component of a comprehensive MRSA colonization surveillance program. It is not intended to diagnose MRSA infection nor to guide or monitor treatment for MRSA infections. Performed at [facility], [address], [place], [place] [address]   Culture, blood (routine x 2)     Status: None (Preliminary result)   Collection Time: 10/27/20  8:55 AM   Specimen: BLOOD LEFT HAND  Result Value Ref Range Status   Specimen Description   Final    BLOOD LEFT HAND BOTTLES DRAWN AEROBIC AND ANAEROBIC   Special Requests Blood Culture adequate volume  Final   Culture   Final    NO GROWTH < 24 HOURS Performed at [facility], [address], [place], [place] [address]    Report Status PENDING  Incomplete  Culture, blood (routine  x 2)     Status: None (Preliminary result)   Collection Time: 10/27/20  8:58 AM   Specimen: BLOOD LEFT ARM  Result Value Ref Range Status   Specimen Description BLOOD LEFT ARM BOTTLES DRAWN AEROBIC AND ANAEROBIC  Final   Special Requests Blood Culture adequate volume  Final   Culture   Final    NO GROWTH < 24 HOURS Performed at [facility], [address] [place], [place] [address]    Report Status PENDING  Incomplete          Radiology Studies: CT HEAD WO CONTRAST  Result Date: 10/29/2020 CLINICAL DATA:  Mental status change, unknown cause. Altered mental status, COVID positive. EXAM: CT HEAD WITHOUT CONTRAST TECHNIQUE: Contiguous axial images were obtained from the base of the skull through the vertex without intravenous contrast. COMPARISON:  No pertinent prior exams available for comparison. FINDINGS: Brain: Mildly motion degraded exam. Cerebral volume is normal for age. There is no acute intracranial hemorrhage. No demarcated cortical infarct. No extra-axial fluid collection. No evidence of intracranial mass. No midline shift. Vascular: No hyperdense vessel. Atherosclerotic calcifications Skull: Normal. Negative for fracture or focal lesion. Sinuses/Orbits: Visualized orbits show no acute finding. Scattered secretions within the left ethmoid air cells. Moderate mucosal thickening within the right maxillary sinus with associated chronic reactive osteitis. There is full-thickness erosion of the anterior right maxillary sinus wall inferiorly (for instance as seen on series 3, image 3)(series 5, image 23). Other: Partially visualized nasoenteric tube. IMPRESSION: Mildly motion degraded exam. No evidence of acute intracranial abnormality. Moderate mucosal thickening within the right maxillary sinus with associated chronic reactive osteitis. There is focal full-thickness erosion of the anterior right maxillary sinus wall inferiorly. Mild left ethmoid sinusitis. Electronically Signed   By: Kellie Simmering DO   On: 10/29/2020 11:43   DG CHEST PORT 1 VIEW  Result Date: 10/28/2020 CLINICAL DATA:  Post NG tube placement. EXAM: PORTABLE CHEST 1 VIEW COMPARISON:  10/25/2020 and prior. FINDINGS: Diffuse interstitial prominence. No pneumothorax or pleural effusion. Decreased conspicuity of patchy right basilar opacities. Stable cardiomediastinal silhouette. Non weighted enteric tube tip and side hole overlie the gastric body.  IMPRESSION: Enteric tube tip and side hole overlie the gastric body. Emphysematous changes. Right basilar opacities are less conspicuous than prior exam. Electronically Signed   By: Primitivo Gauze M.D.   On: 10/28/2020 09:38        Scheduled Meds: . vitamin C  500 mg Oral Daily  . aspirin EC  81 mg Oral Daily  . baricitinib  2 mg Oral Daily  . Chlorhexidine Gluconate Cloth  6 each Topical Daily  . dexamethasone (DECADRON) injection  6 mg Intravenous Q24H  . feeding supplement (PROSource TF)  45 mL Per Tube BID  . free water  200 mL Per Tube Q4H  . heparin injection (subcutaneous)  7,500 Units Subcutaneous Q8H  . LORazepam  1 mg Intravenous TID  . pantoprazole  40 mg Oral Daily  . sodium zirconium cyclosilicate  10 g Per Tube BID  . zinc sulfate  220 mg Oral Daily   Continuous Infusions: . dextrose 125 mL/hr at 10/29/20 0938  . diltiazem (CARDIZEM) infusion 5 mg/hr (10/29/20 1210)  . feeding supplement (VITAL AF 1.2 CAL) 1,000 mL (10/28/20 1649)     LOS: 8 days    Time spent: 35 minutes    Velvet Moomaw D Manuella Ghazi, DO [facility]  If 7PM-7AM, please contact night-coverage [email or web address] 10/29/2020, 3:13 PM

## 2020-10-29 NOTE — Progress Notes (Signed)
SLP Cancellation Note  Patient Details Name: Darren Burke MRN: [id number] DOB: May 24, 1946   Cancelled treatment:       Reason Eval/Treat Not Completed: Patient not medically ready;Fatigue/lethargy limiting ability to participate; Pt continues to be inappropriate for BSE and has NG in place. SLP will sign off. Please consult when Pt is appropriate.  Thank you,  Genene Churn, [place]    [place] 10/29/2020, 2:21 PM

## 2020-10-29 NOTE — Consult Note (Signed)
Consultation Note Date: 10/29/2020   Patient Name: Darren Burke  DOB: Feb 27, 1946  MRN: [id number]  Age / Sex: 75 y.o., male  PCP: Rosita Fire, MD Referring Physician: Rodena Goldmann, DO  Reason for Consultation: Establishing goals of care and Psychosocial/spiritual support  HPI/Patient Profile: 75 y.o. male  with past medical history of longtime tobacco use, quit smoking but has 120 pack years history, COPD, GERD, CKD 4, chronic constipation, HTN, PA flutter, lives at [facility] admitted on 10/21/2020 with Covid positive pneumonia.   Clinical Assessment and Goals of Care: I have reviewed medical records including EPIC notes, labs and imaging, received report from bedside nursing staff, examined the patient.  Mr. Lori is lying quietly in bed.  He looks acutely/chronically ill and frail, much older than stated age.  There is no family at bedside at this time.  Call to daughter, [name] Hanks to discuss diagnosis prognosis, GOC, EOL wishes, disposition and options.  I introduced Palliative Medicine as specialized medical care for people living with serious illness. It focuses on providing relief from the symptoms and stress of a serious illness.   We discussed a brief life review of the patient.  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  She shares that he divorced about 20 years ago and this was really very difficult for him.  She states that he lost his first child in infancy on he had his wife's 1 year anniversary.  He has 2 boys and 2 girls.  [name] is the oldest, but she shares decision making with the oldest son, Legrand Como.  [name] shares that Mr. Montenegro has been a resident of [place] for about 10 years.  As far as functional and nutritional status, Mr. Acklin lives at [facility] apartments.  His daughter, Awilda Metro is his personal aide, providing  assistance with all ADLs   [name] shares that Mr. Kye hated going to the doctors.  She says shares that he would start an argument to get out of going.    We discussed current illness and what it means in the larger context of on-going co-morbidities.  Natural disease trajectory and expectations at EOL were discussed.  [name] shares Mr. Vu's distrust of doctors/declining to visit doctors that worsened during Covid.  [name] shares that her father never really talked about medical issues or concerns, nor did he talk about what he wanted at end-of-life.  [name] states that "he has been dying for about 40 years".  She states when her son was born that he shared he would never see her son go to kindergarten, and this was many years ago.  I attempted to elicit values and goals of care important to the patient.  The difference between aggressive medical intervention and comfort care was considered in light of the patient's goals of care.  I encouraged [name] to talk with her brother Legrand Como about what matters to Mr. Sotelo, what he would and would not want as far as aggressive care.  [name]  shares that Mr. Beyl has been afraid to go to sleep over the last few months, afraid that he will die.  Advanced directives, concepts specific to code status, were considered and discussed.  We talked about the concept of "treat the treatable, but allowing natural death".  I shared that although Mr. Riches has not mentioned CODE STATUS in the past, he has let family know what matters to him by his actions, such as not visiting MDs.  Questions and concerns were addressed.  The family was encouraged to call with questions or concerns.   Conference with attending, bedside nursing staff, transition of care team related to patient condition, needs, goals of care. PMT to continue to follow.  HCPOA    NEXT OF KIN -daughter, Awilda Metro and eldest son Keefe Zawistowski are surrogate decision makers.  Mr. Strawderman also  has another son, unnamed and another daughter, Thayer Headings.    SUMMARY OF RECOMMENDATIONS   Continue to treat the treatable Family is considering CODE STATUS Time for outcomes   Code Status/Advance Care Planning:  Full code -family is considering CODE STATUS.  Symptom Management:   Per hospitalist, no additional needs at this time.  Palliative Prophylaxis:   Oral Care and Turn Reposition  Additional Recommendations (Limitations, Scope, Preferences):  Full Scope Treatment  Psycho-social/Spiritual:   Desire for further Chaplaincy support:no  Additional Recommendations: Caregiving  Support/Resources and Education on Hospice  Prognosis:   Unable to determine, based on outcomes.  Guarded at this point.  Discharge Planning: To be determined, based on outcomes.      Primary Diagnoses: Present on Admission: . Pneumonia due to COVID-19 virus . SOB (shortness of breath) . Pulmonary emphysema ([place]) . Paroxysmal atrial flutter ([place]) . Hypoxia . HTN (hypertension) . COPD with acute exacerbation ([place]) . Anemia . CKD (chronic kidney disease), stage IV ([place]) . Transaminitis   I have reviewed the medical record, interviewed the patient and family, and examined the patient. The following aspects are pertinent.  Past Medical History:  Diagnosis Date  . COPD (chronic obstructive pulmonary disease) (HCC)    Emphysema radiographically  . GERD (gastroesophageal reflux disease)   . Hypertension   . Paroxysmal atrial flutter (HCC)    Social History   Socioeconomic History  . Marital status: Divorced    Spouse name: Not on file  . Number of children: Not on file  . Years of education: Not on file  . Highest education level: Not on file  Occupational History  . Not on file  Tobacco Use  . Smoking status: Former Smoker    Packs/day: 3.00    Years: 40.00    Pack years: 120.00    Types: Cigarettes  . Smokeless tobacco: Never Used  Vaping Use  . Vaping Use: Never used   Substance and Sexual Activity  . Alcohol use: No  . Drug use: No  . Sexual activity: Not on file  Other Topics Concern  . Not on file  Social History Narrative  . Not on file   Social Determinants of Health   Financial Resource Strain: Not on file  Food Insecurity: Not on file  Transportation Needs: Not on file  Physical Activity: Not on file  Stress: Not on file  Social Connections: Not on file   Family History  Problem Relation Age of Onset  . Heart failure Mother   . Stroke Father    Scheduled Meds: . vitamin C  500 mg Oral Daily  . aspirin EC  81 mg  Oral Daily  . baricitinib  2 mg Oral Daily  . Chlorhexidine Gluconate Cloth  6 each Topical Daily  . dexamethasone (DECADRON) injection  6 mg Intravenous Q24H  . feeding supplement (PROSource TF)  45 mL Per Tube BID  . free water  200 mL Per Tube Q4H  . heparin injection (subcutaneous)  7,500 Units Subcutaneous Q8H  . LORazepam  1 mg Intravenous TID  . pantoprazole  40 mg Oral Daily  . sodium zirconium cyclosilicate  10 g Per Tube BID  . zinc sulfate  220 mg Oral Daily   Continuous Infusions: . dextrose 125 mL/hr at 10/29/20 0938  . diltiazem (CARDIZEM) infusion 5 mg/hr (10/29/20 1210)  . feeding supplement (VITAL AF 1.2 CAL) 1,000 mL (10/28/20 1649)   PRN Meds:.acetaminophen, albuterol, bisacodyl, chlorpheniramine-HYDROcodone, guaiFENesin-dextromethorphan, haloperidol lactate, lactulose, ondansetron **OR** ondansetron (ZOFRAN) IV, oxyCODONE Medications Prior to Admission:  Prior to Admission medications   Medication Sig Start Date End Date Taking? Authorizing Provider  albuterol (PROVENTIL HFA;VENTOLIN HFA) 108 (90 Base) MCG/ACT inhaler Inhale 2 puffs into the lungs every 6 (six) hours as needed for wheezing or shortness of breath. 11/27/17  Yes Tat, Shanon Brow, MD  Albuterol Sulfate 2.5 MG/0.5ML NEBU Inhale 2.5 mg into the lungs in the morning and at bedtime.   Yes [provider]  aspirin EC 81 MG EC tablet Take  1 tablet (81 mg total) by mouth daily. 12/12/16  Yes Florencia Reasons, MD  carvedilol (COREG) 3.125 MG tablet Take 1 tablet (3.125 mg total) by mouth 2 (two) times daily with a meal. 12/14/16  Yes Sinda Du, MD  umeclidinium bromide (INCRUSE ELLIPTA) 62.5 MCG/INH AEPB Inhale 1 puff into the lungs daily. 12/14/16  Yes Sinda Du, MD  azithromycin (ZITHROMAX) 500 MG tablet Take 1 tablet (500 mg total) by mouth daily. Patient not taking: No sig reported 11/27/17   Tat, Shanon Brow, MD  lactulose (CHRONULAC) 10 GM/15ML solution Take 30 mLs (20 g total) by mouth 2 (two) times daily as needed for moderate constipation. Patient not taking: No sig reported 02/03/18   Orpah Greek, MD  polyethylene glycol (MIRALAX / GLYCOLAX) packet Take 17 g by mouth daily. Patient not taking: No sig reported 11/28/17   Tat, Shanon Brow, MD  predniSONE (DELTASONE) 10 MG tablet Take 6 tablets (60 mg total) by mouth daily with breakfast. And decrease by one tablet daily Patient not taking: No sig reported 11/28/17   Tat, Shanon Brow, MD  senna (SENOKOT) 8.6 MG TABS tablet Take 2 tablets (17.2 mg total) by mouth daily. Patient not taking: No sig reported 11/28/17   Orson Eva, MD   Allergies  Allergen Reactions  . Penicillins Hives and Itching    Has patient had a PCN reaction causing immediate rash, facial/tongue/throat swelling, SOB or lightheadedness with hypotension: no Has patient had a PCN reaction causing severe rash involving mucus membranes or skin necrosis: no Has patient had a PCN reaction that required hospitalization: no Has patient had a PCN reaction occurring within the last 10 years: no If all of the above answers are "NO", then may proceed with Cephalosporin use.   Review of Systems  Unable to perform ROS: Acuity of condition    Physical Exam Vitals and nursing note reviewed.  Constitutional:      General: He is not in acute distress.    Appearance: He is ill-appearing.  HENT:     Head: Atraumatic.   Cardiovascular:     Rate and Rhythm: Normal rate.  Pulmonary:  Comments: High flow oxygen    Vital Signs: BP 125/66   Pulse 86   Temp 98.9 F (37.2 C) (Axillary)   Resp (!) 23   Ht 5\' 7"  (1.702 m)   Wt 97.5 kg   SpO2 99%   BMI 33.67 kg/m  Pain Scale: FLACC   Pain Score: 1    SpO2: SpO2: 99 % O2 Device:SpO2: 99 % O2 Flow Rate: .O2 Flow Rate (L/min): 2 L/min  IO: Intake/output summary:   Intake/Output Summary (Last 24 hours) at 10/29/2020 1302 Last data filed at 10/29/2020 0900 Gross per 24 hour  Intake 510 ml  Output 2200 ml  Net -1690 ml    LBM: Last BM Date: 10/29/20 Baseline Weight: Weight: 101.2 kg Most recent weight: Weight: 97.5 kg     Palliative Assessment/Data:   Flowsheet Rows   Flowsheet Row Most Recent Value  Intake Tab   Referral Department Hospitalist  Unit at Time of Referral Intermediate Care Unit  Palliative Care Primary Diagnosis Pulmonary  Date Notified 10/29/20  Palliative Care Type New Palliative care  Reason for referral Clarify Goals of Care  Date of Admission 10/21/20  Date first seen by Palliative Care 10/29/20  # of days Palliative referral response time 0 Day(s)  # of days IP prior to Palliative referral 8  Clinical Assessment   Palliative Performance Scale Score 20%  Pain Max last 24 hours Not able to report  Pain Min Last 24 hours Not able to report  Dyspnea Max Last 24 Hours Not able to report  Dyspnea Min Last 24 hours Not able to report  Psychosocial & Spiritual Assessment   Palliative Care Outcomes       Time In: 1420 Time Out: 1530 Time Total: 70 minutes Greater than 50%  of this time was spent counseling and coordinating care related to the above assessment and plan.  Signed by: Drue Novel, NP   Please contact Palliative Medicine Team phone at [phone] for questions and concerns.  For individual provider: See Shea Evans

## 2020-10-30 DIAGNOSIS — J9621 Acute and chronic respiratory failure with hypoxia: Secondary | ICD-10-CM | POA: Diagnosis not present

## 2020-10-30 DIAGNOSIS — Z515 Encounter for palliative care: Secondary | ICD-10-CM | POA: Diagnosis not present

## 2020-10-30 DIAGNOSIS — U071 COVID-19: Secondary | ICD-10-CM | POA: Diagnosis not present

## 2020-10-30 DIAGNOSIS — Z7189 Other specified counseling: Secondary | ICD-10-CM | POA: Diagnosis not present

## 2020-10-30 LAB — GLUCOSE, CAPILLARY
Glucose-Capillary: 167 mg/dL — ABNORMAL HIGH (ref 70–99)
Glucose-Capillary: 216 mg/dL — ABNORMAL HIGH (ref 70–99)
Glucose-Capillary: 188 mg/dL — ABNORMAL HIGH (ref 70–99)
Glucose-Capillary: 145 mg/dL — ABNORMAL HIGH (ref 70–99)
Glucose-Capillary: 131 mg/dL — ABNORMAL HIGH (ref 70–99)

## 2020-10-30 LAB — BLOOD GAS, ARTERIAL
Acid-base deficit: 0.2 mmol/L (ref 0.0–2.0)
Bicarbonate: 23.8 mmol/L (ref 20.0–28.0)
Drawn by: 22223
FIO2: 28
O2 Saturation: 94.8 %
Patient temperature: 37
pCO2 arterial: 54.3 mmHg — ABNORMAL HIGH (ref 32.0–48.0)
pH, Arterial: 7.293 — ABNORMAL LOW (ref 7.350–7.450)
pO2, Arterial: 81.8 mmHg — ABNORMAL LOW (ref 83.0–108.0)

## 2020-10-30 LAB — COMPREHENSIVE METABOLIC PANEL
ALT: 81 U/L — ABNORMAL HIGH (ref 0–44)
AST: 34 U/L (ref 15–41)
Albumin: 2.1 g/dL — ABNORMAL LOW (ref 3.5–5.0)
Alkaline Phosphatase: 34 U/L — ABNORMAL LOW (ref 38–126)
Anion gap: 10 (ref 5–15)
BUN: 112 mg/dL — ABNORMAL HIGH (ref 8–23)
CO2: 24 mmol/L (ref 22–32)
Calcium: 7.7 mg/dL — ABNORMAL LOW (ref 8.9–10.3)
Chloride: 108 mmol/L (ref 98–111)
Creatinine, Ser: 2.84 mg/dL — ABNORMAL HIGH (ref 0.61–1.24)
GFR, Estimated: 23 mL/min — ABNORMAL LOW (ref >60)
Glucose, Bld: 230 mg/dL — ABNORMAL HIGH (ref 70–99)
Potassium: 4.7 mmol/L (ref 3.5–5.1)
Sodium: 142 mmol/L (ref 135–145)
Total Bilirubin: 0.7 mg/dL (ref 0.3–1.2)
Total Protein: 5.8 g/dL — ABNORMAL LOW (ref 6.5–8.1)

## 2020-10-30 LAB — CBC
HCT: 29.3 % — ABNORMAL LOW (ref 39.0–52.0)
Hemoglobin: 8.4 g/dL — ABNORMAL LOW (ref 13.0–17.0)
MCH: 29.7 pg (ref 26.0–34.0)
MCHC: 28.7 g/dL — ABNORMAL LOW (ref 30.0–36.0)
MCV: 103.5 fL — ABNORMAL HIGH (ref 80.0–100.0)
Platelets: 221 10*3/uL (ref 150–400)
RBC: 2.83 MIL/uL — ABNORMAL LOW (ref 4.22–5.81)
RDW: 13.5 % (ref 11.5–15.5)
WBC: 11.8 10*3/uL — ABNORMAL HIGH (ref 4.0–10.5)
nRBC: 0.2 % (ref 0.0–0.2)

## 2020-10-30 LAB — MAGNESIUM: Magnesium: 2.5 mg/dL — ABNORMAL HIGH (ref 1.7–2.4)

## 2020-10-30 LAB — T4, FREE: Free T4: 1.03 ng/dL (ref 0.61–1.12)

## 2020-10-30 LAB — AMMONIA: Ammonia: 38 umol/L — ABNORMAL HIGH (ref 9–35)

## 2020-10-30 MED ORDER — FLUMAZENIL 0.5 MG/5ML IV SOLN
0.2000 mg | Freq: Once | INTRAVENOUS | Status: AC
  Administered 2020-10-30: 0.2 mg via INTRAVENOUS

## 2020-10-30 MED ORDER — CARVEDILOL 3.125 MG PO TABS
3.1250 mg | ORAL_TABLET | Freq: Two times a day (BID) | ORAL | Status: DC
  Administered 2020-10-31: 3.125 mg
  Filled 2020-10-30: qty 1

## 2020-10-30 NOTE — Progress Notes (Signed)
Pt not responding to voice, withdrawals from pain. MD at bedside and ordered Romazicon. After Romazicon given pt opened eyes and intermittently followed commands. MD made aware. Will continue to monitor.

## 2020-10-30 NOTE — Progress Notes (Signed)
PROGRESS NOTE    Darren Burke  BHA:[id number] DOB: 04/16/1946 DOA: 10/21/2020 PCP: Rosita Fire, MD   Brief Narrative:  Darren Burke a 75 y.o.malewith medical history significantfor longtimetobacco use, COPD(emphysema),GERD,stage IV CKD, chronic constipation, hypertension, paroxysmal atrial flutter who presented to the emergency department from PCP office with shortness of breath and hypoxia. He was noted to have a pulse ox of 80 to 81% on room air. Patient was admitted with acute hypoxemic respiratory failure secondary to Covid pneumonia and is unvaccinated. He and his family were unsure of starting remdesivir, but would now like to start this medication and therefore it will be initiated 12/28. His hypoxemiais currently improved to 1 L nasal cannula oxygen. He is having worsening electrolyte abnormalities and appears to be catatonic on 1/2, but is improving.  He was noted to have low blood pressure readings overnight on 1/3 and continues to have significant hyponatremia and hypokalemia.  NG tube to be placed for free water flushes and feedings.  CT head with no acute findings.  ABG with hypercapnia noted with BiPAP started.  Also noted to be in atrial fibrillation with RVR and started on Cardizem drip.   Assessment & Plan:   Principal Problem:   Pneumonia due to COVID-19 virus Active Problems:   Pulmonary emphysema ([place])   COPD with acute exacerbation (HCC)   SOB (shortness of breath)   Hypoxia   Acute and chronic respiratory failure with hypoxia (HCC)   HTN (hypertension)   Paroxysmal atrial flutter (HCC)   Anemia   CKD (chronic kidney disease), stage IV (HCC)   Transaminitis   1. Acute respiratory failure with hypoxia secondary to Covid pneumonia-patient is unvaccinated. He has underlying chronic lung disease and other co-morbidities. He remains HighRisk for adverse outcomeandseverelungparenchymal injury. He and his family initially refused the use of  remdesivir;thenon 10/22/2020 accepted to be treated along with the use of steroids. Patient with good response to the initiation of barcitiniband currently down to just1L high flow nasal cannula supplementation. Will continue current management, supportive care and continue to wean off supplementation as tolerated. Has completed remdesivir infusion. 2. Acute encephalopathy-multifactorial.  Family reports that he may have some baseline cognitive deficits and early signs of dementia at baseline.  CT head found to be negative.  Suspect that worsening mental status is related to acute infectious process/metabolic issues.  Limit sedating medications.  Mental status did improve today when he received Romazicon.  If no further improvement by a.m., can consider further work-up with MRI brain.  Repeat ABG in a.m. 3. 1 set blood culture positive for micrococcus likely contaminant.  Repeat blood cultures negative.  Plan to discontinue Rocephin. 4. Low TSH.  Free T4 is normal.  Repeat labs in 3 to 4 weeks as an outpatient 5. Chronic stage IV kidney disease -his creatinine is stable overall and will continue following trend. Continue minimizing nephrotoxic agents and avoiding hypotension.  Although, patient does have a significantly elevated BUN, which may be related to steroids.  We will also check FOBT. 6. COPD with acute exacerbation secondary to Covid infection treating with IV steroids and bronchodilators as mentioned above. 7. Anemia in CKD-follow CBC closely; no signs of overt bleeding appreciated.  Check FOBT 8. Paroxysmal atrial flutter-currently with RVR.  Started on Cardizem drip for rate control.  Will restart on carvedilol in efforts to wean off Cardizem.  Chadsvasc score of 2 for hypertension and age.  Will need to discuss with family risk/benefits of anticoagulation. 9. Chronic constipation -  laxative therapy ordered. Follow response and further improve bowel management as  required. 10. Transaminitis - likely reactive from current Covid infection. Currently stable.  Continue to monitor. 11. Hypernatremia/hyperkalemia: Improved after receiving hypotonic fluids and Lokelma.  Continue to monitor  DVT prophylaxis: Lovenox Code Status:Full Family Communication:Discussed with daughterover the phone on 1/5 Disposition Plan: Status is: Inpatient  Remains inpatient appropriate because:IV treatments appropriate due to intensity of illness or inability to take PO and Inpatient level of care appropriate due to severity of illness   Dispo: The patient is from: Home Anticipated d/c is to: TBD Anticipated d/c date is: 3 daysor so. Patient currently is not medically stable to d/c.Patient will be continued on steroids and Barcitinib. Has completed Remdesivir infusion. Will provide gentle fluid resuscitation to assist with hypernatremia anddiscontinue use of Seroquel.  Now with NG tube feedings and free water flushes.  Continue to monitor electrolytes. On Cardizem drip for A. fib with RVR.   Consultants:  Palliative care  Procedures:  NGT 1/3  See below; CT head 1/4 with no significant findings  Antimicrobials:  Anti-infectives (From admission, onward)   Start     Dose/Rate Route Frequency Ordered Stop   10/26/20 1730  cefTRIAXone (ROCEPHIN) 2 g in sodium chloride 0.9 % 100 mL IVPB  Status:  Discontinued        2 g 200 mL/hr over 30 Minutes Intravenous Every 24 hours 10/26/20 1636 10/29/20 0942   10/23/20 1000  remdesivir 100 mg in sodium chloride 0.9 % 100 mL IVPB       "Followed by" Linked Group Details   100 mg 200 mL/hr over 30 Minutes Intravenous Daily 10/22/20 1154 10/26/20 1128   10/22/20 1200  remdesivir 100 mg in sodium chloride 0.9 % 100 mL IVPB       "Followed by" Linked Group Details   100 mg 200 mL/hr over 30 Minutes Intravenous Every 30 min 10/22/20 1154 10/22/20 1436   10/22/20 1000   remdesivir 100 mg in sodium chloride 0.9 % 100 mL IVPB  Status:  Discontinued        100 mg 200 mL/hr over 30 Minutes Intravenous Daily 10/21/20 1353 10/21/20 1418   10/21/20 1430  remdesivir 100 mg in sodium chloride 0.9 % 100 mL IVPB  Status:  Discontinued        100 mg 200 mL/hr over 30 Minutes Intravenous Every 1 hr x 2 10/21/20 1353 10/21/20 1418      Subjective: Patient seen and evaluated today with ongoing unresponsiveness noted.  Tube feedings have been initiated.  No acute overnight events noted.  Objective: Vitals:   10/30/20 1630 10/30/20 1700 10/30/20 1730 10/30/20 1815  BP: (!) 152/58 126/64 (!) 149/70 (!) 142/60  Pulse: 94 75 89 96  Resp: (!) 31 (!) 26 (!) 22 (!) 35  Temp:      TempSrc:      SpO2: 98% 99% 99% 98%  Weight:      Height:        Intake/Output Summary (Last 24 hours) at 10/30/2020 2010 Last data filed at 10/30/2020 1824 Gross per 24 hour  Intake 5799.18 ml  Output 2050 ml  Net 3749.18 ml   Filed Weights   10/28/20 0500 10/29/20 0537 10/30/20 0501  Weight: 94.3 kg 97.5 kg 99.2 kg    Examination:  General exam: somnolent, does not respond Respiratory system: Clear to auscultation. Respiratory effort normal. Cardiovascular system:RRR. No murmurs, rubs, gallops. Gastrointestinal system: Abdomen is nondistended, soft and nontender. No organomegaly  or masses felt. Normal bowel sounds heard. NG tube in place Central nervous system: No focal neurological deficits. Extremities: No C/C/E, +pedal pulses Skin: No rashes, lesions or ulcers Psychiatry: unresponsive     Data Reviewed: I have personally reviewed following labs and imaging studies  CBC: Recent Labs  Lab 10/24/20 0603 10/25/20 0646 10/26/20 0537 10/28/20 0420 10/29/20 0731 10/30/20 0527  WBC 10.2 11.5* 11.4* 11.8* 11.8* 11.8*  NEUTROABS 9.0* 9.8* 9.7*  --   --   --   HGB 11.2* 10.7* 10.9* 10.1* 8.8* 8.4*  HCT 36.8* 35.5* 37.7* 35.5* 30.3* 29.3*  MCV 98.4 100.3* 102.7* 105.0*  104.5* 103.5*  PLT 321 303 270 225 217 426   Basic Metabolic Panel: Recent Labs  Lab 10/24/20 0603 10/25/20 0646 10/26/20 0429 10/26/20 1316 10/27/20 1545 10/28/20 0420 10/28/20 1154 10/28/20 2033 10/29/20 0456 10/29/20 0731 10/29/20 1912 10/30/20 0527  NA 148* 151* 156* 157* 155* 157*  --   --   --  148*  --  142  K 4.7 5.2* 5.7* 5.6* 5.7* 5.6*  --   --   --  5.4*  --  4.7  CL 112* 115* 119* 117* 120* 121*  --   --   --  114*  --  108  CO2 25 28 28 30 28 27   --   --   --  24  --  24  GLUCOSE 143* 124* 123* 162* 145* 163*  --   --   --  244*  --  230*  BUN 72* 88* 94* 100* 109* 114*  --   --   --  119*  --  112*  CREATININE 1.97* 2.49* 2.63* 2.78* 2.95* 2.87*  --   --   --  2.93*  --  2.84*  CALCIUM 8.4* 8.6* 8.8* 9.0 8.6* 8.2*  --   --   --  8.2*  --  7.7*  MG 2.8* 3.1* 3.1*  --   --  2.9*  --   --   --   --   --  2.5*  PHOS 4.2 5.3* 5.6*  --   --   --  3.6 4.0 3.7  --  3.9  --    GFR: Estimated Creatinine Clearance: 26.4 mL/min (A) (by C-G formula based on SCr of 2.84 mg/dL (H)). Liver Function Tests: Recent Labs  Lab 10/25/20 0646 10/26/20 0429 10/28/20 0420 10/29/20 0731 10/30/20 0527  AST 57* 51* 35 48* 34  ALT 66* 71* 65* 87* 81*  ALKPHOS 36* 36* 35* 38 34*  BILITOT 1.1 0.9 0.8 0.7 0.7  PROT 6.7 6.8 6.2* 6.2* 5.8*  ALBUMIN 2.7* 2.7* 2.2* 2.1* 2.1*   No results for input(s): LIPASE, AMYLASE in the last 168 hours. Recent Labs  Lab 10/27/20 0511 10/28/20 0420 10/29/20 0731 10/30/20 0527  AMMONIA 40* 46* 33 38*   Coagulation Profile: No results for input(s): INR, PROTIME in the last 168 hours. Cardiac Enzymes: No results for input(s): CKTOTAL, CKMB, CKMBINDEX, TROPONINI in the last 168 hours. BNP (last 3 results) No results for input(s): PROBNP in the last 8760 hours. HbA1C: No results for input(s): HGBA1C in the last 72 hours. CBG: Recent Labs  Lab 10/29/20 2343 10/30/20 0357 10/30/20 0753 10/30/20 1142 10/30/20 1703  GLUCAP 144* 167* 216*  188* 145*   Lipid Profile: No results for input(s): CHOL, HDL, LDLCALC, TRIG, CHOLHDL, LDLDIRECT in the last 72 hours. Thyroid Function Tests: Recent Labs    10/29/20 0731 10/29/20 1912  TSH  0.192*  --   FREET4  --  1.03   Anemia Panel: No results for input(s): VITAMINB12, FOLATE, FERRITIN, TIBC, IRON, RETICCTPCT in the last 72 hours. Sepsis Labs: No results for input(s): PROCALCITON, LATICACIDVEN in the last 168 hours.  Recent Results (from the past 240 hour(s))  Resp Panel by RT-PCR (Flu A&B, Covid) Nasopharyngeal Swab     Status: Abnormal   Collection Time: 10/21/20 12:03 PM   Specimen: Nasopharyngeal Swab; Nasopharyngeal(NP) swabs in vial transport medium  Result Value Ref Range Status   SARS Coronavirus 2 by RT PCR POSITIVE (A) NEGATIVE Final    Comment: RESULT CALLED TO, READ BACK BY AND VERIFIED WITH: CARDWELL,L AT 1325 BY HUFFINES,S ON 10/21/20 (NOTE) SARS-CoV-2 target nucleic acids are DETECTED.  The SARS-CoV-2 RNA is generally detectable in upper respiratory specimens during the acute phase of infection. Positive results are indicative of the presence of the identified virus, but do not rule out bacterial infection or co-infection with other pathogens not detected by the test. Clinical correlation with patient history and other diagnostic information is necessary to determine patient infection status. The expected result is Negative.  Fact Sheet for Patients: EntrepreneurPulse.com.au  Fact Sheet for Healthcare Providers: IncredibleEmployment.be  This test is not yet approved or cleared by the [place] FDA and  has been authorized for detection and/or diagnosis of SARS-CoV-2 by FDA under an Emergency Use Authorization (EUA).  This EUA will remain in effect (meaning this  test can be used) for the duration of  the COVID-19 declaration under Section 564(b)(1) of the Act, 21 U.S.C. section 360bbb-3(b)(1), unless the  authorization is terminated or revoked sooner.     Influenza A by PCR NEGATIVE NEGATIVE Final   Influenza B by PCR NEGATIVE NEGATIVE Final    Comment: (NOTE) The Xpert Xpress SARS-CoV-2/FLU/RSV plus assay is intended as an aid in the diagnosis of influenza from Nasopharyngeal swab specimens and should not be used as a sole basis for treatment. Nasal washings and aspirates are unacceptable for Xpert Xpress SARS-CoV-2/FLU/RSV testing.  Fact Sheet for Patients: EntrepreneurPulse.com.au  Fact Sheet for Healthcare Providers: IncredibleEmployment.be  This test is not yet approved or cleared by the [place] FDA and has been authorized for detection and/or diagnosis of SARS-CoV-2 by FDA under an Emergency Use Authorization (EUA). This EUA will remain in effect (meaning this test can be used) for the duration of the COVID-19 declaration under Section 564(b)(1) of the Act, 21 U.S.C. section 360bbb-3(b)(1), unless the authorization is terminated or revoked.  Performed at [facility], [address] [place], [place] [address]   Blood Culture (routine x 2)     Status: Abnormal   Collection Time: 10/21/20  2:24 PM   Specimen: BLOOD  Result Value Ref Range Status   Specimen Description   Final    BLOOD RIGHT ANTECUBITAL Performed at [facility], [address] [place], [place] [address]    Special Requests   Final    BOTTLES DRAWN AEROBIC AND ANAEROBIC Blood Culture adequate volume Performed at [facility], [address], [place], [place] [address]    Culture  Setup Time   Final    AEROBIC BOTTLE ONLY GRAM POSITIVE COCCI Gram Stain Report Called to,Read Back By and Verified With: S BOLES,RN@2119  10/22/20 MKELLY Organism ID to follow    Culture (A)  Final    MICROCOCCUS SPECIES Standardized susceptibility testing for this organism is not available. Performed at [facility], [place] [address], [place], [place] [address]  Report  Status 10/24/2020 FINAL  Final  Blood Culture ID Panel (Reflexed)     Status: None   Collection Time: 10/21/20  2:24 PM  Result Value Ref Range Status   Enterococcus faecalis NOT DETECTED NOT DETECTED Final   Enterococcus Faecium NOT DETECTED NOT DETECTED Final   Listeria monocytogenes NOT DETECTED NOT DETECTED Final   Staphylococcus species NOT DETECTED NOT DETECTED Final   Staphylococcus aureus (BCID) NOT DETECTED NOT DETECTED Final   Staphylococcus epidermidis NOT DETECTED NOT DETECTED Final   Staphylococcus lugdunensis NOT DETECTED NOT DETECTED Final   Streptococcus species NOT DETECTED NOT DETECTED Final   Streptococcus agalactiae NOT DETECTED NOT DETECTED Final   Streptococcus pneumoniae NOT DETECTED NOT DETECTED Final   Streptococcus pyogenes NOT DETECTED NOT DETECTED Final   A.calcoaceticus-baumannii NOT DETECTED NOT DETECTED Final   Bacteroides fragilis NOT DETECTED NOT DETECTED Final   Enterobacterales NOT DETECTED NOT DETECTED Final   Enterobacter cloacae complex NOT DETECTED NOT DETECTED Final   Escherichia coli NOT DETECTED NOT DETECTED Final   Klebsiella aerogenes NOT DETECTED NOT DETECTED Final   Klebsiella oxytoca NOT DETECTED NOT DETECTED Final   Klebsiella pneumoniae NOT DETECTED NOT DETECTED Final   Proteus species NOT DETECTED NOT DETECTED Final   Salmonella species NOT DETECTED NOT DETECTED Final   Serratia marcescens NOT DETECTED NOT DETECTED Final   Haemophilus influenzae NOT DETECTED NOT DETECTED Final   Neisseria meningitidis NOT DETECTED NOT DETECTED Final   Pseudomonas aeruginosa NOT DETECTED NOT DETECTED Final   Stenotrophomonas maltophilia NOT DETECTED NOT DETECTED Final   Candida albicans NOT DETECTED NOT DETECTED Final   Candida auris NOT DETECTED NOT DETECTED Final   Candida glabrata NOT DETECTED NOT DETECTED Final   Candida krusei NOT DETECTED NOT DETECTED Final   Candida parapsilosis NOT DETECTED NOT DETECTED Final   Candida tropicalis NOT DETECTED  NOT DETECTED Final   Cryptococcus neoformans/gattii NOT DETECTED NOT DETECTED Final    Comment: Performed at [facility], [address], [place], [place] [address]  Blood Culture (routine x 2)     Status: Abnormal   Collection Time: 10/21/20  3:25 PM   Specimen: BLOOD  Result Value Ref Range Status   Specimen Description   Final    BLOOD LEFT ANTECUBITAL Performed at [facility], [address] [place], [place] [address]    Special Requests   Final    BOTTLES DRAWN AEROBIC AND ANAEROBIC Blood Culture adequate volume Performed at [facility], [address], [place], [place] [address]    Culture  Setup Time   Final    GRAM POSITIVE RODS Gram Stain Report Called to,Read Back By and Verified With: TIFFANY SMITH @1624  10/26/20 BY JONES,T ANAEROBIC BOTTLE ONLY [facility] Performed at [facility], [address], [place], [place] [address]    Culture (A)  Final    PROPIONIBACTERIUM ACNES Standardized susceptibility testing for this organism is not available. Performed at [facility], [place] [address], [place], [place] [address]    Report Status 10/28/2020 FINAL  Final  MRSA PCR Screening     Status: None   Collection Time: 10/23/20 10:15 AM   Specimen: Nasopharyngeal  Result Value Ref Range Status   MRSA by PCR NEGATIVE NEGATIVE Final    Comment:        The GeneXpert MRSA Assay (FDA approved for NASAL specimens only), is one component of a comprehensive MRSA colonization surveillance program. It is not intended to diagnose MRSA infection nor to guide  or monitor treatment for MRSA infections. Performed at [facility], [address], [place], [place] [address]   Culture, blood (routine x 2)     Status: None (Preliminary result)   Collection Time: 10/27/20  8:55 AM   Specimen: BLOOD LEFT HAND  Result Value Ref Range Status   Specimen Description   Final    BLOOD LEFT HAND BOTTLES DRAWN AEROBIC AND ANAEROBIC   Special Requests Blood Culture adequate volume  Final    Culture   Final    NO GROWTH 3 DAYS Performed at [facility], [address], [place], [place] [address]    Report Status PENDING  Incomplete  Culture, blood (routine x 2)     Status: None (Preliminary result)   Collection Time: 10/27/20  8:58 AM   Specimen: BLOOD LEFT ARM  Result Value Ref Range Status   Specimen Description BLOOD LEFT ARM BOTTLES DRAWN AEROBIC AND ANAEROBIC  Final   Special Requests Blood Culture adequate volume  Final   Culture   Final    NO GROWTH 3 DAYS Performed at [facility], [address] [place], [place] [address]    Report Status PENDING  Incomplete         Radiology Studies: CT HEAD WO CONTRAST  Result Date: 10/29/2020 CLINICAL DATA:  Mental status change, unknown cause. Altered mental status, COVID positive. EXAM: CT HEAD WITHOUT CONTRAST TECHNIQUE: Contiguous axial images were obtained from the base of the skull through the vertex without intravenous contrast. COMPARISON:  No pertinent prior exams available for comparison. FINDINGS: Brain: Mildly motion degraded exam. Cerebral volume is normal for age. There is no acute intracranial hemorrhage. No demarcated cortical infarct. No extra-axial fluid collection. No evidence of intracranial mass. No midline shift. Vascular: No hyperdense vessel. Atherosclerotic calcifications Skull: Normal. Negative for fracture or focal lesion. Sinuses/Orbits: Visualized orbits show no acute finding. Scattered secretions within the left ethmoid air cells. Moderate mucosal thickening within the right maxillary sinus with associated chronic reactive osteitis. There is full-thickness erosion of the anterior right maxillary sinus wall inferiorly (for instance as seen on series 3, image 3)(series 5, image 23). Other: Partially visualized nasoenteric tube. IMPRESSION: Mildly motion degraded exam. No evidence of acute intracranial abnormality. Moderate mucosal thickening within the right maxillary sinus with associated chronic reactive  osteitis. There is focal full-thickness erosion of the anterior right maxillary sinus wall inferiorly. Mild left ethmoid sinusitis. Electronically Signed   By: Kellie Simmering DO   On: 10/29/2020 11:43        Scheduled Meds: . vitamin C  500 mg Oral Daily  . aspirin EC  81 mg Oral Daily  . baricitinib  2 mg Oral Daily  . Chlorhexidine Gluconate Cloth  6 each Topical Daily  . dexamethasone (DECADRON) injection  6 mg Intravenous Q24H  . feeding supplement (PROSource TF)  45 mL Per Tube BID  . free water  200 mL Per Tube Q4H  . heparin injection (subcutaneous)  7,500 Units Subcutaneous Q8H  . pantoprazole  40 mg Oral Daily  . sodium zirconium cyclosilicate  10 g Per Tube BID  . zinc sulfate  220 mg Oral Daily   Continuous Infusions: . dextrose 10 mL/hr at 10/30/20 1824  . diltiazem (CARDIZEM) infusion 5 mg/hr (10/30/20 1824)  . feeding supplement (VITAL AF 1.2 CAL) 70 mL/hr at 10/30/20 1736     LOS: 9 days    Time spent: 35 minutes    Kathie Dike, MD [facility]  If 7PM-7AM, please  contact night-coverage [email or web address] 10/30/2020, 8:10 PM

## 2020-10-30 NOTE — Progress Notes (Signed)
Palliative: Mr. Petties is lying quietly in bed.  He appears acutely/chronically ill and frail, older than his stated age.  He continues to have limited interaction with staff, will at times follow commands, but does not open eyes or make eye contact.  There is no family at bedside at this time due to visitor restrictions.  Call to daughter, [name]. We talk about visitor policy.  [name] states that family will come to visit, rotate visitors.  We talked about the treatment plan in detail including, but not limited to, tube feeding and administration of medications, selected labs, medications in general and those for Covid.  [name] states that her father has had a lot of anxiety in his life, but he has never taken antianxiety medicines.  She questions the need for further scheduled Ativan.     [name] states that Mr. Hertenstein's 4 children got together yesterday afternoon for code status discussions.  [name] states that all agree, that if heart stops, no CPR.  If breathing worsens, family agrees to limited time trial of ventilation, but not long term life support.   Conference with attending, bedside nursing staff, transition of care team related to patient condition, needs, goals of care.  Plan:   Continue to treat the treatable, ok for intubation but no chest compressions.  Family is planning to visit.       67 minutes  Quinn Axe, NP Palliative medicine team Team phone [phone] Greater than 50% of this time was spent counseling and coordinating care related to the above assessment and plan.

## 2020-10-31 DIAGNOSIS — J9621 Acute and chronic respiratory failure with hypoxia: Secondary | ICD-10-CM | POA: Diagnosis not present

## 2020-10-31 DIAGNOSIS — U071 COVID-19: Secondary | ICD-10-CM | POA: Diagnosis not present

## 2020-10-31 DIAGNOSIS — Z515 Encounter for palliative care: Secondary | ICD-10-CM | POA: Diagnosis not present

## 2020-10-31 DIAGNOSIS — Z7189 Other specified counseling: Secondary | ICD-10-CM | POA: Diagnosis not present

## 2020-10-31 LAB — COMPREHENSIVE METABOLIC PANEL
ALT: 89 U/L — ABNORMAL HIGH (ref 0–44)
AST: 38 U/L (ref 15–41)
Albumin: 2.2 g/dL — ABNORMAL LOW (ref 3.5–5.0)
Alkaline Phosphatase: 31 U/L — ABNORMAL LOW (ref 38–126)
Anion gap: 10 (ref 5–15)
BUN: 126 mg/dL — ABNORMAL HIGH (ref 8–23)
CO2: 25 mmol/L (ref 22–32)
Calcium: 7.7 mg/dL — ABNORMAL LOW (ref 8.9–10.3)
Chloride: 107 mmol/L (ref 98–111)
Creatinine, Ser: 2.64 mg/dL — ABNORMAL HIGH (ref 0.61–1.24)
GFR, Estimated: 25 mL/min — ABNORMAL LOW (ref >60)
Glucose, Bld: 167 mg/dL — ABNORMAL HIGH (ref 70–99)
Potassium: 4 mmol/L (ref 3.5–5.1)
Sodium: 142 mmol/L (ref 135–145)
Total Bilirubin: 0.7 mg/dL (ref 0.3–1.2)
Total Protein: 5.8 g/dL — ABNORMAL LOW (ref 6.5–8.1)

## 2020-10-31 LAB — BLOOD GAS, ARTERIAL
Acid-Base Excess: 0.8 mmol/L (ref 0.0–2.0)
Bicarbonate: 24.2 mmol/L (ref 20.0–28.0)
FIO2: 21
O2 Saturation: 87.2 %
Patient temperature: 36.3
pCO2 arterial: 49.4 mmHg — ABNORMAL HIGH (ref 32.0–48.0)
pH, Arterial: 7.339 — ABNORMAL LOW (ref 7.350–7.450)
pO2, Arterial: 62 mmHg — ABNORMAL LOW (ref 83.0–108.0)

## 2020-10-31 LAB — GLUCOSE, CAPILLARY
Glucose-Capillary: 153 mg/dL — ABNORMAL HIGH (ref 70–99)
Glucose-Capillary: 186 mg/dL — ABNORMAL HIGH (ref 70–99)
Glucose-Capillary: 184 mg/dL — ABNORMAL HIGH (ref 70–99)
Glucose-Capillary: 137 mg/dL — ABNORMAL HIGH (ref 70–99)
Glucose-Capillary: 123 mg/dL — ABNORMAL HIGH (ref 70–99)

## 2020-10-31 LAB — OCCULT BLOOD X 1 CARD TO LAB, STOOL: Fecal Occult Bld: POSITIVE — AB

## 2020-10-31 LAB — CBC
HCT: 26.5 % — ABNORMAL LOW (ref 39.0–52.0)
Hemoglobin: 7.8 g/dL — ABNORMAL LOW (ref 13.0–17.0)
MCH: 29.1 pg (ref 26.0–34.0)
MCHC: 29.4 g/dL — ABNORMAL LOW (ref 30.0–36.0)
MCV: 98.9 fL (ref 80.0–100.0)
Platelets: 235 10*3/uL (ref 150–400)
RBC: 2.68 MIL/uL — ABNORMAL LOW (ref 4.22–5.81)
RDW: 13.6 % (ref 11.5–15.5)
WBC: 12.6 10*3/uL — ABNORMAL HIGH (ref 4.0–10.5)
nRBC: 0.4 % — ABNORMAL HIGH (ref 0.0–0.2)

## 2020-10-31 MED ORDER — HEPARIN SODIUM (PORCINE) 5000 UNIT/ML IJ SOLN
5000.0000 [IU] | Freq: Three times a day (TID) | INTRAMUSCULAR | Status: DC
  Administered 2020-10-31: 5000 [IU] via SUBCUTANEOUS
  Filled 2020-10-31: qty 1

## 2020-10-31 MED ORDER — PANTOPRAZOLE SODIUM 40 MG IV SOLR
40.0000 mg | Freq: Two times a day (BID) | INTRAVENOUS | Status: DC
  Administered 2020-11-01 – 2020-11-11 (×21): 40 mg via INTRAVENOUS
  Filled 2020-10-31 (×21): qty 40

## 2020-10-31 MED ORDER — CARVEDILOL 3.125 MG PO TABS
6.2500 mg | ORAL_TABLET | Freq: Two times a day (BID) | ORAL | Status: DC
  Administered 2020-10-31 – 2020-11-02 (×4): 6.25 mg
  Filled 2020-10-31 (×4): qty 2

## 2020-10-31 NOTE — Progress Notes (Signed)
Pt pulled out NG himself, Dr. Roderic Palau made aware and stated it was ok to keep out since he is starting to have some oral intake. Pt tolerating ice chips and soft foods fair. Pt also taken off 1L of oxygen and O2 sat's are doing well at 94% and above. Will continue to monitor and pass on to night shift RN.

## 2020-10-31 NOTE — Progress Notes (Signed)
Per Dr. Roderic Palau, he wanted me to try and give the patient ice chips and small bites of applesauce so his NG tube can potentially come out and the patient can take PO meds now. Pt tolerated both of those fair. MD stated to clamp tube and turn off tube feeding. If he tolerates a meal, then NG tube can be removed. Will continue to monitor and update MD as necessary.

## 2020-10-31 NOTE — Progress Notes (Signed)
PROGRESS NOTE    MARCELLO Burke  QIH:[id number] DOB: [birth year] DOA: 10/21/2020 PCP: Rosita Fire, MD   Brief Narrative:  Darren Burke a 75 y.o.malewith medical history significantfor longtimetobacco use, COPD(emphysema),GERD,stage IV CKD, chronic constipation, hypertension, paroxysmal atrial flutter who presented to the emergency department from PCP office with shortness of breath and hypoxia. He was noted to have a pulse ox of 80 to 81% on room air. Patient was admitted with acute hypoxemic respiratory failure secondary to Covid pneumonia and is unvaccinated. He and his family were unsure of starting remdesivir, but would now like to start this medication and therefore it will be initiated 12/28. His hypoxemiais currently improved to 1 L nasal cannula oxygen. He is having worsening electrolyte abnormalities and appears to be catatonic on 1/2, but is improving.  He was noted to have low blood pressure readings overnight on 1/3 and continues to have significant hyponatremia and hypokalemia.  NG tube to be placed for free water flushes and feedings.  CT head with no acute findings.  ABG with hypercapnia noted with BiPAP started.  Also noted to be in atrial fibrillation with RVR and started on Cardizem drip.   Assessment & Plan:   Principal Problem:   Pneumonia due to COVID-19 virus Active Problems:   Pulmonary emphysema ([place])   COPD with acute exacerbation (HCC)   SOB (shortness of breath)   Hypoxia   Acute and chronic respiratory failure with hypoxia (HCC)   HTN (hypertension)   Paroxysmal atrial flutter (HCC)   Anemia   CKD (chronic kidney disease), stage IV (HCC)   Transaminitis   1. Acute respiratory failure with hypoxia secondary to Covid pneumonia-patient is unvaccinated. He has underlying chronic lung disease and other co-morbidities. He remains HighRisk for adverse outcomeandseverelungparenchymal injury. He and his family initially refused the use of  remdesivir;thenon 10/22/2020 accepted to be treated along with the use of steroids. Patient with good response to the initiation of barcitiniband currently down to just1L high flow nasal cannula supplementation. Will continue current management, supportive care and continue to wean off supplementation as tolerated. Has completed remdesivir infusion. 2. Acute encephalopathy-multifactorial.  Family reports that he may have some baseline cognitive deficits and early signs of dementia at baseline.  CT head found to be negative.  Suspect that worsening mental status is related to acute infectious process/metabolic issues.  Limit sedating medications.  Overall mental status appears to be improving, he is more alert and interactive, although not back to baseline yet 3. 1 set blood culture positive for micrococcus likely contaminant.  Repeat blood cultures negative.  Plan to discontinue Rocephin. 4. Low TSH.  Free T4 is normal.  Repeat labs in 3 to 4 weeks as an outpatient 5. Chronic stage IV kidney disease -his creatinine is stable overall and will continue following trend. Continue minimizing nephrotoxic agents and avoiding hypotension.  Patient has markedly elevated BUN.  His FOBT is also positive.  Concern for GI bleeding. 6. GI bleeding.  Noted to have FOBT positive and is having dark stools.  BUN is markedly elevated.  Aspirin and heparin have been discontinued. Will consult GI 7. COPD with acute exacerbation secondary to Covid infection treating with IV steroids and bronchodilators as mentioned above.  Overall respiratory status has stabilized and steroids have been discontinued 8. Anemia in CKD-follow CBC closely; patient is noted to have dark stools today which were heme positive.  Continue to follow hemoglobin and transfuse as needed 9. Paroxysmal atrial flutter-currently with RVR.  Started  on Cardizem drip for rate control.  Will restart on carvedilol in efforts to wean off Cardizem.  Chadsvasc  score of 2 for hypertension and age.  Will need to discuss with family risk/benefits of anticoagulation. 10. Chronic constipation - laxative therapy ordered. Follow response and further improve bowel management as required. 11. Transaminitis - likely reactive from current Covid infection. Currently stable.  Continue to monitor. 12. Hypernatremia/hyperkalemia: Improved after receiving hypotonic fluids and Lokelma.  Continue to monitor  DVT prophylaxis: Lovenox Code Status:Full Family Communication:Discussed with daughterat the bedside 1/6 Disposition Plan: Status is: Inpatient  Remains inpatient appropriate because:IV treatments appropriate due to intensity of illness or inability to take PO and Inpatient level of care appropriate due to severity of illness   Dispo: The patient is from: Home Anticipated d/c is to: TBD Anticipated d/c date is: 3 daysor so. Patient currently is not medically stable to d/c.Patient will be continued on steroids and Barcitinib. Has completed Remdesivir infusion. Will provide gentle fluid resuscitation to assist with hypernatremia anddiscontinue use of Seroquel.  Now with NG tube feedings and free water flushes.  Continue to monitor electrolytes. On Cardizem drip for A. fib with RVR.   Consultants:  Palliative care  Procedures:  NGT 1/3>1/6  See below; CT head 1/4 with no significant findings  Antimicrobials:  Anti-infectives (From admission, onward)   Start     Dose/Rate Route Frequency Ordered Stop   10/26/20 1730  cefTRIAXone (ROCEPHIN) 2 g in sodium chloride 0.9 % 100 mL IVPB  Status:  Discontinued        2 g 200 mL/hr over 30 Minutes Intravenous Every 24 hours 10/26/20 1636 10/29/20 0942   10/23/20 1000  remdesivir 100 mg in sodium chloride 0.9 % 100 mL IVPB       "Followed by" Linked Group Details   100 mg 200 mL/hr over 30 Minutes Intravenous Daily 10/22/20 1154 10/26/20 1128    10/22/20 1200  remdesivir 100 mg in sodium chloride 0.9 % 100 mL IVPB       "Followed by" Linked Group Details   100 mg 200 mL/hr over 30 Minutes Intravenous Every 30 min 10/22/20 1154 10/22/20 1436   10/22/20 1000  remdesivir 100 mg in sodium chloride 0.9 % 100 mL IVPB  Status:  Discontinued        100 mg 200 mL/hr over 30 Minutes Intravenous Daily 10/21/20 1353 10/21/20 1418   10/21/20 1430  remdesivir 100 mg in sodium chloride 0.9 % 100 mL IVPB  Status:  Discontinued        100 mg 200 mL/hr over 30 Minutes Intravenous Every 1 hr x 2 10/21/20 1353 10/21/20 1418      Subjective: Patient appears to be more awake today.  He is trying to answer some questions, although still appears to be somewhat disoriented.  He is not lethargic as he was yesterday.  Objective: Vitals:   10/31/20 1730 10/31/20 1800 10/31/20 1830 10/31/20 1900  BP: 119/70 133/79 136/73 119/69  Pulse: (!) 44 (!) 50 (!) 44   Resp: (!) 22 (!) 27 (!) 22 18  Temp:      TempSrc:      SpO2: 98% 94% (!) 88%   Weight:      Height:        Intake/Output Summary (Last 24 hours) at 10/31/2020 1927 Last data filed at 10/31/2020 1600 Gross per 24 hour  Intake 312.23 ml  Output 3100 ml  Net -2787.77 ml   [employer]  10/29/20 0537 10/30/20 0501 10/31/20 0504  Weight: 97.5 kg 99.2 kg 100.6 kg    Examination:  General exam: Opens eyes to voice, interacting with daughter Respiratory system: Clear to auscultation. Respiratory effort normal. Cardiovascular system:RRR. No murmurs, rubs, gallops. Gastrointestinal system: Abdomen is nondistended, soft and nontender. No organomegaly or masses felt. Normal bowel sounds heard. NG tube in place Central nervous system: No focal neurological deficits. Extremities: No C/C/E, +pedal pulses Skin: No rashes, lesions or ulcers Psychiatry: He is more interactive today, still appears to be confused     Data Reviewed: I have personally reviewed following labs and imaging  studies  CBC: Recent Labs  Lab 10/25/20 0646 10/26/20 0537 10/28/20 0420 10/29/20 0731 10/30/20 0527 10/31/20 0429  WBC 11.5* 11.4* 11.8* 11.8* 11.8* 12.6*  NEUTROABS 9.8* 9.7*  --   --   --   --   HGB 10.7* 10.9* 10.1* 8.8* 8.4* 7.8*  HCT 35.5* 37.7* 35.5* 30.3* 29.3* 26.5*  MCV 100.3* 102.7* 105.0* 104.5* 103.5* 98.9  PLT 303 270 225 217 221 629   Basic Metabolic Panel: Recent Labs  Lab 10/25/20 0646 10/26/20 0429 10/26/20 1316 10/27/20 1545 10/28/20 0420 10/28/20 1154 10/28/20 2033 10/29/20 0456 10/29/20 0731 10/29/20 1912 10/30/20 0527 10/31/20 0429  NA 151* 156*   < > 155* 157*  --   --   --  148*  --  142 142  K 5.2* 5.7*   < > 5.7* 5.6*  --   --   --  5.4*  --  4.7 4.0  CL 115* 119*   < > 120* 121*  --   --   --  114*  --  108 107  CO2 28 28   < > 28 27  --   --   --  24  --  24 25  GLUCOSE 124* 123*   < > 145* 163*  --   --   --  244*  --  230* 167*  BUN 88* 94*   < > 109* 114*  --   --   --  119*  --  112* 126*  CREATININE 2.49* 2.63*   < > 2.95* 2.87*  --   --   --  2.93*  --  2.84* 2.64*  CALCIUM 8.6* 8.8*   < > 8.6* 8.2*  --   --   --  8.2*  --  7.7* 7.7*  MG 3.1* 3.1*  --   --  2.9*  --   --   --   --   --  2.5*  --   PHOS 5.3* 5.6*  --   --   --  3.6 4.0 3.7  --  3.9  --   --    < > = values in this interval not displayed.   GFR: Estimated Creatinine Clearance: 28.6 mL/min (A) (by C-G formula based on SCr of 2.64 mg/dL (H)). Liver Function Tests: Recent Labs  Lab 10/26/20 0429 10/28/20 0420 10/29/20 0731 10/30/20 0527 10/31/20 0429  AST 51* 35 48* 34 38  ALT 71* 65* 87* 81* 89*  ALKPHOS 36* 35* 38 34* 31*  BILITOT 0.9 0.8 0.7 0.7 0.7  PROT 6.8 6.2* 6.2* 5.8* 5.8*  ALBUMIN 2.7* 2.2* 2.1* 2.1* 2.2*   No results for input(s): LIPASE, AMYLASE in the last 168 hours. Recent Labs  Lab 10/27/20 0511 10/28/20 0420 10/29/20 0731 10/30/20 0527  AMMONIA 40* 46* 33 38*   Coagulation Profile: No results for input(s): INR,  PROTIME in the last 168  hours. Cardiac Enzymes: No results for input(s): CKTOTAL, CKMB, CKMBINDEX, TROPONINI in the last 168 hours. BNP (last 3 results) No results for input(s): PROBNP in the last 8760 hours. HbA1C: No results for input(s): HGBA1C in the last 72 hours. CBG: Recent Labs  Lab 10/30/20 2216 10/31/20 0444 10/31/20 0825 10/31/20 1156 10/31/20 1635  GLUCAP 131* 153* 186* 184* 137*   Lipid Profile: No results for input(s): CHOL, HDL, LDLCALC, TRIG, CHOLHDL, LDLDIRECT in the last 72 hours. Thyroid Function Tests: Recent Labs    10/29/20 0731 10/29/20 1912  TSH 0.192*  --   FREET4  --  1.03   Anemia Panel: No results for input(s): VITAMINB12, FOLATE, FERRITIN, TIBC, IRON, RETICCTPCT in the last 72 hours. Sepsis Labs: No results for input(s): PROCALCITON, LATICACIDVEN in the last 168 hours.  Recent Results (from the past 240 hour(s))  MRSA PCR Screening     Status: None   Collection Time: 10/23/20 10:15 AM   Specimen: Nasopharyngeal  Result Value Ref Range Status   MRSA by PCR NEGATIVE NEGATIVE Final    Comment:        The GeneXpert MRSA Assay (FDA approved for NASAL specimens only), is one component of a comprehensive MRSA colonization surveillance program. It is not intended to diagnose MRSA infection nor to guide or monitor treatment for MRSA infections. Performed at [facility], [address], [place], [place] [address]   Culture, blood (routine x 2)     Status: None (Preliminary result)   Collection Time: 10/27/20  8:55 AM   Specimen: BLOOD LEFT HAND  Result Value Ref Range Status   Specimen Description   Final    BLOOD LEFT HAND BOTTLES DRAWN AEROBIC AND ANAEROBIC   Special Requests Blood Culture adequate volume  Final   Culture   Final    NO GROWTH 4 DAYS Performed at [facility], [address] [place], [place] [address]    Report Status PENDING  Incomplete  Culture, blood (routine x 2)     Status: None (Preliminary result)   Collection Time: 10/27/20  8:58 AM    Specimen: BLOOD LEFT ARM  Result Value Ref Range Status   Specimen Description BLOOD LEFT ARM BOTTLES DRAWN AEROBIC AND ANAEROBIC  Final   Special Requests Blood Culture adequate volume  Final   Culture   Final    NO GROWTH 4 DAYS Performed at [facility], [place], [address] [place], [place] [address]    Report Status PENDING  Incomplete         Radiology Studies: No results found.      Scheduled Meds: . vitamin C  500 mg Oral Daily  . baricitinib  2 mg Oral Daily  . carvedilol  6.25 mg Per Tube BID WC  . Chlorhexidine Gluconate Cloth  6 each Topical Daily  . feeding supplement (PROSource TF)  45 mL Per Tube BID  . free water  200 mL Per Tube Q4H  . pantoprazole (PROTONIX) IV  40 mg Intravenous Q12H  . sodium zirconium cyclosilicate  10 g Per Tube BID  . zinc sulfate  220 mg Oral Daily   Continuous Infusions: . dextrose 10 mL/hr at 10/31/20 1513  . feeding supplement (VITAL AF 1.2 CAL) Stopped (10/31/20 1415)     LOS: 10 days    Time spent: 71 minutes    Kathie Dike, MD [facility]  If 7PM-7AM, please contact night-coverage [email or web address] 10/31/2020, 7:27 PM

## 2020-11-01 DIAGNOSIS — N184 Chronic kidney disease, stage 4 (severe): Secondary | ICD-10-CM | POA: Diagnosis not present

## 2020-11-01 DIAGNOSIS — U071 COVID-19: Secondary | ICD-10-CM | POA: Diagnosis not present

## 2020-11-01 DIAGNOSIS — J9621 Acute and chronic respiratory failure with hypoxia: Secondary | ICD-10-CM | POA: Diagnosis not present

## 2020-11-01 DIAGNOSIS — J441 Chronic obstructive pulmonary disease with (acute) exacerbation: Secondary | ICD-10-CM | POA: Diagnosis not present

## 2020-11-01 DIAGNOSIS — K922 Gastrointestinal hemorrhage, unspecified: Secondary | ICD-10-CM

## 2020-11-01 DIAGNOSIS — Z515 Encounter for palliative care: Secondary | ICD-10-CM | POA: Diagnosis not present

## 2020-11-01 DIAGNOSIS — K921 Melena: Secondary | ICD-10-CM

## 2020-11-01 DIAGNOSIS — Z7189 Other specified counseling: Secondary | ICD-10-CM | POA: Diagnosis not present

## 2020-11-01 LAB — CULTURE, BLOOD (ROUTINE X 2)
Culture: NO GROWTH
Culture: NO GROWTH
Special Requests: ADEQUATE
Special Requests: ADEQUATE

## 2020-11-01 LAB — BASIC METABOLIC PANEL
Anion gap: 10 (ref 5–15)
BUN: 118 mg/dL — ABNORMAL HIGH (ref 8–23)
CO2: 26 mmol/L (ref 22–32)
Calcium: 8.2 mg/dL — ABNORMAL LOW (ref 8.9–10.3)
Chloride: 111 mmol/L (ref 98–111)
Creatinine, Ser: 2.19 mg/dL — ABNORMAL HIGH (ref 0.61–1.24)
GFR, Estimated: 31 mL/min — ABNORMAL LOW (ref >60)
Glucose, Bld: 126 mg/dL — ABNORMAL HIGH (ref 70–99)
Potassium: 3.9 mmol/L (ref 3.5–5.1)
Sodium: 147 mmol/L — ABNORMAL HIGH (ref 135–145)

## 2020-11-01 LAB — GLUCOSE, CAPILLARY
Glucose-Capillary: 111 mg/dL — ABNORMAL HIGH (ref 70–99)
Glucose-Capillary: 110 mg/dL — ABNORMAL HIGH (ref 70–99)
Glucose-Capillary: 119 mg/dL — ABNORMAL HIGH (ref 70–99)
Glucose-Capillary: 125 mg/dL — ABNORMAL HIGH (ref 70–99)
Glucose-Capillary: 108 mg/dL — ABNORMAL HIGH (ref 70–99)

## 2020-11-01 LAB — CBC
HCT: 26 % — ABNORMAL LOW (ref 39.0–52.0)
Hemoglobin: 8 g/dL — ABNORMAL LOW (ref 13.0–17.0)
MCH: 30.1 pg (ref 26.0–34.0)
MCHC: 30.8 g/dL (ref 30.0–36.0)
MCV: 97.7 fL (ref 80.0–100.0)
Platelets: 260 10*3/uL (ref 150–400)
RBC: 2.66 MIL/uL — ABNORMAL LOW (ref 4.22–5.81)
RDW: 13.9 % (ref 11.5–15.5)
WBC: 10.3 10*3/uL (ref 4.0–10.5)
nRBC: 0.3 % — ABNORMAL HIGH (ref 0.0–0.2)

## 2020-11-01 MED ORDER — PROSOURCE PLUS PO LIQD
30.0000 mL | Freq: Two times a day (BID) | ORAL | Status: DC
  Administered 2020-11-02 – 2020-11-12 (×18): 30 mL via ORAL
  Filled 2020-11-01 (×16): qty 30

## 2020-11-01 MED ORDER — DILTIAZEM HCL 60 MG PO TABS
60.0000 mg | ORAL_TABLET | Freq: Three times a day (TID) | ORAL | Status: DC
  Administered 2020-11-01 – 2020-11-02 (×3): 60 mg via ORAL
  Filled 2020-11-01 (×3): qty 1

## 2020-11-01 MED ORDER — ENSURE ENLIVE PO LIQD
237.0000 mL | Freq: Three times a day (TID) | ORAL | Status: DC
  Administered 2020-11-02 – 2020-11-12 (×25): 237 mL via ORAL

## 2020-11-01 NOTE — Progress Notes (Signed)
PROGRESS NOTE    Darren Burke  GUY:[id number] DOB: 09/23/1946 DOA: 10/21/2020 PCP: Rosita Fire, MD   Brief Narrative:  [name] a 75 y.o.malewith medical history significantfor longtimetobacco use, COPD(emphysema),GERD,stage IV CKD, chronic constipation, hypertension, paroxysmal atrial flutter who presented to the emergency department from PCP office with shortness of breath and hypoxia. He was noted to have a pulse ox of 80 to 81% on room air. Patient was admitted with acute hypoxemic respiratory failure secondary to Covid pneumonia and is unvaccinated. He and his family were unsure of starting remdesivir, but would now like to start this medication and therefore it will be initiated 12/28. His hypoxemiais currently improved to 1 L nasal cannula oxygen. He is having worsening electrolyte abnormalities and appears to be catatonic on 1/2, but is improving.  He was noted to have low blood pressure readings overnight on 1/3 and continues to have significant hyponatremia and hypokalemia.  NG tube to be placed for free water flushes and feedings.  CT head with no acute findings.  ABG with hypercapnia noted with BiPAP started.  Also noted to be in atrial fibrillation with RVR and started on Cardizem drip.   Assessment & Plan:   Principal Problem:   Pneumonia due to COVID-19 virus Active Problems:   Pulmonary emphysema ([place])   COPD with acute exacerbation (HCC)   SOB (shortness of breath)   Hypoxia   Acute and chronic respiratory failure with hypoxia (HCC)   HTN (hypertension)   Paroxysmal atrial flutter (HCC)   Anemia   CKD (chronic kidney disease), stage IV (HCC)   Transaminitis   Melena   Gastrointestinal hemorrhage   1. Acute respiratory failure with hypoxia secondary to Covid pneumonia-patient is unvaccinated. He has underlying chronic lung disease and other co-morbidities. He remains HighRisk for adverse outcomeandseverelungparenchymal injury. He and  his family initially refused the use of remdesivir;thenon 10/22/2020 accepted to be treated along with the use of steroids. Patient with good response to the initiation of barcitiniband currently down to just1L high flow nasal cannula supplementation. Will continue current management, supportive care and continue to wean off supplementation as tolerated. Has completed remdesivir infusion. 2. Acute encephalopathy-multifactorial.  Family reports that he may have some baseline cognitive deficits and early signs of dementia at baseline.  CT head found to be negative.  Suspect that worsening mental status is related to acute infectious process/metabolic issues.  Limit sedating medications.  Overall mental status appears to be improving, he is more alert and interactive, although not back to baseline yet 3. 1 set blood culture positive for micrococcus likely contaminant.  Repeat blood cultures negative.  Rocephin was discontinued 4. Low TSH.  Free T4 is normal.  Repeat labs in 3 to 4 weeks as an outpatient 5. Chronic stage IV kidney disease -his creatinine is stable overall and will continue following trend. Continue minimizing nephrotoxic agents and avoiding hypotension.  Patient has markedly elevated BUN.  His FOBT is also positive.  Concern for GI bleeding. 6. GI bleeding.  Noted to have FOBT positive and dark stools.  BUN is markedly elevated.  Aspirin and heparin have been discontinued. GI consulted. He is on PPI 7. COPD with acute exacerbation secondary to Covid infection treated with IV steroids and bronchodilators as mentioned above.  Overall respiratory status has stabilized and steroids have been discontinued 8. Anemia in CKD-follow CBC closely; patient is noted to have dark stools which were heme positive.  Continue to follow hemoglobin and transfuse as needed 9. Paroxysmal  atrial flutter-currently with RVR.  Started on Cardizem drip for rate control.  He is also on carvedilol for rate control.  Will add oral diltiazem in the hopes of taking him off cardizem drip.  Chadsvasc score of 2 for hypertension and age.  Not a candidate for anticoagulation at this time due to concerns for GI bleeding. 10. Chronic constipation - laxative therapy ordered. Follow response and further improve bowel management as required. 11. Transaminitis - likely reactive from current Covid infection. Currently stable.  Continue to monitor. 12. Hypernatremia/hyperkalemia: Improved after receiving hypotonic fluids and Lokelma.  Continue to monitor  DVT prophylaxis: SCDs Code Status:Full Family Communication:Discussed with daughterat the bedside 1/7 Disposition Plan: Status is: Inpatient  Remains inpatient appropriate because:IV treatments appropriate due to intensity of illness or inability to take PO and Inpatient level of care appropriate due to severity of illness   Dispo: The patient is from: Home Anticipated d/c is to: TBD Anticipated d/c date is: 3 daysor so. Patient currently is not medically stable to d/c.Patient will be continued on steroids and Barcitinib. Has completed Remdesivir infusion. Will provide gentle fluid resuscitation to assist with hypernatremia anddiscontinue use of Seroquel.  Now with NG tube feedings and free water flushes.  Continue to monitor electrolytes. On Cardizem drip for A. fib with RVR.   Consultants:  Palliative care  Gastroenterology  Procedures:  NGT 1/3>1/6  See below; CT head 1/4 with no significant findings  Antimicrobials:  Anti-infectives (From admission, onward)   Start     Dose/Rate Route Frequency Ordered Stop   10/26/20 1730  cefTRIAXone (ROCEPHIN) 2 g in sodium chloride 0.9 % 100 mL IVPB  Status:  Discontinued        2 g 200 mL/hr over 30 Minutes Intravenous Every 24 hours 10/26/20 1636 10/29/20 0942   10/23/20 1000  remdesivir 100 mg in sodium chloride 0.9 % 100 mL IVPB       "Followed by"  Linked Group Details   100 mg 200 mL/hr over 30 Minutes Intravenous Daily 10/22/20 1154 10/26/20 1128   10/22/20 1200  remdesivir 100 mg in sodium chloride 0.9 % 100 mL IVPB       "Followed by" Linked Group Details   100 mg 200 mL/hr over 30 Minutes Intravenous Every 30 min 10/22/20 1154 10/22/20 1436   10/22/20 1000  remdesivir 100 mg in sodium chloride 0.9 % 100 mL IVPB  Status:  Discontinued        100 mg 200 mL/hr over 30 Minutes Intravenous Daily 10/21/20 1353 10/21/20 1418   10/21/20 1430  remdesivir 100 mg in sodium chloride 0.9 % 100 mL IVPB  Status:  Discontinued        100 mg 200 mL/hr over 30 Minutes Intravenous Every 1 hr x 2 10/21/20 1353 10/21/20 1418      Subjective: Patient is more awake today. He knows he is in the hospital, but does not know why he is in the hospital. Denies any shortness of breath  Objective: Vitals:   11/01/20 0100 11/01/20 0200 11/01/20 0300 11/01/20 0823  BP: 132/69 (!) 144/70 (!) 149/66   Pulse: (!) 37 (!) 46 (!) 29   Resp: (!) 24 20 (!) 22   Temp:    (!) 96.9 F (36.1 C)  TempSrc:    Axillary  SpO2: 99% 100% 96%   Weight:      Height:        Intake/Output Summary (Last 24 hours) at 11/01/2020 1210 Last data filed at  10/31/2020 1600 Gross per 24 hour  Intake 141.25 ml  Output 650 ml  Net -508.75 ml   Filed Weights   10/29/20 0537 10/30/20 0501 10/31/20 0504  Weight: 97.5 kg 99.2 kg 100.6 kg    Examination:  General exam: Alert, awake, no distress Respiratory system: Clear to auscultation. Respiratory effort normal. Cardiovascular system:irregular No murmurs, rubs, gallops. Gastrointestinal system: Abdomen is nondistended, soft and nontender. No organomegaly or masses felt. Normal bowel sounds heard. Central nervous system: No focal neurological deficits. Extremities: 1+ edema bilaterally Skin: No rashes, lesions or ulcers Psychiatry: awake, mildly confused    Data Reviewed: I have personally reviewed following labs and  imaging studies  CBC: Recent Labs  Lab 10/26/20 0537 10/28/20 0420 10/29/20 0731 10/30/20 0527 10/31/20 0429 11/01/20 0344  WBC 11.4* 11.8* 11.8* 11.8* 12.6* 10.3  NEUTROABS 9.7*  --   --   --   --   --   HGB 10.9* 10.1* 8.8* 8.4* 7.8* 8.0*  HCT 37.7* 35.5* 30.3* 29.3* 26.5* 26.0*  MCV 102.7* 105.0* 104.5* 103.5* 98.9 97.7  PLT 270 225 217 221 235 454   Basic Metabolic Panel: Recent Labs  Lab 10/26/20 0429 10/26/20 1316 10/28/20 0420 10/28/20 1154 10/28/20 2033 10/29/20 0456 10/29/20 0731 10/29/20 1912 10/30/20 0527 10/31/20 0429 11/01/20 0344  NA 156*   < > 157*  --   --   --  148*  --  142 142 147*  K 5.7*   < > 5.6*  --   --   --  5.4*  --  4.7 4.0 3.9  CL 119*   < > 121*  --   --   --  114*  --  108 107 111  CO2 28   < > 27  --   --   --  24  --  24 25 26   GLUCOSE 123*   < > 163*  --   --   --  244*  --  230* 167* 126*  BUN 94*   < > 114*  --   --   --  119*  --  112* 126* 118*  CREATININE 2.63*   < > 2.87*  --   --   --  2.93*  --  2.84* 2.64* 2.19*  CALCIUM 8.8*   < > 8.2*  --   --   --  8.2*  --  7.7* 7.7* 8.2*  MG 3.1*  --  2.9*  --   --   --   --   --  2.5*  --   --   PHOS 5.6*  --   --  3.6 4.0 3.7  --  3.9  --   --   --    < > = values in this interval not displayed.   GFR: Estimated Creatinine Clearance: 34.5 mL/min (A) (by C-G formula based on SCr of 2.19 mg/dL (H)). Liver Function Tests: Recent Labs  Lab 10/26/20 0429 10/28/20 0420 10/29/20 0731 10/30/20 0527 10/31/20 0429  AST 51* 35 48* 34 38  ALT 71* 65* 87* 81* 89*  ALKPHOS 36* 35* 38 34* 31*  BILITOT 0.9 0.8 0.7 0.7 0.7  PROT 6.8 6.2* 6.2* 5.8* 5.8*  ALBUMIN 2.7* 2.2* 2.1* 2.1* 2.2*   No results for input(s): LIPASE, AMYLASE in the last 168 hours. Recent Labs  Lab 10/27/20 0511 10/28/20 0420 10/29/20 0731 10/30/20 0527  AMMONIA 40* 46* 33 38*   Coagulation Profile: No results for input(s): INR, PROTIME in  the last 168 hours. Cardiac Enzymes: No results for input(s): CKTOTAL,  CKMB, CKMBINDEX, TROPONINI in the last 168 hours. BNP (last 3 results) No results for input(s): PROBNP in the last 8760 hours. HbA1C: No results for input(s): HGBA1C in the last 72 hours. CBG: Recent Labs  Lab 10/31/20 1156 10/31/20 1635 10/31/20 2043 11/01/20 0343 11/01/20 0821  GLUCAP 184* 137* 123* 111* 110*   Lipid Profile: No results for input(s): CHOL, HDL, LDLCALC, TRIG, CHOLHDL, LDLDIRECT in the last 72 hours. Thyroid Function Tests: Recent Labs    10/29/20 1912  FREET4 1.03   Anemia Panel: No results for input(s): VITAMINB12, FOLATE, FERRITIN, TIBC, IRON, RETICCTPCT in the last 72 hours. Sepsis Labs: No results for input(s): PROCALCITON, LATICACIDVEN in the last 168 hours.  Recent Results (from the past 240 hour(s))  MRSA PCR Screening     Status: None   Collection Time: 10/23/20 10:15 AM   Specimen: Nasopharyngeal  Result Value Ref Range Status   MRSA by PCR NEGATIVE NEGATIVE Final    Comment:        The GeneXpert MRSA Assay (FDA approved for NASAL specimens only), is one component of a comprehensive MRSA colonization surveillance program. It is not intended to diagnose MRSA infection nor to guide or monitor treatment for MRSA infections. Performed at [facility], [address], [place], [place] [address]   Culture, blood (routine x 2)     Status: None   Collection Time: 10/27/20  8:55 AM   Specimen: BLOOD LEFT HAND  Result Value Ref Range Status   Specimen Description   Final    BLOOD LEFT HAND BOTTLES DRAWN AEROBIC AND ANAEROBIC   Special Requests Blood Culture adequate volume  Final   Culture   Final    NO GROWTH 5 DAYS Performed at [facility], [address], [place], [place] [address]    Report Status 11/01/2020 FINAL  Final  Culture, blood (routine x 2)     Status: None   Collection Time: 10/27/20  8:58 AM   Specimen: BLOOD LEFT ARM  Result Value Ref Range Status   Specimen Description BLOOD LEFT ARM BOTTLES DRAWN AEROBIC AND ANAEROBIC   Final   Special Requests Blood Culture adequate volume  Final   Culture   Final    NO GROWTH 5 DAYS Performed at [facility], [address] [place], [place] [address]    Report Status 11/01/2020 FINAL  Final         Radiology Studies: No results found.      Scheduled Meds: . vitamin C  500 mg Oral Daily  . baricitinib  2 mg Oral Daily  . carvedilol  6.25 mg Per Tube BID WC  . Chlorhexidine Gluconate Cloth  6 each Topical Daily  . diltiazem  60 mg Oral Q8H  . feeding supplement (PROSource TF)  45 mL Per Tube BID  . free water  200 mL Per Tube Q4H  . pantoprazole (PROTONIX) IV  40 mg Intravenous Q12H  . sodium zirconium cyclosilicate  10 g Per Tube BID  . zinc sulfate  220 mg Oral Daily   Continuous Infusions: . dextrose 10 mL/hr at 10/31/20 1513     LOS: 11 days    Time spent: 35 minutes    Kathie Dike, MD [facility]  If 7PM-7AM, please contact night-coverage [email or web address] 11/01/2020, 12:10 PM

## 2020-11-01 NOTE — Progress Notes (Signed)
Nutrition Follow-up  DOCUMENTATION CODES:   Obesity unspecified  INTERVENTION:  Ensure Enlive po BID, each supplement provides 350 kcal and 20 grams of protein  Magic cup TID with meals, each supplement provides 290 kcal and 9 grams of protein  ProSource Plus po BID, each supplement provides 100 kcal and 15 grams of protein  D/c ProSource TF   NUTRITION DIAGNOSIS:   Increased nutrient needs related to acute illness (COVID-19) as evidenced by estimated needs. -ongoing  GOAL:   Patient will meet greater than or equal to 90% of their needs -progressing  MONITOR:   PO intake,Labs,Weight trends,Skin,I & O's  REASON FOR ASSESSMENT:   Consult Assessment of nutrition requirement/status  ASSESSMENT:   Darren Burke is a 75 y.o. male with medical history significant for longtime tobacco use, COPD (emphysema), GERD, stage IV CKD, chronic constipation, hypertension, paroxysmal atrial flutter who presented to the emergency department from PCP office with shortness of breath and hypoxia.  He was noted to have a pulse ox of 80 to 81% on room air.  He has been placed on 4 L nasal cannula with improvement of oxygen saturation to 87%.  He does not wear home oxygen.  He has had progressive shortness of breath for the past 2 days.  He feels unwell overall.  He says that he ran out of his albuterol inhaler 4 days ago.  His symptoms have been persistent and progressively getting worse.  His shortness of breath is much worse when ambulating or moving.  He denies chest pain.  He reports a nonproductive cough.  He lives home alone.  He is unvaccinated for COVID-19.  Patient pulled NG overnight, okay to leave out as pt starting to have some oral intake. Tolerating ice chips and fair intake of soft foods per nursing report. Will order Ensure and Magic Cup supplements to help him meet his needs.   Weights up ~14 lbs in the last week, Net 3 L  +2 BLE; +1 generalized noted   Medications reviewed and  include: Vit C, ProSource, Protonix, Lokelma, Zinc sulfate  Labs: CBGs 119,110,111,123, Na 147 (H), BUN 118 (H), Cr 2.19 (H), Hgb 8 (L)   Diet Order:   Diet Order            DIET SOFT Room service appropriate? Yes; Fluid consistency: Thin  Diet effective now                 EDUCATION NEEDS:   No education needs have been identified at this time  Skin:  Skin Assessment: Reviewed RN Assessment  Last BM:  1/6  Height:   Ht Readings from Last 1 Encounters:  10/21/20 5\' 7"  (1.702 m)    Weight:   Wt Readings from Last 1 Encounters:  10/31/20 100.6 kg    Ideal Body Weight:  67.3 kg  BMI:  Body mass index is 34.74 kg/m.  Estimated Nutritional Needs:   Kcal:  7262-0355  Protein:  140-155 grams  Fluid:  > 2 L   Lajuan Lines, RD, LDN Clinical Nutrition After Hours/Weekend Pager # in [place]

## 2020-11-01 NOTE — Evaluation (Signed)
Physical Therapy Evaluation Patient Details Name: Darren Burke MRN: [id number] DOB: 1945/11/15 Today's Date: 11/01/2020   History of Present Illness  Darren Burke is a 75 y.o. male with medical history significant for longtime tobacco use, COPD (emphysema), GERD, stage IV CKD, chronic constipation, hypertension, paroxysmal atrial flutter who presented to the emergency department from PCP office with shortness of breath and hypoxia.  He was noted to have a pulse ox of 80 to 81% on room air.  He has been placed on 4 L nasal cannula with improvement of oxygen saturation to 87%.  He does not wear home oxygen.  He has had progressive shortness of breath for the past 2 days.  He feels unwell overall.  He says that he ran out of his albuterol inhaler 4 days ago.  His symptoms have been persistent and progressively getting worse.  His shortness of breath is much worse when ambulating or moving.  He denies chest pain.  He reports a nonproductive cough.  He lives home alone.  He is unvaccinated for COVID-19.    Clinical Impression  Patient presents very weak demonstrating slow labored movement for sitting up at bedside, frequent leaning forward while seated, at high risk for falls due to poor standing balance and unable to take steps due to generalized weakness.  Patient put back to bed with Mod assist to reposition.  Patient will benefit from continued physical therapy in hospital and recommended venue below to increase strength, balance, endurance for safe ADLs and gait.    Follow Up Recommendations SNF    Equipment Recommendations       Recommendations for Other Services       Precautions / Restrictions Precautions Precautions: Fall Restrictions Weight Bearing Restrictions: No      Mobility  Bed Mobility Overal bed mobility: Needs Assistance Bed Mobility: Sit to Supine;Supine to Sit     Supine to sit: Mod assist;Max assist Sit to supine: Mod assist   General bed mobility comments:  slow labored movement    Transfers Overall transfer level: Needs assistance   Transfers: Sit to/from Stand;Stand Pivot Transfers Sit to Stand: Mod assist;Max assist Stand pivot transfers: Max assist       General transfer comment: Patient limited to stand with RW due to BLE weakness, unable to take steps  Ambulation/Gait                Stairs            Wheelchair Mobility    Modified Rankin (Stroke Patients Only)       Balance Overall balance assessment: Needs assistance Sitting-balance support: Feet supported;No upper extremity supported Sitting balance-Leahy Scale: Poor Sitting balance - Comments: fair/poor seated at EOB, frequent leaning forward due to weakness   Standing balance support: During functional activity;Bilateral upper extremity supported Standing balance-Leahy Scale: Poor Standing balance comment: using RW                             Pertinent Vitals/Pain Pain Assessment: No/denies pain    Home Living Family/patient expects to be discharged to:: Private residence Living Arrangements: Other relatives Available Help at Discharge: Family;Available PRN/intermittently Type of Home: Mobile home Home Access: Stairs to enter   Entrance Stairs-Number of Steps: 3-4 Home Layout: One level Home Equipment: Walker - 2 wheels      Prior Function Level of Independence: Independent with assistive device(s)         Comments: household  ambulator using RW     Hand Dominance        Extremity/Trunk Assessment   Upper Extremity Assessment Upper Extremity Assessment: Generalized weakness    Lower Extremity Assessment Lower Extremity Assessment: Generalized weakness    Cervical / Trunk Assessment Cervical / Trunk Assessment: Normal  Communication   Communication: Other (comment) (Patient had difficulty speaking)  Cognition Arousal/Alertness: Awake/alert Behavior During Therapy: WFL for tasks assessed/performed Overall  Cognitive Status: No family/caregiver present to determine baseline cognitive functioning                                        General Comments      Exercises     Assessment/Plan    PT Assessment Patient needs continued PT services  PT Problem List Decreased strength;Decreased activity tolerance;Decreased balance;Decreased mobility       PT Treatment Interventions Balance training;DME instruction;Gait training;Stair training;Functional mobility training;Therapeutic activities;Therapeutic exercise;Patient/family education    PT Goals (Current goals can be found in the Care Plan section)  Acute Rehab PT Goals Patient Stated Goal: return home after rehab PT Goal Formulation: With patient Time For Goal Achievement: 11/15/20 Potential to Achieve Goals: Good    Frequency Min 3X/week   Barriers to discharge        Co-evaluation               AM-PAC PT "6 Clicks" Mobility  Outcome Measure Help needed turning from your back to your side while in a flat bed without using bedrails?: A Lot Help needed moving from lying on your back to sitting on the side of a flat bed without using bedrails?: A Lot Help needed moving to and from a bed to a chair (including a wheelchair)?: A Lot Help needed standing up from a chair using your arms (e.g., wheelchair or bedside chair)?: A Lot Help needed to walk in hospital room?: Total Help needed climbing 3-5 steps with a railing? : Total 6 Click Score: 10    End of Session   Activity Tolerance: Patient tolerated treatment well;Patient limited by fatigue Patient left: in bed;with call bell/phone within reach Nurse Communication: Mobility status PT Visit Diagnosis: Unsteadiness on feet (R26.81);Other abnormalities of gait and mobility (R26.89);Muscle weakness (generalized) (M62.81)    Time: 2202-5427 PT Time Calculation (min) (ACUTE ONLY): 29 min   Charges:   PT Evaluation $PT Eval Moderate Complexity: 1 Mod PT  Treatments $Therapeutic Activity: 23-37 mins        3:57 PM, 11/01/20 Lonell Grandchild, MPT Physical Therapist with [facility] [phone] office [phone] mobile phone

## 2020-11-01 NOTE — Care Management Important Message (Signed)
Important Message  Patient Details  Name: Darren Burke MRN: [id number] Date of Birth: 06/11/1946   Medicare Important Message Given:  Yes - Important Message mailed due to current National Emergency     [name] 11/01/2020, 3:14 PM

## 2020-11-01 NOTE — Plan of Care (Signed)
  Problem: Acute Rehab PT Goals(only PT should resolve) Goal: Pt Will Go Supine/Side To Sit Outcome: Progressing Flowsheets (Taken 11/01/2020 1558) Pt will go Supine/Side to Sit: with minimal assist Goal: Patient Will Transfer Sit To/From Stand Outcome: Progressing Flowsheets (Taken 11/01/2020 1558) Patient will transfer sit to/from stand:  with minimal assist  with moderate assist Goal: Pt Will Transfer Bed To Chair/Chair To Bed Outcome: Progressing Flowsheets (Taken 11/01/2020 1558) Pt will Transfer Bed to Chair/Chair to Bed: with mod assist Goal: Pt Will Ambulate Outcome: Progressing Flowsheets (Taken 11/01/2020 1558) Pt will Ambulate:  15 feet  with moderate assist  with rolling walker   3:58 PM, 11/01/20 Darren Burke, MPT Physical Therapist with [facility] [phone] office [phone] mobile phone

## 2020-11-01 NOTE — Consult Note (Addendum)
Referring Provider: [facility] Primary Care Physician:  Rosita Fire, MD Primary Gastroenterologist:  Dr. Jenetta Downer (previously unassigned)  Date of Admission: 10/21/20 Date of Consultation: 11/02/19  Reason for Consultation:  Anemia, suspected melena/GI bleed  HPI:  Darren Burke is a 75 y.o. male with a past medical history of COPD, GERD, hypertension, paroxysmal A. flutter, stage IV chronic disease, chronic aspiration.  He was initially admitted from PCP office with shortness of breath hypoxia pulse ox 81% on room air.  Admitted for hypoxic respiratory failure secondary  The patient was admitted for acute hypoxic respiratory failure due to COVID-19 pneumonia, noted to be unvaccinated.  Family was hesitant to start remdesivir but it was started on 10/22/2020.  He had improvement and oxygenation and is intermittently on high flow.  There was question of electrolyte abnormalities causing catatonic state 0.12 but is improved.  NG tube was placed for free water flushes and feedings.  CT with no acute findings.  BiPAP was started couple days ago.  VBG showing hypercapnia.  Today he appears to be moving air versus 1 L oxygen.  He was also started on Cardizem drip for atrial fibrillation with RVR.  Attempts are currently being made to wean off oxygen as tolerated.  Noted to have a positive FOBT with complaints of dark stools.  Noted elevated BUN.  Aspirin and heparin have been held and GI was consulted.  He does have history of GERD, no GI inpatient or outpatient evaluation noted the EMR.  No previous colonoscopy or endoscopy found in our system.  His hemoglobin has declined progressively from 10.9 six days ago to 7.8 yesterday and is stable at 8.0 today.  Today he is difficult to understand with lost voice. Does admit mid to lower abdominal pain. Denies recent hematochezia or melena. Does have GERD. Denies previous EGD or colonoscopy. No N/V. No other overt GI complaints.  Past Medical  History:  Diagnosis Date  . COPD (chronic obstructive pulmonary disease) (HCC)    Emphysema radiographically  . GERD (gastroesophageal reflux disease)   . Hypertension   . Paroxysmal atrial flutter [facility])     Past Surgical History:  Procedure Laterality Date  . ARTERY REPAIR     sinus    Prior to Admission medications   Medication Sig Start Date End Date Taking? Authorizing Provider  albuterol (PROVENTIL HFA;VENTOLIN HFA) 108 (90 Base) MCG/ACT inhaler Inhale 2 puffs into the lungs every 6 (six) hours as needed for wheezing or shortness of breath. 11/27/17  Yes Tat, Shanon Brow, MD  Albuterol Sulfate 2.5 MG/0.5ML NEBU Inhale 2.5 mg into the lungs in the morning and at bedtime.   Yes [provider]  aspirin EC 81 MG EC tablet Take 1 tablet (81 mg total) by mouth daily. 12/12/16  Yes Florencia Reasons, MD  carvedilol (COREG) 3.125 MG tablet Take 1 tablet (3.125 mg total) by mouth 2 (two) times daily with a meal. 12/14/16  Yes Sinda Du, MD  umeclidinium bromide (INCRUSE ELLIPTA) 62.5 MCG/INH AEPB Inhale 1 puff into the lungs daily. 12/14/16  Yes Sinda Du, MD  azithromycin (ZITHROMAX) 500 MG tablet Take 1 tablet (500 mg total) by mouth daily. Patient not taking: No sig reported 11/27/17   Tat, Shanon Brow, MD  lactulose (CHRONULAC) 10 GM/15ML solution Take 30 mLs (20 g total) by mouth 2 (two) times daily as needed for moderate constipation. Patient not taking: No sig reported 02/03/18   Orpah Greek, MD  polyethylene glycol (MIRALAX / GLYCOLAX) packet Take 17 g  by mouth daily. Patient not taking: No sig reported 11/28/17   Tat, Shanon Brow, MD  predniSONE (DELTASONE) 10 MG tablet Take 6 tablets (60 mg total) by mouth daily with breakfast. And decrease by one tablet daily Patient not taking: No sig reported 11/28/17   Tat, Shanon Brow, MD  senna (SENOKOT) 8.6 MG TABS tablet Take 2 tablets (17.2 mg total) by mouth daily. Patient not taking: No sig reported 11/28/17   Orson Eva, MD    Current  Facility-Administered Medications  Medication Dose Route Frequency Provider Last Rate Last Admin  . acetaminophen (TYLENOL) tablet 650 mg  650 mg Oral Q6H PRN Reubin Milan, MD      . albuterol (VENTOLIN HFA) 108 (90 Base) MCG/ACT inhaler 2 puff  2 puff Inhalation Q6H PRN Reubin Milan, MD   2 puff at 10/24/20 2355  . ascorbic acid (VITAMIN C) tablet 500 mg  500 mg Oral Daily Reubin Milan, MD   500 mg at 10/31/20 1035  . baricitinib (OLUMIANT) tablet 2 mg  2 mg Oral Daily Barton Dubois, MD   2 mg at 10/31/20 1035  . bisacodyl (DULCOLAX) suppository 10 mg  10 mg Rectal Daily PRN Barton Dubois, MD   10 mg at 10/26/20 1413  . carvedilol (COREG) tablet 6.25 mg  6.25 mg Per Tube BID WC Kathie Dike, MD   6.25 mg at 10/31/20 1639  . Chlorhexidine Gluconate Cloth 2 % PADS 6 each  6 each Topical Daily Barton Dubois, MD   6 each at 10/31/20 1035  . chlorpheniramine-HYDROcodone (TUSSIONEX) 10-8 MG/5ML suspension 5 mL  5 mL Oral Q12H PRN Reubin Milan, MD      . dextrose 5 % solution   Intravenous Continuous Kathie Dike, MD 10 mL/hr at 10/31/20 1513 Infusion Verify at 10/31/20 1513  . feeding supplement (PROSource TF) liquid 45 mL  45 mL Per Tube BID Manuella Ghazi, Pratik D, DO   45 mL at 10/31/20 1034  . feeding supplement (VITAL AF 1.2 CAL) liquid 1,000 mL  1,000 mL Per Tube Continuous Manuella Ghazi, Pratik D, DO   Stopped at 10/31/20 1415  . free water 200 mL  200 mL Per Tube Q4H Shah, Pratik D, DO   200 mL at 10/31/20 2243  . guaiFENesin-dextromethorphan (ROBITUSSIN DM) 100-10 MG/5ML syrup 10 mL  10 mL Oral Q4H PRN Reubin Milan, MD   10 mL at 10/22/20 2255  . ondansetron (ZOFRAN) tablet 4 mg  4 mg Oral Q6H PRN Reubin Milan, MD       Or  . ondansetron [facility]) injection 4 mg  4 mg Intravenous Q6H PRN Reubin Milan, MD      . pantoprazole (PROTONIX) injection 40 mg  40 mg Intravenous Q12H Kathie Dike, MD   40 mg at 11/01/20 0028  . sodium zirconium cyclosilicate  (LOKELMA) packet 10 g  10 g Per Tube BID Manuella Ghazi, Pratik D, DO   10 g at 10/31/20 1034  . zinc sulfate capsule 220 mg  220 mg Oral Daily Reubin Milan, MD   220 mg at 10/31/20 1035    Allergies as of 10/21/2020 - Review Complete 10/21/2020  Allergen Reaction Noted  . Penicillins Hives and Itching 07/14/2011    Family History  Problem Relation Age of Onset  . Heart failure Mother   . Stroke Father     Social History   Socioeconomic History  . Marital status: Divorced    Spouse name: Not on file  . Number of  children: Not on file  . Years of education: Not on file  . Highest education level: Not on file  Occupational History  . Not on file  Tobacco Use  . Smoking status: Former Smoker    Packs/day: 3.00    Years: 40.00    Pack years: 120.00    Types: Cigarettes  . Smokeless tobacco: Never Used  Vaping Use  . Vaping Use: Never used  Substance and Sexual Activity  . Alcohol use: No  . Drug use: No  . Sexual activity: Not on file  Other Topics Concern  . Not on file  Social History Narrative  . Not on file   Social Determinants of Health   Financial Resource Strain: Not on file  Food Insecurity: Not on file  Transportation Needs: Not on file  Physical Activity: Not on file  Stress: Not on file  Social Connections: Not on file  Intimate Partner Violence: Not on file    Review of Systems: LIMITED DUE TO COMMUNICATION DIFFICULTY General: Negative for weight loss. CV: Negative for chest pain, angina, palpitations, peripheral edema.  Respiratory: Admits dyspnea GI: See history of present illness. MS: Notes back pain.  Endo: Negative for unusual weight change. Heme: Negative for bruising or bleeding.  Physical Exam: Vital signs in last 24 hours: Temp:  [96.9 F (36.1 C)-97.6 F (36.4 C)] 96.9 F (36.1 C) (01/07 0823) Pulse Rate:  [28-92] 29 (01/07 0300) Resp:  [18-31] 22 (01/07 0300) BP: (110-176)/(45-79) 149/66 (01/07 0300) SpO2:  [88 %-100 %] 96 %  (01/07 0300) Last BM Date: 10/31/20 General:   Alert,  Well-developed, well-nourished. Appears somewhat short of breath but no overt distress. Head:  Normocephalic and atraumatic. Eyes:  Sclera clear, no icterus. Conjunctiva pink. Ears:  Normal auditory acuity. Neck:  Supple; no masses or thyromegaly. Lungs:  Diminished throughout. No wheezes, crackles, or rhonchi. No acute distress. Heart:  Irregularly irregular rhythm, appears to be AFib on tele. Rate between 174-081; no murmurs, clicks, rubs,  or gallops. Abdomen:  Soft and nondistended. Mild mid- to lower abdominal TTP. No masses, hepatosplenomegaly or hernias noted. Normal bowel sounds, without guarding, and without rebound. Rectal:  Deferred. Flexiseal noted in place, some fleck of dark stool but system without significant stool collection.   Msk:  Symmetrical without gross deformities. Pulses:  Normal bilateral DP pulses noted. Extremities:  Without clubbing or edema. Neurologic:  Grossly normal neurologically. Skin:  Intact without significant lesions or rashes. Psych:  Alert and cooperative. Normal mood and affect.  Intake/Output from previous day: 01/06 0701 - 01/07 0700 In: 141.3 [I.V.:141.3] Out: 1650 [Urine:1450; Stool:200] Intake/Output this shift: No intake/output data recorded.  Lab Results: Recent Labs    10/30/20 0527 10/31/20 0429 11/01/20 0344  WBC 11.8* 12.6* 10.3  HGB 8.4* 7.8* 8.0*  HCT 29.3* 26.5* 26.0*  PLT 221 235 260   BMET Recent Labs    10/30/20 0527 10/31/20 0429 11/01/20 0344  NA 142 142 147*  K 4.7 4.0 3.9  CL 108 107 111  CO2 24 25 26   GLUCOSE 230* 167* 126*  BUN 112* 126* 118*  CREATININE 2.84* 2.64* 2.19*  CALCIUM 7.7* 7.7* 8.2*   LFT Recent Labs    10/30/20 0527 10/31/20 0429  PROT 5.8* 5.8*  ALBUMIN 2.1* 2.2*  AST 34 38  ALT 81* 89*  ALKPHOS 34* 31*  BILITOT 0.7 0.7   PT/INR No results for input(s): LABPROT, INR in the last 72 hours. Hepatitis Panel No results for  input(s):  HEPBSAG, HCVAB, HEPAIGM, HEPBIGM in the last 72 hours. C-Diff No results for input(s): CDIFFTOX in the last 72 hours.  Studies/Results: No results found.  Impression: 75 year old male presents for hypoxic respiratory failure due to COVID-19 pneumonia status post remdesivir.  Times currently being maintained and off oxygen, generally requiring 1 L.  He is just finished his remdesivir as of a day or 2 ago.  He still in the ICU.  He does have A. fib RVR on Cardizem drip, heart rate much better controlled today. Of note, patient apparently d/c'd his NG tube.  GI bleed: Noted melena yesterday or day before.  FOBT positive yesterday.  Likely chronic anemia due to chronic kidney disease.  Appears to be acute on chronic anemia situation.  Cannot rule out upper GI bleed such as esophagitis, gastritis, duodenitis, peptic ulcer disease.  History of GERD although he is now on PPI as an outpatient. Clinically his hemoglobin is stable today.  He has not had obvious melena today.  Last episode likely yesterday. He has not required blood transfusion.  His anemia is normally microcytic and hypochromic.  Currently it is normocytic and normochromic.  No recent ferritin or iron studies. BUN significantly elevated at 120, possible multifactorial with acute illness and CKD.  Plan: 1. Continue PPI 2. Monitor for recurrent melena/GI bleed 3. Follow hgb 4. Transfuse as necessary 5. Hold on EGD for now, consider early next week if still needed 6. Continued respiratory and cardiac care 7. Supportive care   Thank you for allowing [place] to participate in the care of [place], DNP, AGNP-C Adult & Gerontological Nurse Practitioner [facility]   LOS: 11 days     11/01/2020, 9:00 AM

## 2020-11-02 DIAGNOSIS — Z515 Encounter for palliative care: Secondary | ICD-10-CM | POA: Diagnosis not present

## 2020-11-02 DIAGNOSIS — U071 COVID-19: Secondary | ICD-10-CM | POA: Diagnosis not present

## 2020-11-02 DIAGNOSIS — J9621 Acute and chronic respiratory failure with hypoxia: Secondary | ICD-10-CM | POA: Diagnosis not present

## 2020-11-02 DIAGNOSIS — Z7189 Other specified counseling: Secondary | ICD-10-CM | POA: Diagnosis not present

## 2020-11-02 DIAGNOSIS — J1282 Pneumonia due to coronavirus disease 2019: Secondary | ICD-10-CM | POA: Diagnosis not present

## 2020-11-02 LAB — GLUCOSE, CAPILLARY
Glucose-Capillary: 100 mg/dL — ABNORMAL HIGH (ref 70–99)
Glucose-Capillary: 101 mg/dL — ABNORMAL HIGH (ref 70–99)
Glucose-Capillary: 107 mg/dL — ABNORMAL HIGH (ref 70–99)
Glucose-Capillary: 108 mg/dL — ABNORMAL HIGH (ref 70–99)
Glucose-Capillary: 114 mg/dL — ABNORMAL HIGH (ref 70–99)
Glucose-Capillary: 121 mg/dL — ABNORMAL HIGH (ref 70–99)
Glucose-Capillary: 98 mg/dL (ref 70–99)

## 2020-11-02 LAB — COMPREHENSIVE METABOLIC PANEL
ALT: 125 U/L — ABNORMAL HIGH (ref 0–44)
AST: 40 U/L (ref 15–41)
Albumin: 2.6 g/dL — ABNORMAL LOW (ref 3.5–5.0)
Alkaline Phosphatase: 35 U/L — ABNORMAL LOW (ref 38–126)
Anion gap: 8 (ref 5–15)
BUN: 96 mg/dL — ABNORMAL HIGH (ref 8–23)
CO2: 28 mmol/L (ref 22–32)
Calcium: 8.4 mg/dL — ABNORMAL LOW (ref 8.9–10.3)
Chloride: 109 mmol/L (ref 98–111)
Creatinine, Ser: 2.31 mg/dL — ABNORMAL HIGH (ref 0.61–1.24)
GFR, Estimated: 29 mL/min — ABNORMAL LOW (ref >60)
Glucose, Bld: 126 mg/dL — ABNORMAL HIGH (ref 70–99)
Potassium: 4.2 mmol/L (ref 3.5–5.1)
Sodium: 145 mmol/L (ref 135–145)
Total Bilirubin: 0.8 mg/dL (ref 0.3–1.2)
Total Protein: 6.2 g/dL — ABNORMAL LOW (ref 6.5–8.1)

## 2020-11-02 LAB — IRON AND TIBC
Iron: 268 ug/dL — ABNORMAL HIGH (ref 45–182)
Saturation Ratios: 86 % — ABNORMAL HIGH (ref 17.9–39.5)
TIBC: 313 ug/dL (ref 250–450)
UIBC: 45 ug/dL

## 2020-11-02 LAB — CBC
HCT: 27.8 % — ABNORMAL LOW (ref 39.0–52.0)
Hemoglobin: 8.6 g/dL — ABNORMAL LOW (ref 13.0–17.0)
MCH: 31.2 pg (ref 26.0–34.0)
MCHC: 30.9 g/dL (ref 30.0–36.0)
MCV: 100.7 fL — ABNORMAL HIGH (ref 80.0–100.0)
Platelets: 328 10*3/uL (ref 150–400)
RBC: 2.76 MIL/uL — ABNORMAL LOW (ref 4.22–5.81)
RDW: 15.2 % (ref 11.5–15.5)
WBC: 9.6 10*3/uL (ref 4.0–10.5)
nRBC: 0.7 % — ABNORMAL HIGH (ref 0.0–0.2)

## 2020-11-02 LAB — FERRITIN: Ferritin: 419 ng/mL — ABNORMAL HIGH (ref 24–336)

## 2020-11-02 MED ORDER — CARVEDILOL 3.125 MG PO TABS
6.2500 mg | ORAL_TABLET | Freq: Two times a day (BID) | ORAL | Status: DC
  Administered 2020-11-02 – 2020-11-11 (×14): 6.25 mg
  Filled 2020-11-02 (×20): qty 2

## 2020-11-02 MED ORDER — DILTIAZEM HCL 60 MG PO TABS
60.0000 mg | ORAL_TABLET | Freq: Three times a day (TID) | ORAL | Status: DC
  Administered 2020-11-02: 60 mg via ORAL
  Filled 2020-11-02: qty 1

## 2020-11-02 MED ORDER — DILTIAZEM HCL 60 MG PO TABS
60.0000 mg | ORAL_TABLET | Freq: Four times a day (QID) | ORAL | Status: DC
  Administered 2020-11-03 (×3): 60 mg via ORAL
  Filled 2020-11-02 (×3): qty 1

## 2020-11-02 MED ORDER — CARVEDILOL 12.5 MG PO TABS: 12.5000 mg | ORAL_TABLET | Freq: Two times a day (BID) | ORAL | Status: DC

## 2020-11-02 MED ORDER — DILTIAZEM HCL 60 MG PO TABS: 60.0000 mg | ORAL_TABLET | Freq: Four times a day (QID) | ORAL | Status: DC

## 2020-11-02 MED ORDER — DILTIAZEM HCL 25 MG/5ML IV SOLN
10.0000 mg | Freq: Once | INTRAVENOUS | Status: AC
  Administered 2020-11-02: 10 mg via INTRAVENOUS
  Filled 2020-11-02: qty 5

## 2020-11-02 MED ORDER — FUROSEMIDE 10 MG/ML IJ SOLN
40.0000 mg | Freq: Once | INTRAMUSCULAR | Status: AC
  Administered 2020-11-02: 40 mg via INTRAVENOUS
  Filled 2020-11-02: qty 4

## 2020-11-02 NOTE — Progress Notes (Signed)
Darren Burke, M.D. Gastroenterology & Hepatology   Interval History:  No acute events overnight.  Patient is currently of supplementary oxygen.  Denies having any complaints, per nursing staff the patient has not move his bowels since yesterday.  No episodes of nausea, vomiting or hematemesis.  Has been tolerating diet adequately. Most recent hemoglobin today was 8.6 with MCV 100.  Notably his iron stores were within normal limits with iron 268, ferritin slightly elevated 419 and saturation of 86%.  Inpatient Medications:  Current Facility-Administered Medications:  .  (feeding supplement) PROSource Plus liquid 30 mL, 30 mL, Oral, BID BM, Kathie Dike, MD, 30 mL at 11/02/20 0916 .  acetaminophen (TYLENOL) tablet 650 mg, 650 mg, Oral, Q6H PRN, Reubin Milan, MD .  albuterol (VENTOLIN HFA) 108 (90 Base) MCG/ACT inhaler 2 puff, 2 puff, Inhalation, Q6H PRN, Reubin Milan, MD, 2 puff at 10/24/20 2355 .  ascorbic acid (VITAMIN C) tablet 500 mg, 500 mg, Oral, Daily, Reubin Milan, MD, 500 mg at 11/02/20 6789 .  baricitinib (OLUMIANT) tablet 2 mg, 2 mg, Oral, Daily, Barton Dubois, MD, 2 mg at 11/02/20 0916 .  bisacodyl (DULCOLAX) suppository 10 mg, 10 mg, Rectal, Daily PRN, Barton Dubois, MD, 10 mg at 10/26/20 1413 .  carvedilol (COREG) tablet 6.25 mg, 6.25 mg, Per Tube, BID WC, Kathie Dike, MD, 6.25 mg at 11/02/20 0916 .  Chlorhexidine Gluconate Cloth 2 % PADS 6 each, 6 each, Topical, Daily, Barton Dubois, MD, 6 each at 11/02/20 [phone] .  chlorpheniramine-HYDROcodone (TUSSIONEX) 10-8 MG/5ML suspension 5 mL, 5 mL, Oral, Q12H PRN, Reubin Milan, MD .  dextrose 5 % solution, , Intravenous, Continuous, Kathie Dike, MD, Stopped at 11/02/20 0444 .  diltiazem (CARDIZEM) tablet 60 mg, 60 mg, Oral, Q8H, [name], Jehanzeb, MD, 60 mg at 11/02/20 0636 .  feeding supplement (ENSURE ENLIVE / ENSURE PLUS) liquid 237 mL, 237 mL, Oral, TID BM, [name], Jehanzeb, MD, 237 mL at  11/02/20 0919 .  free water 200 mL, 200 mL, Per Tube, Q4H, Shah, Pratik D, DO, 200 mL at 11/01/20 1650 .  guaiFENesin-dextromethorphan (ROBITUSSIN DM) 100-10 MG/5ML syrup 10 mL, 10 mL, Oral, Q4H PRN, Reubin Milan, MD, 10 mL at 10/22/20 2255 .  ondansetron (ZOFRAN) tablet 4 mg, 4 mg, Oral, Q6H PRN **OR** ondansetron (ZOFRAN) injection 4 mg, 4 mg, Intravenous, Q6H PRN, Reubin Milan, MD .  pantoprazole (PROTONIX) injection 40 mg, 40 mg, Intravenous, Q12H, Kathie Dike, MD, 40 mg at 11/02/20 0916 .  sodium zirconium cyclosilicate (LOKELMA) packet 10 g, 10 g, Per Tube, BID, Manuella Ghazi, Pratik D, DO, 10 g at 11/02/20 1751 .  zinc sulfate capsule 220 mg, 220 mg, Oral, Daily, Reubin Milan, MD, 220 mg at 11/02/20 0258   I/O    Intake/Output Summary (Last 24 hours) at 11/02/2020 1043 Last data filed at 11/02/2020 0900 Gross per 24 hour  Intake 429.24 ml  Output --  Net 429.24 ml     Physical Exam: Temp:  [96.5 F (35.8 C)-97.8 F (36.6 C)] 97.6 F (36.4 C) (01/08 0815) Pulse Rate:  [35-109] 109 (01/08 0916) Resp:  [18-32] 22 (01/08 0900) BP: (128-172)/(50-95) 172/78 (01/08 0916) SpO2:  [91 %-98 %] 98 % (01/08 0900) Weight:  [97.6 kg] 97.6 kg (01/08 0408)  Temp (24hrs), Avg:97.4 F (36.3 C), Min:96.5 F (35.8 C), Max:97.8 F (36.6 C) GENERAL: The patient is AOX3, in no acute distress.  Does not converse easily as he has problems with his voice. HEENT: Head is  normocephalic and atraumatic. EOMI are intact. Mouth is well hydrated and without lesions. NECK: Supple. No masses LUNGS: Clear to auscultation. No presence of rhonchi/wheezing/rales. Adequate chest expansion HEART: RRR, normal s1 and s2. ABDOMEN: Soft, nontender, no guarding, no peritoneal signs, and nondistended. BS +. No masses. EXTREMITIES: Without any cyanosis, clubbing, rash, lesions or edema. NEUROLOGIC: AOx3, no focal motor deficit. SKIN: no jaundice, no rashes  Laboratory Data: CBC:     Component Value  Date/Time   WBC 9.6 11/02/2020 0445   RBC 2.76 (L) 11/02/2020 0445   HGB 8.6 (L) 11/02/2020 0445   HCT 27.8 (L) 11/02/2020 0445   PLT 328 11/02/2020 0445   MCV 100.7 (H) 11/02/2020 0445   MCH 31.2 11/02/2020 0445   MCHC 30.9 11/02/2020 0445   RDW 15.2 11/02/2020 0445   LYMPHSABS 0.6 (L) 10/26/2020 0537   MONOABS 0.9 10/26/2020 0537   EOSABS 0.0 10/26/2020 0537   BASOSABS 0.0 10/26/2020 0537   COAG: No results found for: INR, PROTIME  BMP:  BMP Latest Ref Rng & Units 11/01/2020 10/31/2020 10/30/2020  Glucose 70 - 99 mg/dL 126(H) 167(H) 230(H)  BUN 8 - 23 mg/dL 118(H) 126(H) 112(H)  Creatinine 0.61 - 1.24 mg/dL 2.19(H) 2.64(H) 2.84(H)  Sodium 135 - 145 mmol/L 147(H) 142 142  Potassium 3.5 - 5.1 mmol/L 3.9 4.0 4.7  Chloride 98 - 111 mmol/L 111 107 108  CO2 22 - 32 mmol/L _0 Calcium 8.9 - 10.3 mg/dL 8.2(L) 7.7(L) 7.7(L)    HEPATIC:  Hepatic Function Latest Ref Rng & Units 10/31/2020 10/30/2020 10/29/2020  Total Protein 6.5 - 8.1 g/dL 5.8(L) 5.8(L) 6.2(L)  Albumin 3.5 - 5.0 g/dL 2.2(L) 2.1(L) 2.1(L)  AST 15 - 41 U/L 38 34 48(H)  ALT 0 - 44 U/L 89(H) 81(H) 87(H)  Alk Phosphatase 38 - 126 U/L 31(L) 34(L) 38  Total Bilirubin 0.3 - 1.2 mg/dL 0.7 0.7 0.7  Bilirubin, Direct 0.0 - 0.2 mg/dL - - -    CARDIAC:  Lab Results  Component Value Date   CKTOTAL 1,194 (H) 12/13/2016   CKMB 2.4 08/22/2009   TROPONINI <0.03 11/24/2017      Imaging: I personally reviewed and interpreted the available labs, imaging and endoscopic files.   Assessment/Plan: Briefly, this is a 75 year old male with past medical history of GERD, hypertension, COPD, paroxysmal A. fib, stage IV CKD, who was admitted on 10/21/2020 after presenting worsening shortness of breath and was found to have acute respiratory failure due to Covid pneumonia. Gastroenterology was consulted given progressive decline in his hemoglobin.    The patient had some dark stools since admission but none of them have been melena or he has not  been present any hematochezia.  He has not been on any anticoagulant or NSAID.  Notably, his hemoglobin has remained stable for the last couple of days and he has not required any blood transfusion, has remained hemodynamically stable.  In fact, his iron stores are within normal limits at this moment which make an active gastrointestinal bleeding less likely.  Due to his current severe viral infection, will hold on performing any emergent endoscopic procedures unless he presented overt clinical bleeding or further drop in his hemoglobin with requirement of transfusion.  He will need to continue taking PPI twice daily.  Consideration for an outpatient EGD and colonoscopy will be discussed with the patient.  # Anemia - Repeat CBC qday, transfuse if Hb <7 - Pantoprazole 40 mg q12h IVP - 2 large bore IV lines -  Active T/S - Advance diet as tolerated - Avoid NSAIDs - Will proceed with EGD if she presents any overt signs of gastrointestinal bleeding or has further decline in his hemoglobin with recurrent transfusion - Will require outpatient colonoscopy   Darren Peppers, MD Gastroenterology and Hepatology [facility]

## 2020-11-02 NOTE — Progress Notes (Addendum)
PROGRESS NOTE    Darren Burke  UYQ:[id number] DOB: 1946-08-18 DOA: 10/21/2020 PCP: Rosita Fire, MD   Brief Narrative:  Darren Burke a 75 y.o.malewith medical history significantfor longtimetobacco use, COPD(emphysema),GERD,stage IV CKD, chronic constipation, hypertension, paroxysmal atrial flutter who presented to the emergency department from PCP office with shortness of breath and hypoxia. He was noted to have a pulse ox of 80 to 81% on room air. Patient was admitted with acute hypoxemic respiratory failure secondary to Covid pneumonia and is unvaccinated. He and his family were unsure of starting remdesivir, but would now like to start this medication and therefore it will be initiated 12/28. His hypoxemiais currently improved to 1 L nasal cannula oxygen. He is having worsening electrolyte abnormalities and appears to be catatonic on 1/2, but is improving.  He was noted to have low blood pressure readings overnight on 1/3 and continues to have significant hyponatremia and hypokalemia.  NG tube to be placed for free water flushes and feedings.  CT head with no acute findings.  ABG with hypercapnia noted with BiPAP started.  Also noted to be in atrial fibrillation with RVR and started on Cardizem drip.   Assessment & Plan:   Principal Problem:   Pneumonia due to COVID-19 virus Active Problems:   Pulmonary emphysema ([place])   COPD with acute exacerbation (HCC)   SOB (shortness of breath)   Hypoxia   Acute and chronic respiratory failure with hypoxia (HCC)   HTN (hypertension)   Paroxysmal atrial flutter (HCC)   Anemia   CKD (chronic kidney disease), stage IV (HCC)   Transaminitis   Melena   Gastrointestinal hemorrhage   1. Acute respiratory failure with hypoxia secondary to Covid pneumonia-patient is unvaccinated. He has underlying chronic lung disease and other co-morbidities. He remains HighRisk for adverse outcomeandseverelungparenchymal injury. He and  his family initially refused the use of remdesivir;thenon 10/22/2020 accepted to be treated along with the use of steroids. Patient with good response to the initiation of barcitiniband currently down to just1L nasal cannula supplementation. Will continue current management, supportive care and continue to wean off supplementation as tolerated. Has completed remdesivir infusion. 2. Acute encephalopathy-multifactorial.  Family reports that he may have some baseline cognitive deficits and early signs of dementia at baseline.  CT head found to be negative.  Suspect that worsening mental status is related to acute infectious process/metabolic issues.  Limit sedating medications.  Overall mental status appears to be improving, he is more alert and interactive, although not back to baseline yet 3. 1 set blood culture positive for micrococcus likely contaminant.  Repeat blood cultures negative.  Rocephin was discontinued 4. Low TSH.  Free T4 is normal.  Repeat labs in 3 to 4 weeks as an outpatient 5. Chronic stage IV kidney disease -his creatinine is stable overall and will continue following trend. Continue minimizing nephrotoxic agents and avoiding hypotension.  Patient has markedly elevated BUN.  His FOBT is also positive.  Concern for GI bleeding. 6. GI bleeding.  Noted to have FOBT positive and dark stools.  BUN is markedly elevated.  Aspirin and heparin have been discontinued.GI has seen the patient and will plan on endoscopic evaluation if he has gross bleeding or a further drop in hemoglobin necessitating transfusion. He is on PPI 7. COPD with acute exacerbation secondary to Covid infection treated with IV steroids and bronchodilators as mentioned above.  Overall respiratory status has stabilized and steroids have been discontinued 8. Anemia in CKD-follow CBC closely; patient was noted  to have dark stools which were heme positive.  Hemoglobin did trend down from 12.2 on admission, down to a nadir of   7.8. Since that time, hemoglobin has been stable. Continue to follow hemoglobin and transfuse as needed 9. Paroxysmal atrial flutter-currently with RVR.  He is on carvedilol and diltiazem for rate control. Since he is having bursts of tachycardia, will adjust diltiazem dosing.  Chadsvasc score of 2 for hypertension and age.  Not a candidate for anticoagulation at this time due to concerns for GI bleeding. 10. Chronic constipation - laxative therapy ordered. Follow response and further improve bowel management as required. 11. Transaminitis - likely reactive from current Covid infection. Currently stable.  Continue to monitor. 12. Hypernatremia/hyperkalemia: Improved after receiving hypotonic fluids and Lokelma.  Continue to monitor  DVT prophylaxis: SCDs Code Status:Partial code, no CPR Family Communication:Discussed with daughterat the bedside 1/8 Disposition Plan: Status is: Inpatient  Remains inpatient appropriate because:IV treatments appropriate due to intensity of illness or inability to take PO and Inpatient level of care appropriate due to severity of illness   Dispo: The patient is from: Home Anticipated d/c is to: TBD Anticipated d/c date is: 3 daysor so. Patient currently is not medically stable to d/c.Patient will be continued on steroids and Barcitinib. Has completed Remdesivir infusion. Will provide gentle fluid resuscitation to assist with hypernatremia anddiscontinue use of Seroquel.  Now with NG tube feedings and free water flushes.  Continue to monitor electrolytes.    Consultants:  Palliative care  Gastroenterology  Procedures:  NGT 1/3>1/6  See below; CT head 1/4 with no significant findings  Antimicrobials:  Anti-infectives (From admission, onward)   Start     Dose/Rate Route Frequency Ordered Stop   10/26/20 1730  cefTRIAXone (ROCEPHIN) 2 g in sodium chloride 0.9 % 100 mL IVPB  Status:  Discontinued         2 g 200 mL/hr over 30 Minutes Intravenous Every 24 hours 10/26/20 1636 10/29/20 0942   10/23/20 1000  remdesivir 100 mg in sodium chloride 0.9 % 100 mL IVPB       "Followed by" Linked Group Details   100 mg 200 mL/hr over 30 Minutes Intravenous Daily 10/22/20 1154 10/26/20 1128   10/22/20 1200  remdesivir 100 mg in sodium chloride 0.9 % 100 mL IVPB       "Followed by" Linked Group Details   100 mg 200 mL/hr over 30 Minutes Intravenous Every 30 min 10/22/20 1154 10/22/20 1436   10/22/20 1000  remdesivir 100 mg in sodium chloride 0.9 % 100 mL IVPB  Status:  Discontinued        100 mg 200 mL/hr over 30 Minutes Intravenous Daily 10/21/20 1353 10/21/20 1418   10/21/20 1430  remdesivir 100 mg in sodium chloride 0.9 % 100 mL IVPB  Status:  Discontinued        100 mg 200 mL/hr over 30 Minutes Intravenous Every 1 hr x 2 10/21/20 1353 10/21/20 1418      Subjective: He appears to be doing better today.  P.o. intake is increasing.  He has not had any shortness of breath.  He has had episodes of tachycardia on monitoring, although it appears that much of it is artifact  Objective: Vitals:   11/02/20 1631 11/02/20 1700 11/02/20 1800 11/02/20 1844  BP: (!) 152/91 (!) 139/98 131/78   Pulse: 95 70 83 71  Resp:  16 (!) 23 19  Temp:  97.7 F (36.5 C)    TempSrc:  Oral  SpO2:  93% 93% 91%  Weight:      Height:        Intake/Output Summary (Last 24 hours) at 11/02/2020 1928 Last data filed at 11/02/2020 1754 Gross per 24 hour  Intake 729.24 ml  Output 1000 ml  Net -270.76 ml   Filed Weights   10/30/20 0501 10/31/20 0504 11/02/20 0408  Weight: 99.2 kg 100.6 kg 97.6 kg    Examination:  General exam: Alert, awake, no distress Respiratory system: Clear to auscultation. Respiratory effort normal. Cardiovascular system:irregular. No murmurs, rubs, gallops. Gastrointestinal system: Abdomen is nondistended, soft and nontender. No organomegaly or masses felt. Normal bowel sounds  heard. Central nervous system: Alert and oriented. No focal neurological deficits. Extremities: No C/C/E, +pedal pulses Skin: No rashes, lesions or ulcers Psychiatry: Judgement and insight appear normal. Mood & affect appropriate.     Data Reviewed: I have personally reviewed following labs and imaging studies  CBC: Recent Labs  Lab 10/29/20 0731 10/30/20 0527 10/31/20 0429 11/01/20 0344 11/02/20 0445  WBC 11.8* 11.8* 12.6* 10.3 9.6  HGB 8.8* 8.4* 7.8* 8.0* 8.6*  HCT 30.3* 29.3* 26.5* 26.0* 27.8*  MCV 104.5* 103.5* 98.9 97.7 100.7*  PLT 217 221 235 260 431   Basic Metabolic Panel: Recent Labs  Lab 10/28/20 0420 10/28/20 1154 10/28/20 2033 10/29/20 0456 10/29/20 0731 10/29/20 1912 10/30/20 0527 10/31/20 0429 11/01/20 0344 11/02/20 0640  NA 157*  --   --   --  148*  --  142 142 147* 145  K 5.6*  --   --   --  5.4*  --  4.7 4.0 3.9 4.2  CL 121*  --   --   --  114*  --  108 107 111 109  CO2 27  --   --   --  24  --  24 25 26 28   GLUCOSE 163*  --   --   --  244*  --  230* 167* 126* 126*  BUN 114*  --   --   --  119*  --  112* 126* 118* 96*  CREATININE 2.87*  --   --   --  2.93*  --  2.84* 2.64* 2.19* 2.31*  CALCIUM 8.2*  --   --   --  8.2*  --  7.7* 7.7* 8.2* 8.4*  MG 2.9*  --   --   --   --   --  2.5*  --   --   --   PHOS  --  3.6 4.0 3.7  --  3.9  --   --   --   --    GFR: Estimated Creatinine Clearance: 32.2 mL/min (A) (by C-G formula based on SCr of 2.31 mg/dL (H)). Liver Function Tests: Recent Labs  Lab 10/28/20 0420 10/29/20 0731 10/30/20 0527 10/31/20 0429 11/02/20 0640  AST 35 48* 34 38 40  ALT 65* 87* 81* 89* 125*  ALKPHOS 35* 38 34* 31* 35*  BILITOT 0.8 0.7 0.7 0.7 0.8  PROT 6.2* 6.2* 5.8* 5.8* 6.2*  ALBUMIN 2.2* 2.1* 2.1* 2.2* 2.6*   No results for input(s): LIPASE, AMYLASE in the last 168 hours. Recent Labs  Lab 10/27/20 0511 10/28/20 0420 10/29/20 0731 10/30/20 0527  AMMONIA 40* 46* 33 38*   Coagulation Profile: No results for  input(s): INR, PROTIME in the last 168 hours. Cardiac Enzymes: No results for input(s): CKTOTAL, CKMB, CKMBINDEX, TROPONINI in the last 168 hours. BNP (last 3 results) No results for input(s): PROBNP  in the last 8760 hours. HbA1C: No results for input(s): HGBA1C in the last 72 hours. CBG: Recent Labs  Lab 11/02/20 0045 11/02/20 0526 11/02/20 0814 11/02/20 1135 11/02/20 1721  GLUCAP 108* 98 101* 121* 100*   Lipid Profile: No results for input(s): CHOL, HDL, LDLCALC, TRIG, CHOLHDL, LDLDIRECT in the last 72 hours. Thyroid Function Tests: No results for input(s): TSH, T4TOTAL, FREET4, T3FREE, THYROIDAB in the last 72 hours. Anemia Panel: Recent Labs    11/02/20 0445  FERRITIN 419*  TIBC 313  IRON 268*   Sepsis Labs: No results for input(s): PROCALCITON, LATICACIDVEN in the last 168 hours.  Recent Results (from the past 240 hour(s))  Culture, blood (routine x 2)     Status: None   Collection Time: 10/27/20  8:55 AM   Specimen: BLOOD LEFT HAND  Result Value Ref Range Status   Specimen Description   Final    BLOOD LEFT HAND BOTTLES DRAWN AEROBIC AND ANAEROBIC   Special Requests Blood Culture adequate volume  Final   Culture   Final    NO GROWTH 5 DAYS Performed at [facility], [address] [place], [place] [address]    Report Status 11/01/2020 FINAL  Final  Culture, blood (routine x 2)     Status: None   Collection Time: 10/27/20  8:58 AM   Specimen: BLOOD LEFT ARM  Result Value Ref Range Status   Specimen Description BLOOD LEFT ARM BOTTLES DRAWN AEROBIC AND ANAEROBIC  Final   Special Requests Blood Culture adequate volume  Final   Culture   Final    NO GROWTH 5 DAYS Performed at [facility], [address] [place], [place] [address]    Report Status 11/01/2020 FINAL  Final         Radiology Studies: No results found.      Scheduled Meds: . (feeding supplement) PROSource Plus  30 mL Oral BID BM  . vitamin C  500 mg Oral Daily  . baricitinib  2 mg  Oral Daily  . carvedilol  6.25 mg Per Tube BID WC  . Chlorhexidine Gluconate Cloth  6 each Topical Daily  . [START ON 11/03/2020] diltiazem  60 mg Oral Q6H  . feeding supplement  237 mL Oral TID BM  . pantoprazole (PROTONIX) IV  40 mg Intravenous Q12H  . sodium zirconium cyclosilicate  10 g Per Tube BID  . zinc sulfate  220 mg Oral Daily   Continuous Infusions: . dextrose Stopped (11/02/20 0444)     LOS: 12 days    Time spent: 35 minutes    Kathie Dike, MD [facility]  If 7PM-7AM, please contact night-coverage [email or web address] 11/02/2020, 7:28 PM

## 2020-11-03 DIAGNOSIS — U071 COVID-19: Secondary | ICD-10-CM | POA: Diagnosis not present

## 2020-11-03 DIAGNOSIS — J9621 Acute and chronic respiratory failure with hypoxia: Secondary | ICD-10-CM | POA: Diagnosis not present

## 2020-11-03 DIAGNOSIS — Z7189 Other specified counseling: Secondary | ICD-10-CM | POA: Diagnosis not present

## 2020-11-03 DIAGNOSIS — Z515 Encounter for palliative care: Secondary | ICD-10-CM | POA: Diagnosis not present

## 2020-11-03 LAB — GLUCOSE, CAPILLARY
Glucose-Capillary: 110 mg/dL — ABNORMAL HIGH (ref 70–99)
Glucose-Capillary: 110 mg/dL — ABNORMAL HIGH (ref 70–99)
Glucose-Capillary: 128 mg/dL — ABNORMAL HIGH (ref 70–99)
Glucose-Capillary: 101 mg/dL — ABNORMAL HIGH (ref 70–99)
Glucose-Capillary: 128 mg/dL — ABNORMAL HIGH (ref 70–99)

## 2020-11-03 LAB — BASIC METABOLIC PANEL
Anion gap: 8 (ref 5–15)
BUN: 118 mg/dL — ABNORMAL HIGH (ref 8–23)
CO2: 29 mmol/L (ref 22–32)
Calcium: 8.6 mg/dL — ABNORMAL LOW (ref 8.9–10.3)
Chloride: 111 mmol/L (ref 98–111)
Creatinine, Ser: 2.52 mg/dL — ABNORMAL HIGH (ref 0.61–1.24)
GFR, Estimated: 26 mL/min — ABNORMAL LOW (ref >60)
Glucose, Bld: 125 mg/dL — ABNORMAL HIGH (ref 70–99)
Potassium: 4.6 mmol/L (ref 3.5–5.1)
Sodium: 148 mmol/L — ABNORMAL HIGH (ref 135–145)

## 2020-11-03 LAB — CBC
HCT: 29.7 % — ABNORMAL LOW (ref 39.0–52.0)
Hemoglobin: 8.7 g/dL — ABNORMAL LOW (ref 13.0–17.0)
MCH: 29.9 pg (ref 26.0–34.0)
MCHC: 29.3 g/dL — ABNORMAL LOW (ref 30.0–36.0)
MCV: 102.1 fL — ABNORMAL HIGH (ref 80.0–100.0)
Platelets: 324 10*3/uL (ref 150–400)
RBC: 2.91 MIL/uL — ABNORMAL LOW (ref 4.22–5.81)
RDW: 14.3 % (ref 11.5–15.5)
WBC: 10.1 10*3/uL (ref 4.0–10.5)
nRBC: 1.8 % — ABNORMAL HIGH (ref 0.0–0.2)

## 2020-11-03 MED ORDER — SODIUM CHLORIDE 0.9 % IV BOLUS
500.0000 mL | Freq: Once | INTRAVENOUS | Status: AC
  Administered 2020-11-03: 500 mL via INTRAVENOUS

## 2020-11-03 MED ORDER — DILTIAZEM HCL 60 MG PO TABS
60.0000 mg | ORAL_TABLET | Freq: Three times a day (TID) | ORAL | Status: DC
  Administered 2020-11-04 – 2020-11-11 (×14): 60 mg via ORAL
  Filled 2020-11-03 (×20): qty 1

## 2020-11-03 MED ORDER — SODIUM CHLORIDE 0.45 % IV SOLN: INTRAVENOUS | Status: DC

## 2020-11-03 NOTE — Progress Notes (Signed)
PROGRESS NOTE    Darren Burke  WUJ:[id number] DOB: 1946/02/27 DOA: 10/21/2020 PCP: Rosita Fire, MD   Brief Narrative:  Darren Burke a 75 y.o.malewith medical history significantfor longtimetobacco use, COPD(emphysema),GERD,stage IV CKD, chronic constipation, hypertension, paroxysmal atrial flutter who presented to the emergency department from PCP office with shortness of breath and hypoxia. He was noted to have a pulse ox of 80 to 81% on room air. Patient was admitted with acute hypoxemic respiratory failure secondary to Covid pneumonia and is unvaccinated. He and his family were unsure of starting remdesivir, but would now like to start this medication and therefore it will be initiated 12/28. His hypoxemiais currently improved to 1 L nasal cannula oxygen. He is having worsening electrolyte abnormalities and appears to be catatonic on 1/2, but is improving.  He was noted to have low blood pressure readings overnight on 1/3 and continues to have significant hyponatremia and hypokalemia.  NG tube to be placed for free water flushes and feedings.  CT head with no acute findings.  ABG with hypercapnia noted with BiPAP started.  Also noted to be in atrial fibrillation with RVR and started on Cardizem drip.   Assessment & Plan:   Principal Problem:   Pneumonia due to COVID-19 virus Active Problems:   Pulmonary emphysema ([place])   COPD with acute exacerbation (HCC)   SOB (shortness of breath)   Hypoxia   Acute and chronic respiratory failure with hypoxia (HCC)   HTN (hypertension)   Paroxysmal atrial flutter (HCC)   Anemia   CKD (chronic kidney disease), stage IV (HCC)   Transaminitis   Melena   Gastrointestinal hemorrhage   1. Acute respiratory failure with hypoxia secondary to Covid pneumonia-patient is unvaccinated. He has underlying chronic lung disease and other co-morbidities. He remains HighRisk for adverse outcomeandseverelungparenchymal injury. He and  his family initially refused the use of remdesivir;thenon 10/22/2020 accepted to be treated along with the use of steroids. Patient with good response to the initiation of barcitiniband currently has been weaned off oxygen. Will continue current management, supportive care. Has completed remdesivir infusion. 2. Acute encephalopathy-multifactorial.  Family reports that he may have some baseline cognitive deficits and early signs of dementia at baseline.  CT head found to be negative.  Suspect that worsening mental status was related to acute infectious process/metabolic issues.  Limit sedating medications.  Overall mental status appears to be improving 3. 1 set blood culture positive for micrococcus likely contaminant.  Repeat blood cultures negative.  Rocephin was discontinued 4. Low TSH.  Free T4 is normal.  Repeat labs in 3 to 4 weeks as an outpatient 5. Chronic stage IV kidney disease -his creatinine is stable overall and will continue following trend. Continue minimizing nephrotoxic agents and avoiding hypotension.  Patient has markedly elevated BUN.  His FOBT is also positive.  Concern for GI bleeding. 6. GI bleeding.  Noted to have FOBT positive and dark stools.  BUN is markedly elevated.  Aspirin and heparin have been discontinued.GI has seen the patient and will plan on endoscopic evaluation if he has gross bleeding or a further drop in hemoglobin necessitating transfusion. He is on PPI 7. COPD with acute exacerbation secondary to Covid infection treated with IV steroids and bronchodilators as mentioned above.  Overall respiratory status has stabilized and steroids have been discontinued 8. Anemia in CKD-follow CBC closely; patient was noted to have dark stools which were heme positive.  Hemoglobin did trend down from 12.2 on admission, down to a nadir  of  7.8. Since that time, hemoglobin has been stable. Continue to follow hemoglobin and transfuse as needed 9. Paroxysmal atrial  flutter-currently with RVR.  He is on carvedilol and diltiazem for rate control. Since he is having bursts of tachycardia, will adjust diltiazem dosing.  Chadsvasc score of 2 for hypertension and age.  Not a candidate for anticoagulation at this time due to concerns for GI bleeding. 10. Chronic constipation - laxative therapy ordered. Follow response and further improve bowel management as required. 11. Transaminitis - likely reactive from current Covid infection. Currently stable.  Continue to monitor. 12. Hypernatremia/hyperkalemia: Improved after receiving hypotonic fluids and Lokelma.  Continue to monitor 13. Goals of care.  Detailed discussion with the patient's daughter regarding his expected prognosis.  Recommended DNR status and she is in agreement.  She wishes to continue with current treatments, but if he were to develop cardiac/respiratory arrest, she would not want heroic measures  DVT prophylaxis: SCDs Code Status:DNR Family Communication:Discussed with daughterat the bedside 1/9 Disposition Plan: Status is: Inpatient  Remains inpatient appropriate because:IV treatments appropriate due to intensity of illness or inability to take PO and Inpatient level of care appropriate due to severity of illness   Dispo: The patient is from: Home Anticipated d/c is to: TBD Anticipated d/c date is: 3 daysor so. Patient currently is not medically stable to d/c.Patient will be continued on steroids and Barcitinib. Has completed Remdesivir infusion. Will provide gentle fluid resuscitation to assist with hypernatremia anddiscontinue use of Seroquel.  Now with NG tube feedings and free water flushes.  Continue to monitor electrolytes.    Consultants:  Palliative care  Gastroenterology  Procedures:  NGT 1/3>1/6  See below; CT head 1/4 with no significant findings  Antimicrobials:  Anti-infectives (From admission, onward)   Start      Dose/Rate Route Frequency Ordered Stop   10/26/20 1730  cefTRIAXone (ROCEPHIN) 2 g in sodium chloride 0.9 % 100 mL IVPB  Status:  Discontinued        2 g 200 mL/hr over 30 Minutes Intravenous Every 24 hours 10/26/20 1636 10/29/20 0942   10/23/20 1000  remdesivir 100 mg in sodium chloride 0.9 % 100 mL IVPB       "Followed by" Linked Group Details   100 mg 200 mL/hr over 30 Minutes Intravenous Daily 10/22/20 1154 10/26/20 1128   10/22/20 1200  remdesivir 100 mg in sodium chloride 0.9 % 100 mL IVPB       "Followed by" Linked Group Details   100 mg 200 mL/hr over 30 Minutes Intravenous Every 30 min 10/22/20 1154 10/22/20 1436   10/22/20 1000  remdesivir 100 mg in sodium chloride 0.9 % 100 mL IVPB  Status:  Discontinued        100 mg 200 mL/hr over 30 Minutes Intravenous Daily 10/21/20 1353 10/21/20 1418   10/21/20 1430  remdesivir 100 mg in sodium chloride 0.9 % 100 mL IVPB  Status:  Discontinued        100 mg 200 mL/hr over 30 Minutes Intravenous Every 1 hr x 2 10/21/20 1353 10/21/20 1418      Subjective: Patient moved from the ICU to regular room.  Upon arrival, patient noted to be hypertensive, somnolent.  He received IV fluids bolus and subsequently became more responsive  Objective: Vitals:   11/03/20 1233 11/03/20 1309 11/03/20 1311 11/03/20 1723  BP: (!) 83/59 104/63 125/73 (!) 113/58  Pulse: 63 91 82 67  Resp:      Temp:  TempSrc:      SpO2: 90% 99%    Weight:      Height:        Intake/Output Summary (Last 24 hours) at 11/03/2020 2012 Last data filed at 11/03/2020 1735 Gross per 24 hour  Intake 680 ml  Output 425 ml  Net 255 ml   Filed Weights   10/31/20 0504 11/02/20 0408 11/03/20 0526  Weight: 100.6 kg 97.6 kg 94.6 kg    Examination:  General exam: Somnolent, wakes up to voice Respiratory system: Clear to auscultation. Respiratory effort normal. Cardiovascular system: Irregular. No murmurs, rubs, gallops. Gastrointestinal system: Abdomen is nondistended,  soft and nontender. No organomegaly or masses felt. Normal bowel sounds heard. Central nervous system: No focal neurological deficits. Extremities: No C/C/E, +pedal pulses Skin: No rashes, lesions or ulcers Psychiatry: He is somnolent today     Data Reviewed: I have personally reviewed following labs and imaging studies  CBC: Recent Labs  Lab 10/30/20 0527 10/31/20 0429 11/01/20 0344 11/02/20 0445 11/03/20 0728  WBC 11.8* 12.6* 10.3 9.6 10.1  HGB 8.4* 7.8* 8.0* 8.6* 8.7*  HCT 29.3* 26.5* 26.0* 27.8* 29.7*  MCV 103.5* 98.9 97.7 100.7* 102.1*  PLT 221 235 260 328 671   Basic Metabolic Panel: Recent Labs  Lab 10/28/20 0420 10/28/20 1154 10/28/20 2033 10/29/20 0456 10/29/20 0731 10/29/20 1912 10/30/20 0527 10/31/20 0429 11/01/20 0344 11/02/20 0640 11/03/20 0728  NA 157*  --   --   --    < >  --  142 142 147* 145 148*  K 5.6*  --   --   --    < >  --  4.7 4.0 3.9 4.2 4.6  CL 121*  --   --   --    < >  --  108 107 111 109 111  CO2 27  --   --   --    < >  --  24 25 26 28 29   GLUCOSE 163*  --   --   --    < >  --  230* 167* 126* 126* 125*  BUN 114*  --   --   --    < >  --  112* 126* 118* 96* 118*  CREATININE 2.87*  --   --   --    < >  --  2.84* 2.64* 2.19* 2.31* 2.52*  CALCIUM 8.2*  --   --   --    < >  --  7.7* 7.7* 8.2* 8.4* 8.6*  MG 2.9*  --   --   --   --   --  2.5*  --   --   --   --   PHOS  --  3.6 4.0 3.7  --  3.9  --   --   --   --   --    < > = values in this interval not displayed.   GFR: Estimated Creatinine Clearance: 29 mL/min (A) (by C-G formula based on SCr of 2.52 mg/dL (H)). Liver Function Tests: Recent Labs  Lab 10/28/20 0420 10/29/20 0731 10/30/20 0527 10/31/20 0429 11/02/20 0640  AST 35 48* 34 38 40  ALT 65* 87* 81* 89* 125*  ALKPHOS 35* 38 34* 31* 35*  BILITOT 0.8 0.7 0.7 0.7 0.8  PROT 6.2* 6.2* 5.8* 5.8* 6.2*  ALBUMIN 2.2* 2.1* 2.1* 2.2* 2.6*   No results for input(s): LIPASE, AMYLASE in the last 168 hours. Recent Labs  Lab  10/28/20 [phone]  10/29/20 0731 10/30/20 0527  AMMONIA 46* 33 38*   Coagulation Profile: No results for input(s): INR, PROTIME in the last 168 hours. Cardiac Enzymes: No results for input(s): CKTOTAL, CKMB, CKMBINDEX, TROPONINI in the last 168 hours. BNP (last 3 results) No results for input(s): PROBNP in the last 8760 hours. HbA1C: No results for input(s): HGBA1C in the last 72 hours. CBG: Recent Labs  Lab 11/02/20 2334 11/03/20 0421 11/03/20 0808 11/03/20 1104 11/03/20 1729  GLUCAP 114* 110* 110* 128* 101*   Lipid Profile: No results for input(s): CHOL, HDL, LDLCALC, TRIG, CHOLHDL, LDLDIRECT in the last 72 hours. Thyroid Function Tests: No results for input(s): TSH, T4TOTAL, FREET4, T3FREE, THYROIDAB in the last 72 hours. Anemia Panel: Recent Labs    11/02/20 0445  FERRITIN 419*  TIBC 313  IRON 268*   Sepsis Labs: No results for input(s): PROCALCITON, LATICACIDVEN in the last 168 hours.  Recent Results (from the past 240 hour(s))  Culture, blood (routine x 2)     Status: None   Collection Time: 10/27/20  8:55 AM   Specimen: BLOOD LEFT HAND  Result Value Ref Range Status   Specimen Description   Final    BLOOD LEFT HAND BOTTLES DRAWN AEROBIC AND ANAEROBIC   Special Requests Blood Culture adequate volume  Final   Culture   Final    NO GROWTH 5 DAYS Performed at [facility], [address] [place], [place] [address]    Report Status 11/01/2020 FINAL  Final  Culture, blood (routine x 2)     Status: None   Collection Time: 10/27/20  8:58 AM   Specimen: BLOOD LEFT ARM  Result Value Ref Range Status   Specimen Description BLOOD LEFT ARM BOTTLES DRAWN AEROBIC AND ANAEROBIC  Final   Special Requests Blood Culture adequate volume  Final   Culture   Final    NO GROWTH 5 DAYS Performed at [facility], [address] [place], [place] [address]    Report Status 11/01/2020 FINAL  Final         Radiology Studies: No results found.      Scheduled Meds: .  (feeding supplement) PROSource Plus  30 mL Oral BID BM  . vitamin C  500 mg Oral Daily  . baricitinib  2 mg Oral Daily  . carvedilol  6.25 mg Per Tube BID WC  . Chlorhexidine Gluconate Cloth  6 each Topical Daily  . diltiazem  60 mg Oral Q8H  . feeding supplement  237 mL Oral TID BM  . pantoprazole (PROTONIX) IV  40 mg Intravenous Q12H  . zinc sulfate  220 mg Oral Daily   Continuous Infusions: . sodium chloride 100 mL/hr at 11/03/20 1735  . dextrose Stopped (11/02/20 0444)     LOS: 13 days    Time spent: 35 minutes    Kathie Dike, MD [facility]  If 7PM-7AM, please contact night-coverage [email or web address] 11/03/2020, 8:12 PM

## 2020-11-03 NOTE — Progress Notes (Signed)
Patient made it through the majority of the night without mittens on but continues to remove all monitors and devices. Patient has pulled out another IV and multiple attempts to obtain another were unsuccesful. Patient is now in safety mitts again as he continues to remove and pull off clothing and treatment devices.

## 2020-11-04 DIAGNOSIS — Z515 Encounter for palliative care: Secondary | ICD-10-CM | POA: Diagnosis not present

## 2020-11-04 DIAGNOSIS — J1282 Pneumonia due to coronavirus disease 2019: Secondary | ICD-10-CM | POA: Diagnosis not present

## 2020-11-04 DIAGNOSIS — J9621 Acute and chronic respiratory failure with hypoxia: Secondary | ICD-10-CM | POA: Diagnosis not present

## 2020-11-04 DIAGNOSIS — D649 Anemia, unspecified: Secondary | ICD-10-CM

## 2020-11-04 DIAGNOSIS — Z7189 Other specified counseling: Secondary | ICD-10-CM | POA: Diagnosis not present

## 2020-11-04 DIAGNOSIS — U071 COVID-19: Secondary | ICD-10-CM | POA: Diagnosis not present

## 2020-11-04 LAB — BASIC METABOLIC PANEL
Anion gap: 11 (ref 5–15)
BUN: 103 mg/dL — ABNORMAL HIGH (ref 8–23)
CO2: 26 mmol/L (ref 22–32)
Calcium: 8.1 mg/dL — ABNORMAL LOW (ref 8.9–10.3)
Chloride: 110 mmol/L (ref 98–111)
Creatinine, Ser: 2.31 mg/dL — ABNORMAL HIGH (ref 0.61–1.24)
GFR, Estimated: 29 mL/min — ABNORMAL LOW (ref >60)
Glucose, Bld: 93 mg/dL (ref 70–99)
Potassium: 4.5 mmol/L (ref 3.5–5.1)
Sodium: 147 mmol/L — ABNORMAL HIGH (ref 135–145)

## 2020-11-04 LAB — CBC
HCT: 24.8 % — ABNORMAL LOW (ref 39.0–52.0)
HCT: 24.8 % — ABNORMAL LOW (ref 39.0–52.0)
Hemoglobin: 7.1 g/dL — ABNORMAL LOW (ref 13.0–17.0)
Hemoglobin: 7 g/dL — ABNORMAL LOW (ref 13.0–17.0)
MCH: 30.1 pg (ref 26.0–34.0)
MCH: 30.3 pg (ref 26.0–34.0)
MCHC: 28.6 g/dL — ABNORMAL LOW (ref 30.0–36.0)
MCHC: 28.2 g/dL — ABNORMAL LOW (ref 30.0–36.0)
MCV: 105.1 fL — ABNORMAL HIGH (ref 80.0–100.0)
MCV: 107.4 fL — ABNORMAL HIGH (ref 80.0–100.0)
Platelets: 252 10*3/uL (ref 150–400)
Platelets: 224 10*3/uL (ref 150–400)
RBC: 2.36 MIL/uL — ABNORMAL LOW (ref 4.22–5.81)
RBC: 2.31 MIL/uL — ABNORMAL LOW (ref 4.22–5.81)
RDW: 15.2 % (ref 11.5–15.5)
RDW: 15.5 % (ref 11.5–15.5)
WBC: 9.1 10*3/uL (ref 4.0–10.5)
WBC: 8.7 10*3/uL (ref 4.0–10.5)
nRBC: 0.4 % — ABNORMAL HIGH (ref 0.0–0.2)
nRBC: 0.8 % — ABNORMAL HIGH (ref 0.0–0.2)

## 2020-11-04 LAB — GLUCOSE, CAPILLARY
Glucose-Capillary: 107 mg/dL — ABNORMAL HIGH (ref 70–99)
Glucose-Capillary: 121 mg/dL — ABNORMAL HIGH (ref 70–99)
Glucose-Capillary: 122 mg/dL — ABNORMAL HIGH (ref 70–99)
Glucose-Capillary: 124 mg/dL — ABNORMAL HIGH (ref 70–99)
Glucose-Capillary: 90 mg/dL (ref 70–99)
Glucose-Capillary: 92 mg/dL (ref 70–99)

## 2020-11-04 MED ORDER — SODIUM CHLORIDE 0.9% IV SOLUTION: Freq: Once | INTRAVENOUS | Status: AC

## 2020-11-04 MED ORDER — LACTATED RINGERS IV BOLUS
500.0000 mL | Freq: Once | INTRAVENOUS | Status: AC
  Administered 2020-11-04: 500 mL via INTRAVENOUS

## 2020-11-04 NOTE — Progress Notes (Addendum)
Pt resting with eyes open. Water and ensure given. Pt did not want any food. mildy less weak at this time. Arms not as weak and pt able to give drink to self. Talking more. No needs expressed at this time.

## 2020-11-04 NOTE — Progress Notes (Signed)
MD ordered 530ml LR bolus. Will continue to monitor as appropriate.

## 2020-11-04 NOTE — Progress Notes (Signed)
MD notified of patients blood pressure of 90/54. Patient is alert and appropriate

## 2020-11-04 NOTE — Progress Notes (Signed)
Pt resting. Awake. Nad. Lab in for phlebotomy

## 2020-11-04 NOTE — Progress Notes (Signed)
    Subjective: Difficult historian. Denies abdominal pain. States he is cold. Drowsy. Per nursing staff, no overt GI bleeding. Received 1/2 liter bolus overnight due to BP 90/54. Hypotension improved a of this morning. 2 liters O2 nasal cannula. Received bolus yesterday as well.   Objective: Vital signs in last 24 hours: Temp:  [97.4 F (36.3 C)-98.4 F (36.9 C)] 98 F (36.7 C) (01/10 0501) Pulse Rate:  [53-95] 88 (01/10 0501) Resp:  [16-26] 20 (01/10 0501) BP: (82-153)/(52-105) 110/52 (01/10 0501) SpO2:  [90 %-100 %] 98 % (01/10 0501) Weight:  [94.7 kg] 94.7 kg (01/10 0500) Last BM Date: 11/01/20 General:   Somnolent. Opens to verbal and physical stimuli but does not engage fully. Somnolent.  Head:  Normocephalic and atraumatic. Lungs: scattered rhonchi. 2 liters nasal cannula.  Abdomen:  Bowel sounds present, soft, non-tender, non-distended. No HSM or hernias noted. No rebound or guarding. No masses appreciated  Neurologic:  Unable to assess orientation  Intake/Output from previous day: 01/09 0701 - 01/10 0700 In: 1779.5 [P.O.:180; I.V.:599.5; IV Piggyback:1000] Out: 450 [Urine:450] Intake/Output this shift: No intake/output data recorded.  Lab Results: Recent Labs    11/02/20 0445 11/03/20 0728 11/04/20 0538  WBC 9.6 10.1 9.1  HGB 8.6* 8.7* 7.1*  HCT 27.8* 29.7* 24.8*  PLT 328 324 252   BMET Recent Labs    11/02/20 0640 11/03/20 0728  NA 145 148*  K 4.2 4.6  CL 109 111  CO2 28 29  GLUCOSE 126* 125*  BUN 96* 118*  CREATININE 2.31* 2.52*  CALCIUM 8.4* 8.6*   LFT Recent Labs    11/02/20 0640  PROT 6.2*  ALBUMIN 2.6*  AST 40  ALT 125*  ALKPHOS 35*  BILITOT 0.8   Lab Results  Component Value Date   IRON 268 (H) 11/02/2020   TIBC 313 11/02/2020   FERRITIN 419 (H) 11/02/2020    Assessment: 75 year old male admitted with acute respiratory failure in setting of COVID-19 pneumonia, found to have heme positive stool, progressively worsening anemia,  with Hgb consistently in the 8 range for past few days but dropped to 7.1 this morning. No overt GI bleeding per nursing staff. Limited historian and somnolent this morning although wakens to verbal stimuli. Underlying cognitive deficits at baseline and early signs of dementia have been reported by family per epic notes.   Appears he was hypotensive yesterday and received IV bolus X 2 (midday and overnight). Could have dilutional effect on Hgb. Will recheck around 1500 today. Will hold off on any endoscopic procedures unless overtly bleeding or requiring transfusions. Ferritin elevated at 419, iron elevated at 268, and sats 86. Anemia multifactorial in setting of acute illness, CKD.   No prior EGD or colonoscopy. For now, plan on outpatient evaluation unless clinically changes with need for urgent inpatient evaluation.     Plan: Repeat H/H at 1500 Continue PPI IV BID Plans for outpatient colonoscopy/EGD unless clinical condition changes Avoid NSAIDs   Annitta Needs, PhD, ANP-BC [facility]    LOS: 14 days    11/04/2020, 8:37 AM

## 2020-11-04 NOTE — Progress Notes (Signed)
Pt alert/oriented to some. gen weakness noted. Follows simple commands. Minimal swelling noted around ankles, nonpitting. bue swelling noted with right arm worse than left. Left upper arm covered with bandage due to abrasion. Bruising noted to abd and bue. Mild abd breathing noted. Pt denies any pain or sob. Pt has male ext cath on and is working well. Pt only ate couple bites of eggs. Drank 3 cups of water and his ensure.

## 2020-11-04 NOTE — Progress Notes (Signed)
Pt resting. Nad. Pt dry at this time. Water given. Pt had pulled gown off since right mitten has been removed and [place] not completely in nose. Fixed [place] and educated pt. Given another blanket due to c/o being cold. Lights off per pt request. nad

## 2020-11-04 NOTE — Progress Notes (Signed)
PROGRESS NOTE    Darren Burke  WVP:[id number] DOB: Jan 22, 1946 DOA: 10/21/2020 PCP: Rosita Fire, MD   Brief Narrative:  Darren Burke a 75 y.o.malewith medical history significantfor longtimetobacco use, COPD(emphysema),GERD,stage IV CKD, chronic constipation, hypertension, paroxysmal atrial flutter who presented to the emergency department from PCP office with shortness of breath and hypoxia. He was noted to have a pulse ox of 80 to 81% on room air. Patient was admitted with acute hypoxemic respiratory failure secondary to Covid pneumonia and is unvaccinated. He and his family were unsure of starting remdesivir, but would now like to start this medication and therefore it will be initiated 12/28. His hypoxemiais currently improved to 1 L nasal cannula oxygen. He is having worsening electrolyte abnormalities and appears to be catatonic on 1/2, but is improving.  He was noted to have low blood pressure readings overnight on 1/3 and continues to have significant hyponatremia and hypokalemia.  NG tube to be placed for free water flushes and feedings.  CT head with no acute findings.  ABG with hypercapnia noted with BiPAP started.  Also noted to be in atrial fibrillation with RVR and started on Cardizem drip.   Assessment & Plan:   Principal Problem:   Pneumonia due to COVID-19 virus Active Problems:   Pulmonary emphysema ([place])   COPD with acute exacerbation (HCC)   SOB (shortness of breath)   Hypoxia   Acute and chronic respiratory failure with hypoxia (HCC)   HTN (hypertension)   Paroxysmal atrial flutter (HCC)   Anemia   CKD (chronic kidney disease), stage IV (HCC)   Transaminitis   Melena   Gastrointestinal hemorrhage   1. Acute respiratory failure with hypoxia secondary to Covid pneumonia-patient is unvaccinated. He has underlying chronic lung disease and other co-morbidities. He remains HighRisk for adverse outcomeandseverelungparenchymal injury. He and  his family initially refused the use of remdesivir;thenon 10/22/2020 accepted to be treated along with the use of steroids. Patient with good response to the initiation of barcitiniband currently has been weaned off oxygen. Will continue current management, supportive care. Has completed remdesivir infusion. 2. Acute encephalopathy-multifactorial.  Family reports that he may have some baseline cognitive deficits and early signs of dementia at baseline.  CT head found to be negative.  Suspect that worsening mental status was related to acute infectious process/metabolic issues.  Limit sedating medications.  Overall mental status appears to be improving 3. 1 set blood culture positive for micrococcus likely contaminant.  Repeat blood cultures negative.  Rocephin was discontinued 4. Low TSH.  Free T4 is normal.  Repeat labs in 3 to 4 weeks as an outpatient 5. Chronic stage IV kidney disease -his creatinine is stable overall and will continue following trend. Continue minimizing nephrotoxic agents and avoiding hypotension.  Patient has markedly elevated BUN.  His FOBT is also positive.  Concern for GI bleeding. 6. GI bleeding.  Noted to have FOBT positive and dark stools.  BUN is markedly elevated.  Aspirin and heparin have been discontinued.GI has seen the patient and will plan on endoscopic evaluation if he has gross bleeding or a further drop in hemoglobin necessitating transfusion. He is on PPI 7. COPD with acute exacerbation secondary to Covid infection treated with IV steroids and bronchodilators as mentioned above.  Overall respiratory status has stabilized and steroids have been discontinued 8. Anemia in CKD-follow CBC closely; patient was noted to have dark stools which were heme positive.  Hemoglobin did trend down from 12.2 on admission, down to a nadir  of  7.0. Will transfuse 1 unit PRBC. Continue to follow hemoglobin trend 9. Paroxysmal atrial flutter-currently with RVR.  He is on carvedilol  and diltiazem for rate control. Since he is having bursts of tachycardia, will adjust diltiazem dosing.  Chadsvasc score of 2 for hypertension and age.  Not a candidate for anticoagulation at this time due to concerns for GI bleeding. 10. Chronic constipation - laxative therapy ordered. Follow response and further improve bowel management as required. 11. Transaminitis - likely reactive from current Covid infection. Currently stable.  Continue to monitor. 12. Hypernatremia/hyperkalemia: Improved after receiving hypotonic fluids and Lokelma.  Continue to monitor 13. Goals of care.  Detailed discussion with the patient's daughter regarding his expected prognosis.  Recommended DNR status and she is in agreement.  She wishes to continue with current treatments, but if he were to develop cardiac/respiratory arrest, she would not want heroic measures  DVT prophylaxis: SCDs Code Status:DNR Family Communication:Discussed with daughterat the bedside 1/9 Disposition Plan: Status is: Inpatient  Remains inpatient appropriate because:IV treatments appropriate due to intensity of illness or inability to take PO and Inpatient level of care appropriate due to severity of illness   Dispo: The patient is from: Home Anticipated d/c is to: TBD Anticipated d/c date is: 3 daysor so. Patient currently is not medically stable to d/c.Patient will be continued on  Barcitinib. Has completed Remdesivir infusion. Will provide gentle fluid resuscitation to assist with hypernatremia anddiscontinue use of Seroquel.  Continue to monitor electrolytes.    Consultants:  Palliative care  Gastroenterology  Procedures:  NGT 1/3>1/6  See below; CT head 1/4 with no significant findings  Antimicrobials:  Anti-infectives (From admission, onward)   Start     Dose/Rate Route Frequency Ordered Stop   10/26/20 1730  cefTRIAXone (ROCEPHIN) 2 g in sodium chloride 0.9 %  100 mL IVPB  Status:  Discontinued        2 g 200 mL/hr over 30 Minutes Intravenous Every 24 hours 10/26/20 1636 10/29/20 0942   10/23/20 1000  remdesivir 100 mg in sodium chloride 0.9 % 100 mL IVPB       "Followed by" Linked Group Details   100 mg 200 mL/hr over 30 Minutes Intravenous Daily 10/22/20 1154 10/26/20 1128   10/22/20 1200  remdesivir 100 mg in sodium chloride 0.9 % 100 mL IVPB       "Followed by" Linked Group Details   100 mg 200 mL/hr over 30 Minutes Intravenous Every 30 min 10/22/20 1154 10/22/20 1436   10/22/20 1000  remdesivir 100 mg in sodium chloride 0.9 % 100 mL IVPB  Status:  Discontinued        100 mg 200 mL/hr over 30 Minutes Intravenous Daily 10/21/20 1353 10/21/20 1418   10/21/20 1430  remdesivir 100 mg in sodium chloride 0.9 % 100 mL IVPB  Status:  Discontinued        100 mg 200 mL/hr over 30 Minutes Intravenous Every 1 hr x 2 10/21/20 1353 10/21/20 1418      Subjective: Patient is somnolent today, but wakes up to voice. Denies any significant pain or shortness of breath.  Objective: Vitals:   11/04/20 0845 11/04/20 1130 11/04/20 1349 11/04/20 2011  BP: 126/73  (!) 103/59 (!) 97/56  Pulse: 82  69 69  Resp: (!) 22  20 18   Temp: 98.2 F (36.8 C)  98.3 F (36.8 C) 98.1 F (36.7 C)  TempSrc: Axillary  Oral Oral  SpO2: 93%  100% 95%  Weight:  99.7 kg    Height:        Intake/Output Summary (Last 24 hours) at 11/04/2020 2107 Last data filed at 11/04/2020 1700 Gross per 24 hour  Intake 1579.51 ml  Output 1150 ml  Net 429.51 ml   Filed Weights   11/03/20 0526 11/04/20 0500 11/04/20 1130  Weight: 94.6 kg 94.7 kg 99.7 kg    Examination:  General exam: Somnolent, but wakes up to voice, no distress Respiratory system: Clear to auscultation. Respiratory effort normal. Cardiovascular system:RRR. No murmurs, rubs, gallops. Gastrointestinal system: Abdomen is nondistended, soft and nontender. No organomegaly or masses felt. Normal bowel sounds  heard. Central nervous system: No focal neurological deficits. Extremities: 1+ edema bilaterally Skin: No rashes, lesions or ulcers Psychiatry: Somnolent, does not appear to be agitated   Data Reviewed: I have personally reviewed following labs and imaging studies  CBC: Recent Labs  Lab 11/01/20 0344 11/02/20 0445 11/03/20 0728 11/04/20 0538 11/04/20 1536  WBC 10.3 9.6 10.1 9.1 8.7  HGB 8.0* 8.6* 8.7* 7.1* 7.0*  HCT 26.0* 27.8* 29.7* 24.8* 24.8*  MCV 97.7 100.7* 102.1* 105.1* 107.4*  PLT 260 328 324 252 191   Basic Metabolic Panel: Recent Labs  Lab 10/29/20 0456 10/29/20 0731 10/29/20 1912 10/30/20 0527 10/31/20 0429 11/01/20 0344 11/02/20 0640 11/03/20 0728 11/04/20 0538  NA  --    < >  --  142 142 147* 145 148* 147*  K  --    < >  --  4.7 4.0 3.9 4.2 4.6 4.5  CL  --    < >  --  108 107 111 109 111 110  CO2  --    < >  --  24 25 26 28 29 26   GLUCOSE  --    < >  --  230* 167* 126* 126* 125* 93  BUN  --    < >  --  112* 126* 118* 96* 118* 103*  CREATININE  --    < >  --  2.84* 2.64* 2.19* 2.31* 2.52* 2.31*  CALCIUM  --    < >  --  7.7* 7.7* 8.2* 8.4* 8.6* 8.1*  MG  --   --   --  2.5*  --   --   --   --   --   PHOS 3.7  --  3.9  --   --   --   --   --   --    < > = values in this interval not displayed.   GFR: Estimated Creatinine Clearance: 32.5 mL/min (A) (by C-G formula based on SCr of 2.31 mg/dL (H)). Liver Function Tests: Recent Labs  Lab 10/29/20 0731 10/30/20 0527 10/31/20 0429 11/02/20 0640  AST 48* 34 38 40  ALT 87* 81* 89* 125*  ALKPHOS 38 34* 31* 35*  BILITOT 0.7 0.7 0.7 0.8  PROT 6.2* 5.8* 5.8* 6.2*  ALBUMIN 2.1* 2.1* 2.2* 2.6*   No results for input(s): LIPASE, AMYLASE in the last 168 hours. Recent Labs  Lab 10/29/20 0731 10/30/20 0527  AMMONIA 33 38*   Coagulation Profile: No results for input(s): INR, PROTIME in the last 168 hours. Cardiac Enzymes: No results for input(s): CKTOTAL, CKMB, CKMBINDEX, TROPONINI in the last 168  hours. BNP (last 3 results) No results for input(s): PROBNP in the last 8760 hours. HbA1C: No results for input(s): HGBA1C in the last 72 hours. CBG: Recent Labs  Lab 11/04/20 0506 11/04/20 0724 11/04/20 1123 11/04/20 1615 11/04/20 2012  GLUCAP 107* 90 124* 121* 92   Lipid Profile: No results for input(s): CHOL, HDL, LDLCALC, TRIG, CHOLHDL, LDLDIRECT in the last 72 hours. Thyroid Function Tests: No results for input(s): TSH, T4TOTAL, FREET4, T3FREE, THYROIDAB in the last 72 hours. Anemia Panel: Recent Labs    11/02/20 0445  FERRITIN 419*  TIBC 313  IRON 268*   Sepsis Labs: No results for input(s): PROCALCITON, LATICACIDVEN in the last 168 hours.  Recent Results (from the past 240 hour(s))  Culture, blood (routine x 2)     Status: None   Collection Time: 10/27/20  8:55 AM   Specimen: BLOOD LEFT HAND  Result Value Ref Range Status   Specimen Description   Final    BLOOD LEFT HAND BOTTLES DRAWN AEROBIC AND ANAEROBIC   Special Requests Blood Culture adequate volume  Final   Culture   Final    NO GROWTH 5 DAYS Performed at [facility], [address] [place], [place] [address]    Report Status 11/01/2020 FINAL  Final  Culture, blood (routine x 2)     Status: None   Collection Time: 10/27/20  8:58 AM   Specimen: BLOOD LEFT ARM  Result Value Ref Range Status   Specimen Description BLOOD LEFT ARM BOTTLES DRAWN AEROBIC AND ANAEROBIC  Final   Special Requests Blood Culture adequate volume  Final   Culture   Final    NO GROWTH 5 DAYS Performed at [facility], [address], [place], [place] [address]    Report Status 11/01/2020 FINAL  Final         Radiology Studies: No results found.      Scheduled Meds: . (feeding supplement) PROSource Plus  30 mL Oral BID BM  . sodium chloride   Intravenous Once  . vitamin C  500 mg Oral Daily  . baricitinib  2 mg Oral Daily  . carvedilol  6.25 mg Per Tube BID WC  . Chlorhexidine Gluconate Cloth  6 each Topical Daily   . diltiazem  60 mg Oral Q8H  . feeding supplement  237 mL Oral TID BM  . pantoprazole (PROTONIX) IV  40 mg Intravenous Q12H  . zinc sulfate  220 mg Oral Daily   Continuous Infusions: . sodium chloride 100 mL/hr at 11/04/20 2024  . dextrose Stopped (11/02/20 0444)     LOS: 14 days    Time spent: 35 minutes    Kathie Dike, MD [facility]  If 7PM-7AM, please contact night-coverage [email or web address] 11/04/2020, 9:07 PM

## 2020-11-05 DIAGNOSIS — Z7189 Other specified counseling: Secondary | ICD-10-CM | POA: Diagnosis not present

## 2020-11-05 DIAGNOSIS — Z515 Encounter for palliative care: Secondary | ICD-10-CM | POA: Diagnosis not present

## 2020-11-05 DIAGNOSIS — D649 Anemia, unspecified: Secondary | ICD-10-CM | POA: Diagnosis not present

## 2020-11-05 DIAGNOSIS — U071 COVID-19: Secondary | ICD-10-CM | POA: Diagnosis not present

## 2020-11-05 DIAGNOSIS — J1282 Pneumonia due to coronavirus disease 2019: Secondary | ICD-10-CM | POA: Diagnosis not present

## 2020-11-05 LAB — BASIC METABOLIC PANEL
Anion gap: 9 (ref 5–15)
BUN: 98 mg/dL — ABNORMAL HIGH (ref 8–23)
CO2: 25 mmol/L (ref 22–32)
Calcium: 7.8 mg/dL — ABNORMAL LOW (ref 8.9–10.3)
Chloride: 106 mmol/L (ref 98–111)
Creatinine, Ser: 2.35 mg/dL — ABNORMAL HIGH (ref 0.61–1.24)
GFR, Estimated: 29 mL/min — ABNORMAL LOW (ref >60)
Glucose, Bld: 104 mg/dL — ABNORMAL HIGH (ref 70–99)
Potassium: 4.3 mmol/L (ref 3.5–5.1)
Sodium: 140 mmol/L (ref 135–145)

## 2020-11-05 LAB — CBC
HCT: 23.5 % — ABNORMAL LOW (ref 39.0–52.0)
HCT: 25.4 % — ABNORMAL LOW (ref 39.0–52.0)
Hemoglobin: 6.7 g/dL — CL (ref 13.0–17.0)
Hemoglobin: 7.6 g/dL — ABNORMAL LOW (ref 13.0–17.0)
MCH: 30.2 pg (ref 26.0–34.0)
MCH: 30.2 pg (ref 26.0–34.0)
MCHC: 28.5 g/dL — ABNORMAL LOW (ref 30.0–36.0)
MCHC: 29.9 g/dL — ABNORMAL LOW (ref 30.0–36.0)
MCV: 100.8 fL — ABNORMAL HIGH (ref 80.0–100.0)
MCV: 105.9 fL — ABNORMAL HIGH (ref 80.0–100.0)
Platelets: 229 10*3/uL (ref 150–400)
Platelets: 238 10*3/uL (ref 150–400)
RBC: 2.22 MIL/uL — ABNORMAL LOW (ref 4.22–5.81)
RBC: 2.52 MIL/uL — ABNORMAL LOW (ref 4.22–5.81)
RDW: 15.6 % — ABNORMAL HIGH (ref 11.5–15.5)
RDW: 18.7 % — ABNORMAL HIGH (ref 11.5–15.5)
WBC: 7.3 10*3/uL (ref 4.0–10.5)
WBC: 8.4 10*3/uL (ref 4.0–10.5)
nRBC: 0.4 % — ABNORMAL HIGH (ref 0.0–0.2)
nRBC: 0.5 % — ABNORMAL HIGH (ref 0.0–0.2)

## 2020-11-05 LAB — GLUCOSE, CAPILLARY
Glucose-Capillary: 100 mg/dL — ABNORMAL HIGH (ref 70–99)
Glucose-Capillary: 101 mg/dL — ABNORMAL HIGH (ref 70–99)
Glucose-Capillary: 107 mg/dL — ABNORMAL HIGH (ref 70–99)
Glucose-Capillary: 139 mg/dL — ABNORMAL HIGH (ref 70–99)
Glucose-Capillary: 87 mg/dL (ref 70–99)
Glucose-Capillary: 88 mg/dL (ref 70–99)

## 2020-11-05 LAB — ABO/RH: ABO/RH(D): A POS

## 2020-11-05 LAB — PREPARE RBC (CROSSMATCH)

## 2020-11-05 NOTE — Progress Notes (Signed)
Palliative:  Mr. Seliga is lying quietly in bed.  He appears acutely/chronically ill and quite frail.  He will briefly open his eyes when I asked him to, but does not keep eye contact.  I encouraged him to speak to me, but he will not speak.  I am not sure he is able to make his basic needs known.  There is no family at bedside at this time.  I offer Mr. Cherry something to drink.  He is able to take a few sips of water without signs and symptoms of aspiration, but refuses more than just a few sips.  I greatly encouraged him to participate in care, to stay awake as much as possible throughout the day.  He does not respond.  As I am leaving the room, I meet with Mr. [place] daughter, [name].  [name] and I talk about Mr. Leggio's acute health concerns, his participation in care therapies.  [name] states that although she thinks he might benefit from short-term rehab, the facilities who are offering a bed out are too far away.  She states that at this point she would prefer to take him home with home health services.  Conference with attending, bedside nursing staff, transition of care team related to patient condition, needs, goals of care.  Plan: Continue to treat the treatable but no CPR or intubation.  Home with home health services when able.  Would benefit from outpatient palliative to follow.   40 minutes  Quinn Axe, NP Palliative Medicine Team  Team Phone [phone] Greater than 50% of this time was spent counseling and coordinating care related to above assessment and plan.

## 2020-11-05 NOTE — TOC Progression Note (Addendum)
Transition of Care [facility]) - Progression Note    Patient Details  Name: Darren Burke MRN: [id number] Date of Birth: 1945/11/14  Transition of [facility]) CM/SW Contact  Ihor Gully, LCSW Phone Number: 11/05/2020, 2:21 PM  Clinical Narrative:    Patient from home alone. Admitted COVID+. Daughter [name] spends the day with patient and states that another daughter will assist her in his care upon discharge.  At baseline, patient ambulates with a walker, drives, and completes ADLS. He also has a shower chair in the home.  PT recommendation of SNF discussed. [name] declines SNF. Request HHPT, RN. Request hospital bed, BSC, and script for condom caths. Attending made aware.  DME and [place] providers discussed. Wants Alvis Lemmings for [place] and [employer] for DME.   Expected Discharge Plan: [place] Barriers to Discharge: Continued Medical Work up  Expected Discharge Plan and Services Expected Discharge Plan: [place] In-house Referral: Clinical Social Work Discharge Planning Services: CM Consult Post Acute Care Choice: [place] arrangements for the past 2 months: Apartment                 DME Arranged: Hospital bed,3-N-1,Other see comment (prescription for condom caths) DME Agency: [place]       HH Arranged: RN,PT [place] Agency: [place] Date [facility] [place] Agency Contacted: 11/05/20 Time [place]: [place] Representative spoke with at [place]: [place] ([place]) Interventions    Readmission Risk Interventions Readmission Risk Prevention Plan 10/24/2020  Transportation Screening Complete  HRI or [place] Complete  Social Work Consult for [place] Planning/Counseling [place] Screening Not Applicable  Medication Review [employer]) Complete  Some recent data might be hidden

## 2020-11-05 NOTE — Progress Notes (Signed)
PROGRESS NOTE    Darren Burke  BUL:[id number] DOB: 07-14-1946 DOA: 10/21/2020 PCP: Rosita Fire, MD   Brief Narrative:  Darren Burke a 75 y.o.malewith medical history significantfor longtimetobacco use, COPD(emphysema),GERD,stage IV CKD, chronic constipation, hypertension, paroxysmal atrial flutter who presented to the emergency department from PCP office with shortness of breath and hypoxia. He was noted to have a pulse ox of 80 to 81% on room air. Patient was admitted with acute hypoxemic respiratory failure secondary to Covid pneumonia and is unvaccinated.  He was treated with remdesivir, steroids and baricitinib.  Overall hypoxemia significantly improved. He developed significant encephalopathy, which may be related to COVID versus hospital delirium.  Overall mental status appears to be improving at this time.  CT head with no acute findings.  He was also noted to be in atrial fibrillation and is currently rate controlled.  Hospital course further complicated by development of significant anemia.  He has elevated BUN and hemoglobin has drifted down since admission.  He was transfused 1 unit of PRBC on 1/10.  Concern for upper GI bleed.  GI following and considering EGD on 1/12   Assessment & Plan:   Principal Problem:   Pneumonia due to COVID-19 virus Active Problems:   Pulmonary emphysema ([place])   COPD with acute exacerbation (HCC)   SOB (shortness of breath)   Hypoxia   Acute and chronic respiratory failure with hypoxia (HCC)   HTN (hypertension)   Paroxysmal atrial flutter (HCC)   Anemia   CKD (chronic kidney disease), stage IV (HCC)   Transaminitis   Melena   Gastrointestinal hemorrhage   1. Acute respiratory failure with hypoxia secondary to Covid pneumonia-patient is unvaccinated. He has underlying chronic lung disease and other co-morbidities. He remains HighRisk for adverse outcomeandseverelungparenchymal injury. He and his family initially refused  the use of remdesivir;thenon 10/22/2020 accepted to be treated along with the use of steroids. Patient with good response to the initiation of barcitinib. Currently, he has completed his steroids, remdesivir and baricitinib course. 2. Acute encephalopathy-multifactorial.  Family reports that he may have some baseline cognitive deficits and early signs of dementia at baseline.  CT head found to be negative.  Suspect that worsening mental status was related to acute infectious process/metabolic issues.  Limit sedating medications.  Overall mental status appears to be improving 3. 1 set blood culture positive for micrococcus likely contaminant.  Repeat blood cultures negative.  Rocephin was discontinued 4. Low TSH.  Free T4 is normal.  Repeat labs in 3 to 4 weeks as an outpatient 5. Chronic stage IV kidney disease -Baseline creatinine approximately 2.5.  His creatinine is stable overall and will continue following trend. Continue minimizing nephrotoxic agents and avoiding hypotension.  Patient has markedly elevated BUN.  His FOBT is also positive.  Concern for GI bleeding. 6. GI bleeding.  Noted to have FOBT positive and dark stools.  BUN is markedly elevated.  Aspirin and heparin have been discontinued.  GI following and plans on possible EGD on 1/12. He is on PPI 7. COPD with acute exacerbation secondary to Covid infection treated with IV steroids and bronchodilators as mentioned above.  Overall respiratory status has stabilized and steroids have been discontinued 8. Anemia in CKD-follow CBC closely; patient was noted to have dark stools which were heme positive.  Hemoglobin did trend down from 12.2 on admission, down to a nadir of  6.7.  He was transfused 1 unit PRBC on 1/10 with improvement of hemoglobin to 7.6. Continue to follow  hemoglobin trend 9. Paroxysmal atrial flutter-currently with RVR.  He is on carvedilol and diltiazem for rate control.  Heart rate is currently stable Chadsvasc score of 2 for  hypertension and age.  Not a candidate for anticoagulation at this time due to concerns for GI bleeding. 10. Chronic constipation - laxative therapy ordered. Follow response and further improve bowel management as required. 11. Transaminitis - likely reactive from current Covid infection. Currently stable.  Continue to monitor. 12. Hypernatremia/hyperkalemia: Improved after receiving hypotonic fluids and Lokelma.  Continue to monitor 13. Goals of care.  Detailed discussion with the patient's daughter regarding his expected prognosis.  Recommended DNR status and she is in agreement.  She wishes to continue with current treatments, but if he were to develop cardiac/respiratory arrest, she would not want heroic measures  DVT prophylaxis: SCDs Code Status:DNR Family Communication:Discussed with daughterat the bedside 1/11 Disposition Plan: Status is: Inpatient  Remains inpatient appropriate because:IV treatments appropriate due to intensity of illness or inability to take PO and Inpatient level of care appropriate due to severity of illness   Dispo: The patient is from: Home Anticipated d/c is to: Home with home health Anticipated d/c date is: 2 daysor so. Patient currently is not medically stable to d/c. Currently undergoing work-up for possible GI bleeding   Consultants:  Palliative care  Gastroenterology  Procedures:  NGT 1/3>1/6  See below; CT head 1/4 with no significant findings  Antimicrobials:  Anti-infectives (From admission, onward)   Start     Dose/Rate Route Frequency Ordered Stop   10/26/20 1730  cefTRIAXone (ROCEPHIN) 2 g in sodium chloride 0.9 % 100 mL IVPB  Status:  Discontinued        2 g 200 mL/hr over 30 Minutes Intravenous Every 24 hours 10/26/20 1636 10/29/20 0942   10/23/20 1000  remdesivir 100 mg in sodium chloride 0.9 % 100 mL IVPB       "Followed by" Linked Group Details   100 mg 200 mL/hr over 30  Minutes Intravenous Daily 10/22/20 1154 10/26/20 1128   10/22/20 1200  remdesivir 100 mg in sodium chloride 0.9 % 100 mL IVPB       "Followed by" Linked Group Details   100 mg 200 mL/hr over 30 Minutes Intravenous Every 30 min 10/22/20 1154 10/22/20 1436   10/22/20 1000  remdesivir 100 mg in sodium chloride 0.9 % 100 mL IVPB  Status:  Discontinued        100 mg 200 mL/hr over 30 Minutes Intravenous Daily 10/21/20 1353 10/21/20 1418   10/21/20 1430  remdesivir 100 mg in sodium chloride 0.9 % 100 mL IVPB  Status:  Discontinued        100 mg 200 mL/hr over 30 Minutes Intravenous Every 1 hr x 2 10/21/20 1353 10/21/20 1418      Subjective: Patient wakes up to voice.  Denies any complaints.  He is not as interactive today.  P.o. intake has been relatively poor.  Objective: Vitals:   11/05/20 1254 11/05/20 1400 11/05/20 1754 11/05/20 2021  BP: 113/78 99/64 101/69 130/69  Pulse:  97 74 94  Resp:    18  Temp:  98.9 F (37.2 C)  98.4 F (36.9 C)  TempSrc:    Oral  SpO2:  97%  99%  Weight:      Height:        Intake/Output Summary (Last 24 hours) at 11/05/2020 2053 Last data filed at 11/05/2020 1847 Gross per 24 hour  Intake 2117.5 ml  Output 2000 ml  Net 117.5 ml   Filed Weights   11/04/20 0500 11/04/20 1130 11/05/20 0611  Weight: 94.7 kg 99.7 kg 98 kg    Examination:  General exam: Alert, awake, no distress Respiratory system: Coarse breath sounds bilaterally. Respiratory effort normal. Cardiovascular system:RRR. No murmurs, rubs, gallops. Gastrointestinal system: Abdomen is nondistended, soft and nontender. No organomegaly or masses felt. Normal bowel sounds heard. Central nervous system: No focal neurological deficits. Extremities: Trace edema bilaterally Skin: No rashes, lesions or ulcers Psychiatry: Somnolent but wakes up to voice, pleasant.    Data Reviewed: I have personally reviewed following labs and imaging studies  CBC: Recent Labs  Lab 11/03/20 0728  11/04/20 0538 11/04/20 1536 11/04/20 2334 11/05/20 0930  WBC 10.1 9.1 8.7 7.3 8.4  HGB 8.7* 7.1* 7.0* 6.7* 7.6*  HCT 29.7* 24.8* 24.8* 23.5* 25.4*  MCV 102.1* 105.1* 107.4* 105.9* 100.8*  PLT 324 252 224 238 564   Basic Metabolic Panel: Recent Labs  Lab 10/30/20 0527 10/31/20 0429 11/01/20 0344 11/02/20 0640 11/03/20 0728 11/04/20 0538 11/04/20 2334  NA 142   < > 147* 145 148* 147* 140  K 4.7   < > 3.9 4.2 4.6 4.5 4.3  CL 108   < > 111 109 111 110 106  CO2 24   < > 26 28 29 26 25   GLUCOSE 230*   < > 126* 126* 125* 93 104*  BUN 112*   < > 118* 96* 118* 103* 98*  CREATININE 2.84*   < > 2.19* 2.31* 2.52* 2.31* 2.35*  CALCIUM 7.7*   < > 8.2* 8.4* 8.6* 8.1* 7.8*  MG 2.5*  --   --   --   --   --   --    < > = values in this interval not displayed.   GFR: Estimated Creatinine Clearance: 31.7 mL/min (A) (by C-G formula based on SCr of 2.35 mg/dL (H)). Liver Function Tests: Recent Labs  Lab 10/30/20 0527 10/31/20 0429 11/02/20 0640  AST 34 38 40  ALT 81* 89* 125*  ALKPHOS 34* 31* 35*  BILITOT 0.7 0.7 0.8  PROT 5.8* 5.8* 6.2*  ALBUMIN 2.1* 2.2* 2.6*   No results for input(s): LIPASE, AMYLASE in the last 168 hours. Recent Labs  Lab 10/30/20 0527  AMMONIA 38*   Coagulation Profile: No results for input(s): INR, PROTIME in the last 168 hours. Cardiac Enzymes: No results for input(s): CKTOTAL, CKMB, CKMBINDEX, TROPONINI in the last 168 hours. BNP (last 3 results) No results for input(s): PROBNP in the last 8760 hours. HbA1C: No results for input(s): HGBA1C in the last 72 hours. CBG: Recent Labs  Lab 11/05/20 0008 11/05/20 0420 11/05/20 0729 11/05/20 1117 11/05/20 1711  GLUCAP 87 101* 88 100* 139*   Lipid Profile: No results for input(s): CHOL, HDL, LDLCALC, TRIG, CHOLHDL, LDLDIRECT in the last 72 hours. Thyroid Function Tests: No results for input(s): TSH, T4TOTAL, FREET4, T3FREE, THYROIDAB in the last 72 hours. Anemia Panel: No results for input(s):  VITAMINB12, FOLATE, FERRITIN, TIBC, IRON, RETICCTPCT in the last 72 hours. Sepsis Labs: No results for input(s): PROCALCITON, LATICACIDVEN in the last 168 hours.  Recent Results (from the past 240 hour(s))  Culture, blood (routine x 2)     Status: None   Collection Time: 10/27/20  8:55 AM   Specimen: BLOOD LEFT HAND  Result Value Ref Range Status   Specimen Description   Final    BLOOD LEFT HAND BOTTLES DRAWN AEROBIC AND ANAEROBIC  Special Requests Blood Culture adequate volume  Final   Culture   Final    NO GROWTH 5 DAYS Performed at [facility], [address] [place], [place] [address]    Report Status 11/01/2020 FINAL  Final  Culture, blood (routine x 2)     Status: None   Collection Time: 10/27/20  8:58 AM   Specimen: BLOOD LEFT ARM  Result Value Ref Range Status   Specimen Description BLOOD LEFT ARM BOTTLES DRAWN AEROBIC AND ANAEROBIC  Final   Special Requests Blood Culture adequate volume  Final   Culture   Final    NO GROWTH 5 DAYS Performed at [facility], [address], [place], [place] [address]    Report Status 11/01/2020 FINAL  Final         Radiology Studies: No results found.      Scheduled Meds: . (feeding supplement) PROSource Plus  30 mL Oral BID BM  . vitamin C  500 mg Oral Daily  . baricitinib  2 mg Oral Daily  . carvedilol  6.25 mg Per Tube BID WC  . Chlorhexidine Gluconate Cloth  6 each Topical Daily  . diltiazem  60 mg Oral Q8H  . feeding supplement  237 mL Oral TID BM  . pantoprazole (PROTONIX) IV  40 mg Intravenous Q12H  . zinc sulfate  220 mg Oral Daily   Continuous Infusions: . dextrose Stopped (11/02/20 0444)     LOS: 15 days    Time spent: 35 minutes    Kathie Dike, MD [facility]  If 7PM-7AM, please contact night-coverage [email or web address] 11/05/2020, 8:53 PM

## 2020-11-05 NOTE — Progress Notes (Signed)
MD notified of patients Hgb of 6.7. Orders are in place already to transfuse PRBCs. Will continue to monitor patient as needed.

## 2020-11-05 NOTE — TOC Progression Note (Signed)
Transition of Care [facility]) - Progression Note    Patient Details  Name: Darren Burke MRN: [id number] Date of Birth: 1945/12/03  Transition of Care [facility]) CM/SW Contact  Ihor Gully, LCSW Phone Number: 11/05/2020, 4:22 PM  Clinical Narrative:    Daughter, [name], is considering SNF. Wants to speak with her brother and sister more to see if they can work out a schedule where patient will have 24/7 care.  TOC contacted [facility] to see if they can provide PCS to patient.    Expected Discharge Plan: [place] Barriers to Discharge: Continued Medical Work up  Expected Discharge Plan and Services Expected Discharge Plan: [place] In-house Referral: Clinical Social Work Discharge Planning Services: CM Consult Post Acute Care Choice: [place] arrangements for the past 2 months: Apartment                 DME Arranged: Hospital bed,3-N-1,Other see comment (prescription for condom caths) DME Agency: [place]       HH Arranged: RN,PT [place] Agency: [place] Date [facility] Agency Contacted: 11/05/20 Time [place]: [place] Representative spoke with at [place]: [place] ([place]) Interventions    Readmission Risk Interventions Readmission Risk Prevention Plan 10/24/2020  Transportation Screening Complete  HRI or [place] Complete  Social Work Consult for [place] Planning/Counseling [place] Screening Not Applicable  Medication Review Press photographer) Complete  Some recent data might be hidden

## 2020-11-05 NOTE — Progress Notes (Signed)
Subjective: Pleasantly confused. Difficult historian. Asking where he is. Repeatedly stating he is cold. Nasal canula in place on 2L. Denies abdominal pain, nausea, vomiting, or overt GI bleeding. Asking for coffee and cola. States he hasn't had a BM. Denies shortness of breath. No overt GI bleeding per nursing staff. Sounds congested. Very wet cough while I was in the room.   Objective: Vital signs in last 24 hours: Temp:  [97.7 F (36.5 C)-98.3 F (36.8 C)] 97.8 F (36.6 C) (01/11 0611) Pulse Rate:  [55-72] 61 (01/11 0611) Resp:  [18-20] 18 (01/11 0611) BP: (97-123)/(56-68) 117/68 (01/11 0611) SpO2:  [95 %-100 %] 97 % (01/11 0611) Weight:  [98 kg-99.7 kg] 98 kg (01/11 0611) Last BM Date: 11/01/20 General: Alert. Pleasantly confused. Oriented to self. Frail, pale, and weak appearing.   Head:  Normocephalic and atraumatic. Eyes:  No icterus, sclera clear. Conjuctiva pink.   Lungs: Few crackles in the lung bases. Normal respiratory effort. Nasal canula in place.  Abdomen:  Bowel sounds present, soft, non-tender, non-distended. No HSM or hernias noted. No rebound or guarding. No masses appreciated  Msk:  Symmetrical without gross deformities.  Extremities:  Trace pedal edema.  Neurologic:  Alert. Oriented to self.  Psych:  Flat affect.   Intake/Output from previous day: 01/10 0701 - 01/11 0700 In: 2117.5 [P.O.:1200; I.V.:523.5; Blood:394] Out: 1500 [Urine:1500] Intake/Output this shift: No intake/output data recorded.  Lab Results: Recent Labs    11/04/20 0538 11/04/20 1536 11/04/20 2334  WBC 9.1 8.7 7.3  HGB 7.1* 7.0* 6.7*  HCT 24.8* 24.8* 23.5*  PLT 252 224 238   BMET Recent Labs    11/03/20 0728 11/04/20 0538 11/04/20 2334  NA 148* 147* 140  K 4.6 4.5 4.3  CL 111 110 106  CO2 29 26 25   GLUCOSE 125* 93 104*  BUN 118* 103* 98*  CREATININE 2.52* 2.31* 2.35*  CALCIUM 8.6* 8.1* 7.8*    Assessment: 75 year old male admitted with acute respiratory failure  in setting of COVID-19 pneumonia, found to have heme positive stool and progressively worsening anemia.  Hemoglobin 12.2 on admission, declined on 1/4 and stayed in this range for a few days.  Hemoglobin 7.1 on 1/10 and further decline to 6.7 on the evening of 1/10.  He has received 1 unit PRBCs with posttransfusion hemoglobin 7.6 this morning.  No overt GI bleeding per nursing staff. BUN has been markedly elevated, but this is also in the setting of CKD. Patient is limited historian with underlying cognitive deficits at baseline and early signs of dementia per review of notes in epic.  Notably, he was hypotensive on 1/9 and received two 500 mL fluid boluses on 1/9 and 1/10 and IV fluids at 100 mL/h were also resumed on 1/9.  This may account for the decline in hemoglobin secondary to hemodilution.  Ferritin was elevated at 419, iron elevated at 268, saturation 86%. He has been maintained on IV PPI BID and we have been holding off on inpatient endoscopic evaluation in the setting of COVID-19 pneumonia.   Anemia is multifactorial in the setting of acute illness, CKD, and cannot rule out occult GI bleed.  In light of declining hemoglobin requiring blood transfusion, we will need to consider inpatient procedures. Regarding his pneumonia, he appears to be improving slowly.  Currently on 2 L nasal cannula.  Continues to have a few crackles in his lung bases and wet/congested cough.   Discussed with Dr. Jenetta Downer, possible EGD tomorrow. Will make NPO  at midnight and plan to reassess in the am.   Plan: Continue to monitor H/H. Monitor for overt GI bleeding. Continue PPI IV twice daily. Possible EGD with propofol tomorrow.  NPO at midnight.     LOS: 15 days    11/05/2020, 9:19 AM   Aliene Altes, PA-C [facility]

## 2020-11-05 NOTE — TOC Progression Note (Signed)
Transition of Care [facility]) - Progression Note    Patient Details  Name: Darren Burke MRN: [id number] Date of Birth: 1946/04/07  Transition of Care [facility]) CM/SW Contact  Ihor Gully, LCSW Phone Number: 11/05/2020, 3:59 PM  Clinical Narrative:    Daughter [name] is agreeable to ongoing palliative follow up. Palliative providers discussed. Referral made to [facility].   Expected Discharge Plan: [place] Barriers to Discharge: Continued Medical Work up  Expected Discharge Plan and Services Expected Discharge Plan: [place] In-house Referral: Clinical Social Work Discharge Planning Services: CM Consult Post Acute Care Choice: [place] arrangements for the past 2 months: Apartment                 DME Arranged: Hospital bed,3-N-1,Other see comment (prescription for condom caths) DME Agency: [place]       HH Arranged: RN,PT [place] Agency: [place] Date [facility] Agency Contacted: 11/05/20 Time [place]: [place] Representative spoke with at [place]: [place] ([place]) Interventions    Readmission Risk Interventions Readmission Risk Prevention Plan 10/24/2020  Transportation Screening Complete  HRI or [place] Complete  Social Work Consult for [place] Planning/Counseling [place] Screening Not Applicable  Medication Review Press photographer) Complete  Some recent data might be hidden

## 2020-11-06 DIAGNOSIS — U071 COVID-19: Secondary | ICD-10-CM | POA: Diagnosis not present

## 2020-11-06 DIAGNOSIS — J1282 Pneumonia due to coronavirus disease 2019: Secondary | ICD-10-CM | POA: Diagnosis not present

## 2020-11-06 DIAGNOSIS — J9621 Acute and chronic respiratory failure with hypoxia: Secondary | ICD-10-CM | POA: Diagnosis not present

## 2020-11-06 DIAGNOSIS — N184 Chronic kidney disease, stage 4 (severe): Secondary | ICD-10-CM | POA: Diagnosis not present

## 2020-11-06 LAB — COMPREHENSIVE METABOLIC PANEL
ALT: 87 U/L — ABNORMAL HIGH (ref 0–44)
AST: 30 U/L (ref 15–41)
Albumin: 2.4 g/dL — ABNORMAL LOW (ref 3.5–5.0)
Alkaline Phosphatase: 31 U/L — ABNORMAL LOW (ref 38–126)
Anion gap: 7 (ref 5–15)
BUN: 74 mg/dL — ABNORMAL HIGH (ref 8–23)
CO2: 26 mmol/L (ref 22–32)
Calcium: 7.8 mg/dL — ABNORMAL LOW (ref 8.9–10.3)
Chloride: 108 mmol/L (ref 98–111)
Creatinine, Ser: 2.21 mg/dL — ABNORMAL HIGH (ref 0.61–1.24)
GFR, Estimated: 31 mL/min — ABNORMAL LOW (ref >60)
Glucose, Bld: 99 mg/dL (ref 70–99)
Potassium: 4.1 mmol/L (ref 3.5–5.1)
Sodium: 141 mmol/L (ref 135–145)
Total Bilirubin: 0.8 mg/dL (ref 0.3–1.2)
Total Protein: 5.3 g/dL — ABNORMAL LOW (ref 6.5–8.1)

## 2020-11-06 LAB — RETICULOCYTES
Immature Retic Fract: 23.4 % — ABNORMAL HIGH (ref 2.3–15.9)
RBC.: 2.57 MIL/uL — ABNORMAL LOW (ref 4.22–5.81)
Retic Count, Absolute: 136 10*3/uL (ref 19.0–186.0)
Retic Ct Pct: 5.3 % — ABNORMAL HIGH (ref 0.4–3.1)

## 2020-11-06 LAB — TYPE AND SCREEN
ABO/RH(D): A POS
Antibody Screen: NEGATIVE
Unit division: 0

## 2020-11-06 LAB — GLUCOSE, CAPILLARY
Glucose-Capillary: 218 mg/dL — ABNORMAL HIGH (ref 70–99)
Glucose-Capillary: 95 mg/dL (ref 70–99)
Glucose-Capillary: 97 mg/dL (ref 70–99)
Glucose-Capillary: 98 mg/dL (ref 70–99)
Glucose-Capillary: 99 mg/dL (ref 70–99)

## 2020-11-06 LAB — BPAM RBC
Blood Product Expiration Date: 202201232359
ISSUE DATE / TIME: 202201110244
Unit Type and Rh: 6200

## 2020-11-06 LAB — CBC
HCT: 25.7 % — ABNORMAL LOW (ref 39.0–52.0)
Hemoglobin: 7.8 g/dL — ABNORMAL LOW (ref 13.0–17.0)
MCH: 30.6 pg (ref 26.0–34.0)
MCHC: 30.4 g/dL (ref 30.0–36.0)
MCV: 100.8 fL — ABNORMAL HIGH (ref 80.0–100.0)
Platelets: 210 10*3/uL (ref 150–400)
RBC: 2.55 MIL/uL — ABNORMAL LOW (ref 4.22–5.81)
RDW: 19.6 % — ABNORMAL HIGH (ref 11.5–15.5)
WBC: 8.3 10*3/uL (ref 4.0–10.5)
nRBC: 0.2 % (ref 0.0–0.2)

## 2020-11-06 LAB — LACTATE DEHYDROGENASE: LDH: 223 U/L — ABNORMAL HIGH (ref 98–192)

## 2020-11-06 NOTE — Progress Notes (Signed)
PROGRESS NOTE  Darren Burke:[id number] DOB: Sep 17, 1946 DOA: 10/21/2020 PCP: Rosita Fire, MD  Brief History:  Darren Burke a 75 y.o.malewith medical history significantfor longtimetobacco use, COPD(emphysema),GERD,stage IV CKD, chronic constipation, hypertension, paroxysmal atrial flutter who presented to the emergency department from PCP office with shortness of breath and hypoxia. He was noted to have a pulse ox of 80 to 81% on room air. Patient was admitted with acute hypoxemic respiratory failure secondary to Covid pneumonia and is unvaccinated.  He was treated with remdesivir, steroids and baricitinib.  Overall hypoxemia significantly improved. He developed significant encephalopathy, which may be related to COVID versus hospital delirium.  Overall mental status appears to be improving at this time.  CT head with no acute findings.  He was also noted to be in atrial fibrillation and is currently rate controlled.  Hospital course further complicated by development of significant anemia.  He has elevated BUN and hemoglobin has drifted down since admission.  He was transfused 1 unit of PRBC on 1/10.  Concern for upper GI bleed.  GI following and considering EGD if any signs of active bleed  Assessment/Plan: Acute respiratory failure with hypoxia secondary to Covid pneumonia -patient is unvaccinated. He has underlying chronic lung disease and other co-morbidities. He remains HighRisk for adverse outcomeandseverelungparenchymal injury. He and his family initially refused the use of remdesivir;thenon 10/22/2020 accepted to be treated along with the use of steroids. Patient with good response to the initiation of barcitinib. Currently, he has completed his steroids, remdesivir and baricitinib course.  Acute encephalopathy-multifactorial.   -Family reports that he may have some baseline cognitive deficits and early signs of dementia at baseline.  CT head found  to be negative.  Suspect that worsening mental status was related to acute infectious process/metabolic issues.  Limit sedating medications.  Overall mental status appears to be improving  Bacteremia -contaminant -one set blood culture positive for micrococcus likely contaminant. Repeat blood cultures negative. Rocephin was discontinued  Euthyroid sick syndrome Low TSH.  Free T4 is normal.  Repeat labs in 3 to 4 weeks as an outpatient  Chronic stage IV kidney disease  -Baseline creatinine approximately 2.2-2.5.   -His creatinine is stable overall and will continue following trend. Continue minimizing nephrotoxic agents and avoiding hypotension.  Patient has markedly elevated BUN.  His FOBT is also positive.  Concern for GI bleeding.  Blood Loss Anemia   -Noted to have FOBT positive and dark stools.  BUN is markedly elevated.  Aspirin and heparin have been discontinued.  GI following and plans on possible EGD if any signs of active bleed -Hgb trending down from 12>>7 during this admission -continue PPI -Doubt hemolysis with normal bilirubin -multifactorial including CKDchronic disease, numerous blood draws, dilution -one unit PRBC transfused on 11/04/20 -discussed with Dr. Laural Burke 11/06/20  COPD with acute exacerbation  -secondary to Covid infection treated with IV steroids and bronchodilators as mentioned above.  Overall respiratory status has stabilized and steroids have been discontinued  Paroxysmal atrial flutter -RVR improved -He is on carvedilol and diltiazem for rate control.  Heart rate is currently stable Chadsvasc score of 2 for hypertension and age.   -Not a candidate for anticoagulation at this time due to concerns for GI bleeding.  Chronic constipation  - laxative therapy ordered. Follow response and further improve bowel management as required.  Transaminitis  -reactive from current Covid infection. Currently stable.  Continue to  monitor.  Hypernatremia/hyperkalemia:  -  Improved after receiving hypotonic fluids and Lokelma.  Continue to monitor  Goals of care.   -Detailed discussion with the patient's daughter regarding his expected prognosis.  Recommended DNR status and she is in agreement.  She wishes to continue with current treatments, but if he were to develop cardiac/respiratory arrest, she would not want heroic measures       Status is: Inpatient  Remains inpatient appropriate because:IV treatments appropriate due to intensity of illness or inability to take PO   Dispo: The patient is from: Home              Anticipated d/c is to: Home              Anticipated d/c date is: 1 day              Patient currently is not medically stable to d/c.        Family Communication:  no Family at bedside  Consultants:  Palliative; GI  Code Status: DNR  DVT Prophylaxis:  SCDs        Subjective: Patient denies fevers, chills, headache, chest pain, dyspnea, nausea, vomiting, diarrhea, abdominal pain, dysuria,    Objective: Vitals:   11/05/20 1754 11/05/20 2021 11/06/20 0539 11/06/20 1337  BP: 101/69 130/69 116/71 108/68  Pulse: 74 94  (!) 41  Resp:  18 18 20   Temp:  98.4 F (36.9 C) 97.8 F (36.6 C) 98 F (36.7 C)  TempSrc:  Oral Oral Oral  SpO2:  99% 100% 97%  Weight:      Height:        Intake/Output Summary (Last 24 hours) at 11/06/2020 1734 Last data filed at 11/06/2020 0762 Gross per 24 hour  Intake 0 ml  Output 350 ml  Net -350 ml   Weight change:  Exam:   General:  Pt is alert, follows commands appropriately, not in acute distress  HEENT: No icterus, No thrush, No neck mass, [place]/AT  Cardiovascular: RRR, S1/S2, no rubs, no gallops  Respiratory: bibasilar rales. No wheeze  Abdomen: Soft/+BS, non tender, non distended, no guarding  Extremities: No edema, No lymphangitis, No petechiae, No rashes, no synovitis   Data Reviewed: I have personally reviewed following  labs and imaging studies Basic Metabolic Panel: Recent Labs  Lab 11/02/20 0640 11/03/20 0728 11/04/20 0538 11/04/20 2334 11/06/20 0646  NA 145 148* 147* 140 141  K 4.2 4.6 4.5 4.3 4.1  CL 109 111 110 106 108  CO2 28 29 26 25 26   GLUCOSE 126* 125* 93 104* 99  BUN 96* 118* 103* 98* 74*  CREATININE 2.31* 2.52* 2.31* 2.35* 2.21*  CALCIUM 8.4* 8.6* 8.1* 7.8* 7.8*   Liver Function Tests: Recent Labs  Lab 10/31/20 0429 11/02/20 0640 11/06/20 0646  AST 38 40 30  ALT 89* 125* 87*  ALKPHOS 31* 35* 31*  BILITOT 0.7 0.8 0.8  PROT 5.8* 6.2* 5.3*  ALBUMIN 2.2* 2.6* 2.4*   No results for input(s): LIPASE, AMYLASE in the last 168 hours. No results for input(s): AMMONIA in the last 168 hours. Coagulation Profile: No results for input(s): INR, PROTIME in the last 168 hours. CBC: Recent Labs  Lab 11/04/20 0538 11/04/20 1536 11/04/20 2334 11/05/20 0930 11/06/20 0646  WBC 9.1 8.7 7.3 8.4 8.3  HGB 7.1* 7.0* 6.7* 7.6* 7.8*  HCT 24.8* 24.8* 23.5* 25.4* 25.7*  MCV 105.1* 107.4* 105.9* 100.8* 100.8*  PLT 252 224 238 229 210   Cardiac Enzymes: No results for input(s): CKTOTAL, CKMB, CKMBINDEX,  TROPONINI in the last 168 hours. BNP: Invalid input(s): POCBNP CBG: Recent Labs  Lab 11/05/20 1711 11/05/20 2043 11/06/20 0735 11/06/20 1101 11/06/20 1704  GLUCAP 139* 107* 97 99 98   HbA1C: No results for input(s): HGBA1C in the last 72 hours. Urine analysis:    Component Value Date/Time   COLORURINE YELLOW 10/25/2020 1214   APPEARANCEUR HAZY (A) 10/25/2020 1214   LABSPEC 1.015 10/25/2020 1214   PHURINE 5.0 10/25/2020 1214   GLUCOSEU NEGATIVE 10/25/2020 1214   HGBUR MODERATE (A) 10/25/2020 1214   BILIRUBINUR NEGATIVE 10/25/2020 1214   KETONESUR NEGATIVE 10/25/2020 1214   PROTEINUR 30 (A) 10/25/2020 1214   UROBILINOGEN 0.2 11/10/2014 0400   NITRITE NEGATIVE 10/25/2020 1214   LEUKOCYTESUR NEGATIVE 10/25/2020 1214   Sepsis Labs: @LABRCNTIP (procalcitonin:4,lacticidven:4) )No  results found for this or any previous visit (from the past 240 hour(s)).   Scheduled Meds: . (feeding supplement) PROSource Plus  30 mL Oral BID BM  . vitamin C  500 mg Oral Daily  . carvedilol  6.25 mg Per Tube BID WC  . Chlorhexidine Gluconate Cloth  6 each Topical Daily  . diltiazem  60 mg Oral Q8H  . feeding supplement  237 mL Oral TID BM  . pantoprazole (PROTONIX) IV  40 mg Intravenous Q12H  . zinc sulfate  220 mg Oral Daily   Continuous Infusions: . dextrose Stopped (11/02/20 0444)    Procedures/Studies: CT HEAD WO CONTRAST  Result Date: 10/29/2020 CLINICAL DATA:  Mental status change, unknown cause. Altered mental status, COVID positive. EXAM: CT HEAD WITHOUT CONTRAST TECHNIQUE: Contiguous axial images were obtained from the base of the skull through the vertex without intravenous contrast. COMPARISON:  No pertinent prior exams available for comparison. FINDINGS: Brain: Mildly motion degraded exam. Cerebral volume is normal for age. There is no acute intracranial hemorrhage. No demarcated cortical infarct. No extra-axial fluid collection. No evidence of intracranial mass. No midline shift. Vascular: No hyperdense vessel. Atherosclerotic calcifications Skull: Normal. Negative for fracture or focal lesion. Sinuses/Orbits: Visualized orbits show no acute finding. Scattered secretions within the left ethmoid air cells. Moderate mucosal thickening within the right maxillary sinus with associated chronic reactive osteitis. There is full-thickness erosion of the anterior right maxillary sinus wall inferiorly (for instance as seen on series 3, image 3)(series 5, image 23). Other: Partially visualized nasoenteric tube. IMPRESSION: Mildly motion degraded exam. No evidence of acute intracranial abnormality. Moderate mucosal thickening within the right maxillary sinus with associated chronic reactive osteitis. There is focal full-thickness erosion of the anterior right maxillary sinus wall inferiorly.  Mild left ethmoid sinusitis. Electronically Signed   By: Kellie Simmering DO   On: 10/29/2020 11:43   DG CHEST PORT 1 VIEW  Result Date: 10/28/2020 CLINICAL DATA:  Post NG tube placement. EXAM: PORTABLE CHEST 1 VIEW COMPARISON:  10/25/2020 and prior. FINDINGS: Diffuse interstitial prominence. No pneumothorax or pleural effusion. Decreased conspicuity of patchy right basilar opacities. Stable cardiomediastinal silhouette. Non weighted enteric tube tip and side hole overlie the gastric body. IMPRESSION: Enteric tube tip and side hole overlie the gastric body. Emphysematous changes. Right basilar opacities are less conspicuous than prior exam. Electronically Signed   By: Primitivo Gauze M.D.   On: 10/28/2020 09:38   DG CHEST PORT 1 VIEW  Result Date: 10/25/2020 CLINICAL DATA:  Worsening shortness of breath today. Positive COVID test on 10/21/2020. Former smoker with COPD. EXAM: PORTABLE CHEST 1 VIEW COMPARISON:  10/22/2020 and older studies. FINDINGS: Cardiac silhouette is normal in size. No mediastinal or  hilar masses. Bilateral interstitial thickening noted most evident at the bases, right greater than left, similar to the prior exam. No lung consolidation to suggest pneumonia. No pleural effusion or pneumothorax. Skeletal structures are grossly intact. IMPRESSION: 1. No convincing acute cardiopulmonary disease. 2. Bilateral interstitial thickening, greatest at the right lung base, consistent with fibrosis. Electronically Signed   By: Lajean Manes M.D.   On: 10/25/2020 14:01   Portable chest 1 View  Result Date: 10/22/2020 CLINICAL DATA:  Shortness of breath. EXAM: PORTABLE CHEST 1 VIEW COMPARISON:  10/21/2020.  11/24/2017. FINDINGS: Mediastinum hilar structures normal. Cardiomegaly with pulmonary venous congestion bilateral interstitial prominence. Small bilateral pleural effusions. Findings suggest CHF. Pneumonitis cannot be excluded. Associated bibasilar atelectasis and or infiltrates. Bibasilar  pneumonia cannot be excluded. IMPRESSION: 1. Cardiomegaly with pulmonary venous congestion bilateral interstitial prominence. Small bilateral pleural effusions. Findings suggest CHF. 2. Associated bibasilar atelectasis and or infiltrates. Bibasilar pneumonia cannot be excluded. Electronically Signed   By: Marcello Moores  Register   On: 10/22/2020 05:11   DG Chest Portable 1 View  Result Date: 10/21/2020 CLINICAL DATA:  Hypoxia. EXAM: PORTABLE CHEST 1 VIEW COMPARISON:  November 24, 2017. FINDINGS: The heart size and mediastinal contours are within normal limits. No pneumothorax or pleural effusion is noted. Left lung is clear. Right midlung and basilar opacities are noted concerning for pneumonia. The visualized skeletal structures are unremarkable. IMPRESSION: Right midlung and basilar opacities are noted concerning for pneumonia. Electronically Signed   By: Marijo Conception M.D.   On: 10/21/2020 12:59    Orson Eva, DO  [facility]  If 7PM-7AM, please contact night-coverage [email or web address] Password [facility] 11/06/2020, 5:34 PM   LOS: 16 days

## 2020-11-06 NOTE — Progress Notes (Signed)
Physical Therapy Treatment Patient Details Name: Darren Burke MRN: [id number] DOB: [birth year] Today's Date: 11/06/2020    History of Present Illness Darren Burke is a 75 y.o. male with medical history significant for longtime tobacco use, COPD (emphysema), GERD, stage IV CKD, chronic constipation, hypertension, paroxysmal atrial flutter who presented to the emergency department from PCP office with shortness of breath and hypoxia.  He was noted to have a pulse ox of 80 to 81% on room air.  He has been placed on 4 L nasal cannula with improvement of oxygen saturation to 87%.  He does not wear home oxygen.  He has had progressive shortness of breath for the past 2 days.  He feels unwell overall.  He says that he ran out of his albuterol inhaler 4 days ago.  His symptoms have been persistent and progressively getting worse.  His shortness of breath is much worse when ambulating or moving.  He denies chest pain.  He reports a nonproductive cough.  He lives home alone.  He is unvaccinated for COVID-19.    PT Comments    Patient presents slightly confused requiring repeated verbal/tactile cueing to follow directions.  Patient had difficulty completing sit to stands due to BLE weakness requiring multiple attempts, once on feet unable to advance BLE without Mod/max tactile assistance to slide feet when taking side steps, limited secondary to fatigue and poor standing balance.  Patient tolerated sitting up in chair for a few minutes, but put back to bed due to risk of falling out of chair with requent leaning forward and to right side - RN notified.  Patient will benefit from continued physical therapy in hospital and recommended venue below to increase strength, balance, endurance for safe ADLs and gait.   Follow Up Recommendations  SNF     Equipment Recommendations       Recommendations for Other Services       Precautions / Restrictions Precautions Precautions: Fall Restrictions Weight  Bearing Restrictions: No    Mobility  Bed Mobility Overal bed mobility: Needs Assistance Bed Mobility: Supine to Sit;Sit to Supine     Supine to sit: Mod assist;Max assist Sit to supine: Mod assist   General bed mobility comments: increased time, labored movement  Transfers Overall transfer level: Needs assistance Equipment used: Rolling walker (2 wheeled) Transfers: Sit to/from [facility] Sit to Stand: Mod assist;Max assist Stand pivot transfers: Mod assist;Max assist       General transfer comment: very unsteady on feet, requiring multiple attempts before able to stand  Ambulation/Gait Ambulation/Gait assistance: Mod assist;Max assist Gait Distance (Feet): 4 Feet Assistive device: Rolling walker (2 wheeled) Gait Pattern/deviations: Decreased step length - right;Decreased step length - left;Decreased stride length;Shuffle Gait velocity: slow   General Gait Details: limited to 4-5 slow labored unsteady shuffling steps at bedside due to weakness, required tactile assistance to advance BLE   Stairs             Wheelchair Mobility    Modified Rankin (Stroke Patients Only)       Balance Overall balance assessment: Needs assistance Sitting-balance support: Feet supported;No upper extremity supported Sitting balance-Leahy Scale: Poor Sitting balance - Comments: fair/poor seated at EOB, frequent leaning forward due to weakness   Standing balance support: During functional activity;Bilateral upper extremity supported Standing balance-Leahy Scale: Poor Standing balance comment: using RW  Cognition Arousal/Alertness: Awake/alert Behavior During Therapy: WFL for tasks assessed/performed Overall Cognitive Status: No family/caregiver present to determine baseline cognitive functioning                                        Exercises      General Comments        Pertinent Vitals/Pain  Pain Assessment: No/denies pain    Home Living                      Prior Function            PT Goals (current goals can now be found in the care plan section) Acute Rehab PT Goals Patient Stated Goal: return home after rehab PT Goal Formulation: With patient Time For Goal Achievement: 11/15/20 Potential to Achieve Goals: Good Progress towards PT goals: Progressing toward goals    Frequency    Min 3X/week      PT Plan Current plan remains appropriate    Co-evaluation              AM-PAC PT "6 Clicks" Mobility   Outcome Measure  Help needed turning from your back to your side while in a flat bed without using bedrails?: A Lot Help needed moving from lying on your back to sitting on the side of a flat bed without using bedrails?: A Lot Help needed moving to and from a bed to a chair (including a wheelchair)?: A Lot Help needed standing up from a chair using your arms (e.g., wheelchair or bedside chair)?: A Lot Help needed to walk in hospital room?: A Lot Help needed climbing 3-5 steps with a railing? : Total 6 Click Score: 11    End of Session Equipment Utilized During Treatment: Oxygen Activity Tolerance: Patient tolerated treatment well;Patient limited by fatigue Patient left: in bed;with call bell/phone within reach;with bed alarm set Nurse Communication: Mobility status PT Visit Diagnosis: Unsteadiness on feet (R26.81);Other abnormalities of gait and mobility (R26.89);Muscle weakness (generalized) (M62.81)     Time: 5621-3086 PT Time Calculation (min) (ACUTE ONLY): 30 min  Charges:  $Therapeutic Activity: 23-37 mins                     4:51 PM, 11/06/20 Lonell Grandchild, MPT Physical Therapist with [facility] 336 [phone] office [phone] mobile phone

## 2020-11-06 NOTE — Progress Notes (Signed)
Pt's BP 108/68 with HR in 40-60s.  Notified Dr. Carles Collet and he stated the 1700 dose of Coreg should be held.  Dose was not given.  See MAR.

## 2020-11-06 NOTE — NC FL2 (Signed)
[place] LEVEL OF CARE SCREENING TOOL     IDENTIFICATION  Patient Name: Darren Burke Birthdate: 1946-02-07 Sex: male Admission Date (Current Location): 10/21/2020  [place] and [place] Number:  [facility] [phone] [place] and Address:  [facility] [address] [place]      Provider Number: [id number]  Attending Physician Name and Address:  Orson Eva, MD  Relative Name and Phone Number:  Awilda Metro (Daughter)   [phone]    Current Level of Care: SNF Recommended Level of Care: [place] Prior Approval Number:    Date Approved/Denied:   PASRR Number: [id number]  Discharge Plan: SNF    Current Diagnoses: Patient Active Problem List   Diagnosis Date Noted  . Melena   . Gastrointestinal hemorrhage   . Pneumonia due to COVID-19 virus 10/21/2020  . Transaminitis 10/21/2020  . CKD (chronic kidney disease), stage IV ([place]) 11/25/2017  . HTN (hypertension) 12/10/2016  . Paroxysmal atrial flutter ([place]) 12/10/2016  . Anemia 12/10/2016  . Pneumonia 11/10/2014  . Hypoxia   . Acute and chronic respiratory failure with hypoxia ([place])   . COPD with acute exacerbation ([place]) 10/05/2013  . PNA (pneumonia) 10/05/2013  . Acute respiratory failure with hypoxia ([place]) 10/05/2013  . Sinus tachycardia 10/05/2013  . SOB (shortness of breath) 10/05/2013  . CAP (community acquired pneumonia) 10/05/2013  . Hyperglycemia, drug-induced 10/05/2013  . Pulmonary emphysema ([facility])     Orientation RESPIRATION BLADDER Height & Weight     Self  Normal Incontinent Weight: 216 lb 0.8 oz (98 kg) Height:  5\' 7"  (170.2 cm)  BEHAVIORAL SYMPTOMS/MOOD NEUROLOGICAL BOWEL NUTRITION STATUS      Incontinent Diet (see discharge summary)  AMBULATORY STATUS COMMUNICATION OF NEEDS Skin   Extensive Assist Verbally Normal                       Personal Care Assistance Level of Assistance  Bathing,Feeding,Dressing Bathing Assistance:  Limited assistance Feeding assistance: Independent Dressing Assistance: Limited assistance     Functional Limitations Info  Sight,Hearing,Speech Sight Info: Adequate Hearing Info: Adequate Speech Info: Adequate    SPECIAL CARE FACTORS FREQUENCY  PT (By licensed PT)     PT Frequency: 5x/week              Contractures Contractures Info: Not present    Additional Factors Info  Code Status,Allergies,Isolation Precautions Code Status Info: DRN Allergies Info: Penicillins     Isolation Precautions Info: Patient COVID+ 10/21/20     Current Medications (11/06/2020):  This is the current hospital active medication list Current Facility-Administered Medications  Medication Dose Route Frequency Provider Last Rate Last Admin  . (feeding supplement) PROSource Plus liquid 30 mL  30 mL Oral BID BM Kathie Dike, MD   30 mL at 11/05/20 1251  . acetaminophen (TYLENOL) tablet 650 mg  650 mg Oral Q6H PRN Reubin Milan, MD      . albuterol (VENTOLIN HFA) 108 (90 Base) MCG/ACT inhaler 2 puff  2 puff Inhalation Q6H PRN Reubin Milan, MD   2 puff at 10/24/20 2355  . ascorbic acid (VITAMIN C) tablet 500 mg  500 mg Oral Daily Reubin Milan, MD   500 mg at 11/06/20 1937  . bisacodyl (DULCOLAX) suppository 10 mg  10 mg Rectal Daily PRN Barton Dubois, MD   10 mg at 10/26/20 1413  . carvedilol (COREG) tablet 6.25 mg  6.25 mg Per Tube BID WC Kathie Dike, MD  6.25 mg at 11/06/20 0907  . Chlorhexidine Gluconate Cloth 2 % PADS 6 each  6 each Topical Daily Barton Dubois, MD   6 each at 11/04/20 [phone]  . chlorpheniramine-HYDROcodone (TUSSIONEX) 10-8 MG/5ML suspension 5 mL  5 mL Oral Q12H PRN Reubin Milan, MD      . dextrose 5 % solution   Intravenous Continuous Kathie Dike, MD   Stopped at 11/02/20 0444  . diltiazem (CARDIZEM) tablet 60 mg  60 mg Oral Q8H Kathie Dike, MD   60 mg at 11/06/20 0540  . feeding supplement (ENSURE ENLIVE / ENSURE PLUS) liquid 237 mL  237  mL Oral TID BM Kathie Dike, MD   237 mL at 11/05/20 2035  . guaiFENesin-dextromethorphan (ROBITUSSIN DM) 100-10 MG/5ML syrup 10 mL  10 mL Oral Q4H PRN Reubin Milan, MD   10 mL at 10/22/20 2255  . ondansetron (ZOFRAN) tablet 4 mg  4 mg Oral Q6H PRN Reubin Milan, MD       Or  . ondansetron [facility]) injection 4 mg  4 mg Intravenous Q6H PRN Reubin Milan, MD      . pantoprazole (PROTONIX) injection 40 mg  40 mg Intravenous Q12H Kathie Dike, MD   40 mg at 11/06/20 0908  . zinc sulfate capsule 220 mg  220 mg Oral Daily Reubin Milan, MD   220 mg at 11/06/20 0908     Discharge Medications: Please see discharge summary for a list of discharge medications.  Relevant Imaging Results:  Relevant Lab Results:   Additional Information SSN [id number] Clydene Pugh, LCSW

## 2020-11-06 NOTE — Progress Notes (Signed)
Brief progress note. I did not see patient today.  Reviewed patient's chart and discussed with nurse and hospitalists.   Per nursing staff, patient has not had any overt GI bleeding. No complaint of abdominal pain, nausea, or vomiting.  He seems stable from respiratory standpoint.  Maintaining well on 2 L nasal cannula.  Continues with intermittent productive cough.  Remains somewhat confused.   Assessment: 75 year old male admitted with acute respiratory failure in setting of COVID-19 pneumonia, found to have heme positive stool and progressively worsening anemia.  Hemoglobin 12.2 on admission, declined on 1/4 and stayed in this range for a few days.  Hemoglobin 7.1 on 1/10 and further decline to 6.7 on the evening of 1/10.  He has received 1 unit PRBCs between 1/10-1/11 with posttransfusion hemoglobin 7.6.  Hemoglobin stable at 7.8 today.  He has had no overt GI bleeding. BUN has been elevated, but this is also in the setting of CKD. Ferritin elevated at 419, iron elevated at 268, saturation elevated at 86%, not suggestive of IDA although this could be an acute phase reactant in the setting of COVID. Notably, he was hypotensive on 1/9 and received two 500 mL fluid boluses on 1/9 and 1/10 and IV fluids at 100 mL/h were also resumed on 1/9.  Hemodilution may have accounted for the additional decline in hemoglobin requiring blood transfusion.    Anemia is multifactorial in the setting of acute illness, CKD, possible occult GI bleed.  Considering progressive anemia with no overt GI bleeding, also questioned component of hemolysis.  LDH elevated at 223, haptoglobin in process.  We will also check reticulocyte count.  We have recommended holding off on inpatient endoscopic evaluation in light of acute illness as it is not clear that he has a significant GI bleed contributing to his progressive anemia.  Had considerd EGD today in light of anemia requiring blood transfusion, but discussed with Dr. Laural Golden who  recommended holding off on EGD for now.  We will update Hemoccult today with further recommendations to follow.   Plan:  1.  Continue PPI twice daily. 2.  Resume soft diet. 3.  Reticulocyte count. 4.  Hemoccult. 5.  Monitor H/H. 6.  Monitor for overt GI bleeding. 7.  Avoid all NSAIDs.  8.  Further recommendations following hemoccult status. If negative, will continue with plans for outpatient EGD/TCS.   Aliene Altes, PA-C [facility] 11/06/2020

## 2020-11-07 ENCOUNTER — Inpatient Hospital Stay (HOSPITAL_COMMUNITY): Payer: Medicare Other

## 2020-11-07 DIAGNOSIS — U071 COVID-19: Secondary | ICD-10-CM | POA: Diagnosis not present

## 2020-11-07 DIAGNOSIS — N184 Chronic kidney disease, stage 4 (severe): Secondary | ICD-10-CM | POA: Diagnosis not present

## 2020-11-07 DIAGNOSIS — J1282 Pneumonia due to coronavirus disease 2019: Secondary | ICD-10-CM | POA: Diagnosis not present

## 2020-11-07 LAB — GLUCOSE, CAPILLARY
Glucose-Capillary: 104 mg/dL — ABNORMAL HIGH (ref 70–99)
Glucose-Capillary: 105 mg/dL — ABNORMAL HIGH (ref 70–99)
Glucose-Capillary: 105 mg/dL — ABNORMAL HIGH (ref 70–99)
Glucose-Capillary: 105 mg/dL — ABNORMAL HIGH (ref 70–99)
Glucose-Capillary: 113 mg/dL — ABNORMAL HIGH (ref 70–99)
Glucose-Capillary: 86 mg/dL (ref 70–99)

## 2020-11-07 LAB — CBC
HCT: 27.4 % — ABNORMAL LOW (ref 39.0–52.0)
HCT: 25.8 % — ABNORMAL LOW (ref 39.0–52.0)
Hemoglobin: 8.2 g/dL — ABNORMAL LOW (ref 13.0–17.0)
Hemoglobin: 8.1 g/dL — ABNORMAL LOW (ref 13.0–17.0)
MCH: 30.7 pg (ref 26.0–34.0)
MCH: 32 pg (ref 26.0–34.0)
MCHC: 29.9 g/dL — ABNORMAL LOW (ref 30.0–36.0)
MCHC: 31.4 g/dL (ref 30.0–36.0)
MCV: 102.6 fL — ABNORMAL HIGH (ref 80.0–100.0)
MCV: 102 fL — ABNORMAL HIGH (ref 80.0–100.0)
Platelets: 222 10*3/uL (ref 150–400)
Platelets: 228 10*3/uL (ref 150–400)
RBC: 2.67 MIL/uL — ABNORMAL LOW (ref 4.22–5.81)
RBC: 2.53 MIL/uL — ABNORMAL LOW (ref 4.22–5.81)
RDW: 19.2 % — ABNORMAL HIGH (ref 11.5–15.5)
RDW: 19.2 % — ABNORMAL HIGH (ref 11.5–15.5)
WBC: 10.3 10*3/uL (ref 4.0–10.5)
WBC: 11.5 10*3/uL — ABNORMAL HIGH (ref 4.0–10.5)
nRBC: 0 % (ref 0.0–0.2)
nRBC: 0 % (ref 0.0–0.2)

## 2020-11-07 LAB — COMPREHENSIVE METABOLIC PANEL
ALT: 79 U/L — ABNORMAL HIGH (ref 0–44)
AST: 29 U/L (ref 15–41)
Albumin: 2.5 g/dL — ABNORMAL LOW (ref 3.5–5.0)
Alkaline Phosphatase: 35 U/L — ABNORMAL LOW (ref 38–126)
Anion gap: 6 (ref 5–15)
BUN: 61 mg/dL — ABNORMAL HIGH (ref 8–23)
CO2: 28 mmol/L (ref 22–32)
Calcium: 8.1 mg/dL — ABNORMAL LOW (ref 8.9–10.3)
Chloride: 109 mmol/L (ref 98–111)
Creatinine, Ser: 2.01 mg/dL — ABNORMAL HIGH (ref 0.61–1.24)
GFR, Estimated: 35 mL/min — ABNORMAL LOW (ref >60)
Glucose, Bld: 118 mg/dL — ABNORMAL HIGH (ref 70–99)
Potassium: 4 mmol/L (ref 3.5–5.1)
Sodium: 143 mmol/L (ref 135–145)
Total Bilirubin: 0.9 mg/dL (ref 0.3–1.2)
Total Protein: 5.4 g/dL — ABNORMAL LOW (ref 6.5–8.1)

## 2020-11-07 LAB — VITAMIN B12: Vitamin B-12: 281 pg/mL (ref 180–914)

## 2020-11-07 LAB — HAPTOGLOBIN: Haptoglobin: 115 mg/dL (ref 34–355)

## 2020-11-07 LAB — FOLATE: Folate: 7.6 ng/mL (ref >5.9)

## 2020-11-07 LAB — MAGNESIUM: Magnesium: 2.2 mg/dL (ref 1.7–2.4)

## 2020-11-07 MED ORDER — FUROSEMIDE 10 MG/ML IJ SOLN
20.0000 mg | Freq: Once | INTRAMUSCULAR | Status: AC
  Administered 2020-11-07: 20 mg via INTRAVENOUS
  Filled 2020-11-07: qty 2

## 2020-11-07 NOTE — Care Management Important Message (Signed)
Important Message  Patient Details  Name: MARCIANO MUNDT MRN: [id number] Date of Birth: 1946/02/21   Medicare Important Message Given:  Yes     Ihor Gully, LCSW 11/07/2020, 1:13 PM

## 2020-11-07 NOTE — Progress Notes (Signed)
PROGRESS NOTE  Darren Burke OZH:[id number] DOB: [birth year] DOA: 10/21/2020 PCP: Rosita Fire, MD  Brief History:  Darren Burke a 75 y.o.malewith medical history significantfor longtimetobacco use, COPD(emphysema),GERD,stage IV CKD, chronic constipation, hypertension, paroxysmal atrial flutter who presented to the emergency department from PCP office with shortness of breath and hypoxia. He was noted to have a pulse ox of 80 to 81% on room air. Patient was admitted with acute hypoxemic respiratory failure secondary to Covid pneumonia and is unvaccinated.He was treated with remdesivir, steroids and baricitinib. Overall hypoxemia significantly improved. He developed significant encephalopathy, which may be related to COVID versus hospital delirium. Overall mental status appears to be improving at this time. CT head with no acute findings.He was also noted to be in atrial fibrillation and is currently rate controlled. Hospital course further complicated by development of significant anemia. He has elevated BUN and hemoglobin has drifted down since admission. He was transfused 1 unit of PRBC on 1/10. Concern for upper GI bleed. GI following and consideringEGD if any signs of active bleed  Assessment/Plan: Acute respiratory failure with hypoxia secondary to Covid pneumonia -patient is unvaccinated. He has underlying chronic lung disease and other co-morbidities. He remains HighRisk for adverse outcomeandseverelungparenchymal injury. He and his family initially refused the use of remdesivir;thenon 10/22/2020 accepted to be treated along with the use of steroids. Patient with good response to the initiation of barcitinib. Currently, he has completed his steroids, remdesivir and baricitinib course.  Acute encephalopathy-multifactorial.  -Family reports that he may have some baseline cognitive deficits and early signs of dementia at baseline. CT head found  to be negative. Suspect that worsening mental status was related to acute infectious process/metabolic issues. Limit sedating medications. Overall mental status appears to be improving  Bacteremia -contaminant -one set blood culture positive for micrococcus likely contaminant. Repeat blood cultures negative. Rocephin was discontinued  Fluid overload -due to fluid resuscitation -lasix IV x 1 -11/07/20--personally reviewed CXR--increase interstitial markings; bilateral patchy opacities  Euthyroid sick syndrome Low TSH. Free T4 is normal. Repeat labs in 3 to 4 weeks as an outpatient  Chronic stage IV kidney disease  -Baseline creatinine approximately 2.2-2.5.  -His creatinine is stable overall and will continue following trend. Continue minimizing nephrotoxic agents and avoiding hypotension. Patient has markedly elevated BUN. His FOBT is also positive. Concern for GI bleeding.  Blood Loss Anemia  -Noted to have FOBT positive and dark stools. BUN is markedly elevated. Aspirin and heparin have been discontinued. GI following and plans on possible EGD if any signs of active bleed -Hgb trending down from 12>>7 during this admission -continue PPI -Doubt hemolysis with normal bilirubin -multifactorial including CKDchronic disease, numerous blood draws, dilution -one unit PRBC transfused on 11/04/20 -discussed with Dr. Laural Golden 11/06/20  COPD with acute exacerbation  -secondary to Covid infection treated with IV steroids and bronchodilators as mentioned above. Overall respiratory status has stabilized and steroids have been discontinued  Paroxysmal atrial flutter -RVR improved -He is on carvediloland diltiazem for rate control.Heart rate is currently stableChadsvasc score of 2 for hypertension and age.  -Not a candidate for anticoagulation at this time due to concerns for GI bleeding. -start ASA 81 mg after d/c  Chronic constipation  - laxative therapy ordered.  Follow response and further improve bowel management as required.  Transaminitis  -reactive from current Covid infection. Currently stable. Continue to monitor.  Hypernatremia/hyperkalemia:  -Improved after receiving hypotonic fluids and Lokelma. Continue to monitor  Goals of care.  -Detailed discussion with the patient's daughter regarding his expected prognosis. Recommended DNR status and she is in agreement. She wishes to continue with current treatments, but if he were to develop cardiac/respiratory arrest, she would not want heroic measures       Status is: Inpatient  Remains inpatient appropriate because:IV treatments appropriate due to intensity of illness or inability to take PO   Dispo: The patient is from: Home  Anticipated d/c is to: Home  Anticipated d/c date is: 1 day  Patient currently is not medically stable to d/c.        Family Communication:  daughter updated 1/13  Consultants:  Palliative; GI  Code Status: DNR  DVT Prophylaxis:  SCDs        Subjective: Patient complains of sob.  Denies f/c cp, n/v/d, abd pain.  Objective: Vitals:   11/06/20 1900 11/07/20 0500 11/07/20 0529 11/07/20 1001  BP: (!) 157/90  110/64 109/70  Pulse: 75  (!) 59 85  Resp: 20  19 18   Temp: 98 F (36.7 C)  98.1 F (36.7 C)   TempSrc:      SpO2: 98%  97% 97%  Weight:  98.1 kg    Height:        Intake/Output Summary (Last 24 hours) at 11/07/2020 1331 Last data filed at 11/07/2020 0900 Gross per 24 hour  Intake 480 ml  Output 1900 ml  Net -1420 ml   Weight change:  Exam:   General:  Pt is alert, follows commands appropriately, not in acute distress  HEENT: No icterus, No thrush, No neck mass, [place]/AT  Cardiovascular: RRR, S1/S2, no rubs, no gallops  Respiratory:bilateral rales.  No wheeze  Abdomen: Soft/+BS, non tender, non distended, no guarding  Extremities: 1+LE edema, No  lymphangitis, No petechiae, No rashes, no synovitis   Data Reviewed: I have personally reviewed following labs and imaging studies Basic Metabolic Panel: Recent Labs  Lab 11/03/20 0728 11/04/20 0538 11/04/20 2334 11/06/20 0646 11/07/20 0631  NA 148* 147* 140 141 143  K 4.6 4.5 4.3 4.1 4.0  CL 111 110 106 108 109  CO2 29 26 25 26 28   GLUCOSE 125* 93 104* 99 118*  BUN 118* 103* 98* 74* 61*  CREATININE 2.52* 2.31* 2.35* 2.21* 2.01*  CALCIUM 8.6* 8.1* 7.8* 7.8* 8.1*  MG  --   --   --   --  2.2   Liver Function Tests: Recent Labs  Lab 11/02/20 0640 11/06/20 0646 11/07/20 0631  AST 40 30 29  ALT 125* 87* 79*  ALKPHOS 35* 31* 35*  BILITOT 0.8 0.8 0.9  PROT 6.2* 5.3* 5.4*  ALBUMIN 2.6* 2.4* 2.5*   No results for input(s): LIPASE, AMYLASE in the last 168 hours. No results for input(s): AMMONIA in the last 168 hours. Coagulation Profile: No results for input(s): INR, PROTIME in the last 168 hours. CBC: Recent Labs  Lab 11/04/20 2334 11/05/20 0930 11/06/20 0646 11/07/20 0631 11/07/20 1011  WBC 7.3 8.4 8.3 10.3 11.5*  HGB 6.7* 7.6* 7.8* 8.2* 8.1*  HCT 23.5* 25.4* 25.7* 27.4* 25.8*  MCV 105.9* 100.8* 100.8* 102.6* 102.0*  PLT 238 229 210 222 228   Cardiac Enzymes: No results for input(s): CKTOTAL, CKMB, CKMBINDEX, TROPONINI in the last 168 hours. BNP: Invalid input(s): POCBNP CBG: Recent Labs  Lab 11/06/20 2135 11/06/20 2355 11/07/20 0530 11/07/20 0759 11/07/20 1137  GLUCAP 218* 95 86 105* 105*   HbA1C: No results for input(s): HGBA1C in the last  72 hours. Urine analysis:    Component Value Date/Time   COLORURINE YELLOW 10/25/2020 1214   APPEARANCEUR HAZY (A) 10/25/2020 1214   LABSPEC 1.015 10/25/2020 1214   PHURINE 5.0 10/25/2020 1214   GLUCOSEU NEGATIVE 10/25/2020 1214   HGBUR MODERATE (A) 10/25/2020 1214   BILIRUBINUR NEGATIVE 10/25/2020 1214   KETONESUR NEGATIVE 10/25/2020 1214   PROTEINUR 30 (A) 10/25/2020 1214   UROBILINOGEN 0.2 11/10/2014  0400   NITRITE NEGATIVE 10/25/2020 1214   LEUKOCYTESUR NEGATIVE 10/25/2020 1214   Sepsis Labs: @LABRCNTIP (procalcitonin:4,lacticidven:4) )No results found for this or any previous visit (from the past 240 hour(s)).   Scheduled Meds: . (feeding supplement) PROSource Plus  30 mL Oral BID BM  . vitamin C  500 mg Oral Daily  . carvedilol  6.25 mg Per Tube BID WC  . Chlorhexidine Gluconate Cloth  6 each Topical Daily  . diltiazem  60 mg Oral Q8H  . feeding supplement  237 mL Oral TID BM  . furosemide  20 mg Intravenous Once  . pantoprazole (PROTONIX) IV  40 mg Intravenous Q12H  . zinc sulfate  220 mg Oral Daily   Continuous Infusions: . dextrose Stopped (11/02/20 0444)    Procedures/Studies: CT HEAD WO CONTRAST  Result Date: 10/29/2020 CLINICAL DATA:  Mental status change, unknown cause. Altered mental status, COVID positive. EXAM: CT HEAD WITHOUT CONTRAST TECHNIQUE: Contiguous axial images were obtained from the base of the skull through the vertex without intravenous contrast. COMPARISON:  No pertinent prior exams available for comparison. FINDINGS: Brain: Mildly motion degraded exam. Cerebral volume is normal for age. There is no acute intracranial hemorrhage. No demarcated cortical infarct. No extra-axial fluid collection. No evidence of intracranial mass. No midline shift. Vascular: No hyperdense vessel. Atherosclerotic calcifications Skull: Normal. Negative for fracture or focal lesion. Sinuses/Orbits: Visualized orbits show no acute finding. Scattered secretions within the left ethmoid air cells. Moderate mucosal thickening within the right maxillary sinus with associated chronic reactive osteitis. There is full-thickness erosion of the anterior right maxillary sinus wall inferiorly (for instance as seen on series 3, image 3)(series 5, image 23). Other: Partially visualized nasoenteric tube. IMPRESSION: Mildly motion degraded exam. No evidence of acute intracranial abnormality. Moderate  mucosal thickening within the right maxillary sinus with associated chronic reactive osteitis. There is focal full-thickness erosion of the anterior right maxillary sinus wall inferiorly. Mild left ethmoid sinusitis. Electronically Signed   By: Kellie Simmering DO   On: 10/29/2020 11:43   DG CHEST PORT 1 VIEW  Result Date: 11/07/2020 CLINICAL DATA:  Shortness of breath. The patient tested positive for COVID-19 10/21/2020. EXAM: PORTABLE CHEST 1 VIEW COMPARISON:  Single-view of the chest 10/28/2020, 10/25/2020 one 11/24/2017. PA and lateral chest 12/10/2016. FINDINGS: The lungs are emphysematous. Mild, patchy airspace disease in the right mid and lower lung zones appears mildly improved compared to the most recent study. Heart size is normal. Aortic atherosclerosis. IMPRESSION: Mild improvement in patchy airspace disease in the right lower lung zone compatible with resolving pneumonia. Aortic Atherosclerosis (ICD10-I70.0) and Emphysema (ICD10-J43.9). Electronically Signed   By: Inge Rise M.D.   On: 11/07/2020 12:06   DG CHEST PORT 1 VIEW  Result Date: 10/28/2020 CLINICAL DATA:  Post NG tube placement. EXAM: PORTABLE CHEST 1 VIEW COMPARISON:  10/25/2020 and prior. FINDINGS: Diffuse interstitial prominence. No pneumothorax or pleural effusion. Decreased conspicuity of patchy right basilar opacities. Stable cardiomediastinal silhouette. Non weighted enteric tube tip and side hole overlie the gastric body. IMPRESSION: Enteric tube tip and side hole  overlie the gastric body. Emphysematous changes. Right basilar opacities are less conspicuous than prior exam. Electronically Signed   By: Primitivo Gauze M.D.   On: 10/28/2020 09:38   DG CHEST PORT 1 VIEW  Result Date: 10/25/2020 CLINICAL DATA:  Worsening shortness of breath today. Positive COVID test on 10/21/2020. Former smoker with COPD. EXAM: PORTABLE CHEST 1 VIEW COMPARISON:  10/22/2020 and older studies. FINDINGS: Cardiac silhouette is normal in size.  No mediastinal or hilar masses. Bilateral interstitial thickening noted most evident at the bases, right greater than left, similar to the prior exam. No lung consolidation to suggest pneumonia. No pleural effusion or pneumothorax. Skeletal structures are grossly intact. IMPRESSION: 1. No convincing acute cardiopulmonary disease. 2. Bilateral interstitial thickening, greatest at the right lung base, consistent with fibrosis. Electronically Signed   By: Lajean Manes M.D.   On: 10/25/2020 14:01   Portable chest 1 View  Result Date: 10/22/2020 CLINICAL DATA:  Shortness of breath. EXAM: PORTABLE CHEST 1 VIEW COMPARISON:  10/21/2020.  11/24/2017. FINDINGS: Mediastinum hilar structures normal. Cardiomegaly with pulmonary venous congestion bilateral interstitial prominence. Small bilateral pleural effusions. Findings suggest CHF. Pneumonitis cannot be excluded. Associated bibasilar atelectasis and or infiltrates. Bibasilar pneumonia cannot be excluded. IMPRESSION: 1. Cardiomegaly with pulmonary venous congestion bilateral interstitial prominence. Small bilateral pleural effusions. Findings suggest CHF. 2. Associated bibasilar atelectasis and or infiltrates. Bibasilar pneumonia cannot be excluded. Electronically Signed   By: Marcello Moores  Register   On: 10/22/2020 05:11   DG Chest Portable 1 View  Result Date: 10/21/2020 CLINICAL DATA:  Hypoxia. EXAM: PORTABLE CHEST 1 VIEW COMPARISON:  November 24, 2017. FINDINGS: The heart size and mediastinal contours are within normal limits. No pneumothorax or pleural effusion is noted. Left lung is clear. Right midlung and basilar opacities are noted concerning for pneumonia. The visualized skeletal structures are unremarkable. IMPRESSION: Right midlung and basilar opacities are noted concerning for pneumonia. Electronically Signed   By: Marijo Conception M.D.   On: 10/21/2020 12:59    Orson Eva, DO  [facility]  If 7PM-7AM, please contact  night-coverage [email or web address] Password [email or web address] 11/07/2020, 1:31 PM   LOS: 17 days

## 2020-11-08 ENCOUNTER — Encounter (HOSPITAL_COMMUNITY): Payer: Self-pay | Admitting: Family Medicine

## 2020-11-08 ENCOUNTER — Inpatient Hospital Stay (HOSPITAL_COMMUNITY): Payer: Medicare Other

## 2020-11-08 ENCOUNTER — Telehealth: Payer: Self-pay | Admitting: Nurse Practitioner

## 2020-11-08 DIAGNOSIS — U071 COVID-19: Secondary | ICD-10-CM | POA: Diagnosis not present

## 2020-11-08 DIAGNOSIS — J9601 Acute respiratory failure with hypoxia: Secondary | ICD-10-CM | POA: Diagnosis not present

## 2020-11-08 DIAGNOSIS — Z515 Encounter for palliative care: Secondary | ICD-10-CM | POA: Diagnosis not present

## 2020-11-08 DIAGNOSIS — Z7189 Other specified counseling: Secondary | ICD-10-CM | POA: Diagnosis not present

## 2020-11-08 DIAGNOSIS — J441 Chronic obstructive pulmonary disease with (acute) exacerbation: Secondary | ICD-10-CM | POA: Diagnosis not present

## 2020-11-08 DIAGNOSIS — N184 Chronic kidney disease, stage 4 (severe): Secondary | ICD-10-CM | POA: Diagnosis not present

## 2020-11-08 LAB — TROPONIN I (HIGH SENSITIVITY)
Troponin I (High Sensitivity): 20 ng/L — ABNORMAL HIGH (ref <18)
Troponin I (High Sensitivity): 19 ng/L — ABNORMAL HIGH (ref <18)

## 2020-11-08 LAB — PROCALCITONIN: Procalcitonin: 0.11 ng/mL

## 2020-11-08 LAB — GLUCOSE, CAPILLARY
Glucose-Capillary: 103 mg/dL — ABNORMAL HIGH (ref 70–99)
Glucose-Capillary: 91 mg/dL (ref 70–99)
Glucose-Capillary: 119 mg/dL — ABNORMAL HIGH (ref 70–99)
Glucose-Capillary: 136 mg/dL — ABNORMAL HIGH (ref 70–99)

## 2020-11-08 LAB — BASIC METABOLIC PANEL
Anion gap: 9 (ref 5–15)
BUN: 57 mg/dL — ABNORMAL HIGH (ref 8–23)
CO2: 29 mmol/L (ref 22–32)
Calcium: 8.3 mg/dL — ABNORMAL LOW (ref 8.9–10.3)
Chloride: 107 mmol/L (ref 98–111)
Creatinine, Ser: 2.1 mg/dL — ABNORMAL HIGH (ref 0.61–1.24)
GFR, Estimated: 33 mL/min — ABNORMAL LOW (ref >60)
Glucose, Bld: 104 mg/dL — ABNORMAL HIGH (ref 70–99)
Potassium: 4.4 mmol/L (ref 3.5–5.1)
Sodium: 145 mmol/L (ref 135–145)

## 2020-11-08 LAB — CBC
HCT: 25.2 % — ABNORMAL LOW (ref 39.0–52.0)
Hemoglobin: 7.6 g/dL — ABNORMAL LOW (ref 13.0–17.0)
MCH: 31.3 pg (ref 26.0–34.0)
MCHC: 30.2 g/dL (ref 30.0–36.0)
MCV: 103.7 fL — ABNORMAL HIGH (ref 80.0–100.0)
Platelets: 200 10*3/uL (ref 150–400)
RBC: 2.43 MIL/uL — ABNORMAL LOW (ref 4.22–5.81)
RDW: 19 % — ABNORMAL HIGH (ref 11.5–15.5)
WBC: 8.1 10*3/uL (ref 4.0–10.5)
nRBC: 0 % (ref 0.0–0.2)

## 2020-11-08 LAB — BRAIN NATRIURETIC PEPTIDE: B Natriuretic Peptide: 199 pg/mL — ABNORMAL HIGH (ref 0.0–100.0)

## 2020-11-08 LAB — MAGNESIUM: Magnesium: 2.3 mg/dL (ref 1.7–2.4)

## 2020-11-08 MED ORDER — METHYLPREDNISOLONE SODIUM SUCC 125 MG IJ SOLR
60.0000 mg | Freq: Two times a day (BID) | INTRAMUSCULAR | Status: DC
  Administered 2020-11-08 – 2020-11-09 (×2): 60 mg via INTRAVENOUS
  Filled 2020-11-08 (×4): qty 2

## 2020-11-08 MED ORDER — IPRATROPIUM-ALBUTEROL 20-100 MCG/ACT IN AERS
2.0000 | INHALATION_SPRAY | Freq: Four times a day (QID) | RESPIRATORY_TRACT | Status: DC
  Administered 2020-11-08 (×2): 2 via RESPIRATORY_TRACT
  Filled 2020-11-08: qty 4

## 2020-11-08 MED ORDER — VITAMIN B-12 100 MCG PO TABS
500.0000 ug | ORAL_TABLET | Freq: Every day | ORAL | Status: DC
  Administered 2020-11-08 – 2020-11-12 (×5): 500 ug via ORAL
  Filled 2020-11-08 (×5): qty 5

## 2020-11-08 MED ORDER — TECHNETIUM TO 99M ALBUMIN AGGREGATED
4.0000 | Freq: Once | INTRAVENOUS | Status: AC | PRN
  Administered 2020-11-08: 4.4 via INTRAVENOUS

## 2020-11-08 MED ORDER — FUROSEMIDE 10 MG/ML IJ SOLN
20.0000 mg | Freq: Once | INTRAMUSCULAR | Status: AC
  Administered 2020-11-08: 20 mg via INTRAVENOUS
  Filled 2020-11-08: qty 2

## 2020-11-08 MED ORDER — IPRATROPIUM-ALBUTEROL 20-100 MCG/ACT IN AERS
2.0000 | INHALATION_SPRAY | Freq: Three times a day (TID) | RESPIRATORY_TRACT | Status: DC
  Administered 2020-11-09 – 2020-11-12 (×11): 2 via RESPIRATORY_TRACT

## 2020-11-08 MED ORDER — FOLIC ACID 1 MG PO TABS
1.0000 mg | ORAL_TABLET | Freq: Every day | ORAL | Status: DC
  Administered 2020-11-08 – 2020-11-12 (×5): 1 mg via ORAL
  Filled 2020-11-08 (×5): qty 1

## 2020-11-08 NOTE — Progress Notes (Signed)
Palliative: Darren Burke has stabilized and is expected to discharge to [facility] for short-term rehab, likely over the weekend.  He is still with a poor appetite, but hopefully this will increase with more stimulation and therapy.  Call to daughter, Darren Burke. We talk about his discharge to [facility]. Darren states she is preparing for what is next for him. She shares her worry about his ability to recover. She asks about HC and durable POA. We talk about logistics related to discharge, clothing needs, transportation.  Darren also asks general ask questions related to short-term rehab and care at the facility that are answered.  Greatly encourage Darren to work with Education officer, museum at [employer] for any issues or concerns.   Plan:  Short term rehab at [facility], [place]. Continue to treat the treatable.  At this point will re hospitalize as needed.   25 minutes  Quinn Axe, NP Palliative Medicine Team  Team Phone [phone] Greater than 50% of this time was spent counseling and coordinating care related to the above assessment and plan.

## 2020-11-08 NOTE — Progress Notes (Signed)
PT Cancellation Note  Patient Details Name: Darren Burke MRN: [id number] DOB: 1946-07-19   Cancelled Treatment:    Reason Eval/Treat Not Completed: Medical issues which prohibited therapy.  Physical therapy held secondary R/O PE and high troponin per RN.     2:04 PM, 11/08/20 Lonell Grandchild, MPT Physical Therapist with [facility] [phone] office [phone] mobile phone

## 2020-11-08 NOTE — Progress Notes (Signed)
PROGRESS NOTE  Darren Burke:[id number] DOB: December 25, 1945 DOA: 10/21/2020 PCP: Rosita Fire, MD   Brief History:  Darren Burke a 75 y.o.malewith medical history significantfor longtimetobacco use, COPD(emphysema),GERD,stage IV CKD, chronic constipation, hypertension, paroxysmal atrial flutter who presented to the emergency department from PCP office with shortness of breath and hypoxia. He was noted to have a pulse ox of 80 to 81% on room air. Patient was admitted with acute hypoxemic respiratory failure secondary to Covid pneumonia and is unvaccinated.He was treated with remdesivir, steroids and baricitinib. Overall hypoxemia significantly improved. He developed significant encephalopathy, which may be related to COVID versus hospital delirium. Overall mental status appears to be improving at this time. CT head with no acute findings.He was also noted to be in atrial fibrillation and is currently rate controlled. Hospital course further complicated by development of significant anemia. He has elevated BUN and hemoglobin has drifted down since admission. He was transfused 1 unit of PRBC on 1/10. Concern for upper GI bleed. GI following and consideringEGDif any signs of active bleed  Assessment/Plan: Acute respiratory failure with hypoxia secondary to Covid pneumonia -patient is unvaccinated. He has underlying chronic lung disease and other co-morbidities. He remains HighRisk for adverse outcomeandseverelungparenchymal injury. He and his family initially refused the use of remdesivir;thenon 10/22/2020 accepted to be treated along with the use of steroids. Patient with good response to the initiation of barcitinib. Currently, he has completed his steroids, remdesivir and baricitinib course. -11/07/20--remains sob--given IV lasix 20 mg x1 -11/08/20--remains sob-->repeat CXR, check troponins, V/Q scan, check PCT -1/14--restart IV steroids  Acute  encephalopathy-multifactorial. -Family reports that he may have some baseline cognitive deficits and early signs of dementia at baseline. CT head found to be negative. Suspect that worsening mental status was related to acute infectious process/metabolic issues. Limit sedating medications.  -Overall mental status appears to be improving  Bacteremia -contaminant -oneset blood culture positive for micrococcus likely contaminant. Repeat blood cultures negative. Rocephin was discontinued  Fluid overload -due to fluid resuscitation -lasix IV x 1 -11/07/20--personally reviewed CXR--increase interstitial markings; bilateral patchy opacities  Euthyroid sick syndrome Low TSH. Free T4 is normal. Repeat labs in 3 to 4 weeks as an outpatient  Chronic stage IV kidney disease  -Baseline creatinine approximately 2.2-2.5. -His creatinine is stable overall and will continue following trend. Continue minimizing nephrotoxic agents and avoiding hypotension. Patient has markedly elevated BUN. His FOBT is also positive. Concern for GI bleeding.  Blood Loss Anemia  -Noted to have FOBT positive and dark stools. BUN is markedly elevated. Aspirin and heparin have been discontinued. GI following and plans on possible EGDif any signs of active bleed -Hgb trending down from 12>>7 during this admission -continue PPI -Doubt hemolysis with normal bilirubin -multifactorial including CKDchronic disease, numerous blood draws, dilution -one unit PRBC transfused on 11/04/20 -discussed with Dr. Laural Golden 11/06/20  COPD with acute exacerbation -secondary to Covid infection treated with IV steroids and bronchodilators as mentioned above. Overall respiratory status had stabilized -1/14--wheeze and sob-->restart IV steroids and start combivent  Paroxysmal atrial flutter -RVRimproved -He is on carvediloland diltiazem for rate control.Heart rate is currently stableChadsvasc score of 2 for  hypertension and age. -Not a candidate for anticoagulation at this time due to concerns for GI bleeding. -start ASA 81 mg after d/c  Chronic constipation  - laxative therapy ordered. Follow response and further improve bowel management as required.  Transaminitis -reactive from current Covid infection. Currently stable. Continue  to monitor.  Hypernatremia/hyperkalemia: -Improved after receiving hypotonic fluids and Lokelma. Continue to monitor  Goals of care. -Detailed discussion with the patient's daughter regarding his expected prognosis. Recommended DNR status and she is in agreement. She wishes to continue with current treatments, but if he were to develop cardiac/respiratory arrest, she would not want heroic measures       Status is: Inpatient  Remains inpatient appropriate because:IV treatments appropriate due to intensity of illness or inability to take PO   Dispo: The patient is from:Home Anticipated d/c is MO:QHUT Anticipated d/c date is: 1 day Patient currently is not medically stable to d/c.        Family Communication:daughter updated 1/14  Consultants:Palliative; GI  Code Status: DNR  DVT Prophylaxis: SCDs     Subjective: Patient complains of cp and sob and wheeze.  Denies f/c, n/v/d.  Chest pain is intermittent, substernal, and mild.  Objective: Vitals:   11/07/20 1001 11/07/20 1452 11/07/20 2051 11/08/20 0518  BP: 109/70 128/75 96/65 111/68  Pulse: 85 71 85 83  Resp: 18 20 20 19   Temp:  97.7 F (36.5 C) 97.6 F (36.4 C) 98 F (36.7 C)  TempSrc:  Oral Axillary   SpO2: 97% 96% 95%   Weight:      Height:        Intake/Output Summary (Last 24 hours) at 11/08/2020 1031 Last data filed at 11/08/2020 0500 Gross per 24 hour  Intake 360 ml  Output 1700 ml  Net -1340 ml   Weight change:  Exam:   General:  Pt is alert, follows commands appropriately, not  in acute distress  HEENT: No icterus, No thrush, No neck mass, /AT  Cardiovascular: RRR, S1/S2, no rubs, no gallops  Respiratory: bilateral rales.  Bilateral wheeze  Abdomen: Soft/+BS, non tender, non distended, no guarding  Extremities: trace LE edema, No lymphangitis, No petechiae, No rashes, no synovitis   Data Reviewed: I have personally reviewed following labs and imaging studies Basic Metabolic Panel: Recent Labs  Lab 11/04/20 0538 11/04/20 2334 11/06/20 0646 11/07/20 0631 11/08/20 0437  NA 147* 140 141 143 145  K 4.5 4.3 4.1 4.0 4.4  CL 110 106 108 109 107  CO2 26 25 26 28 29   GLUCOSE 93 104* 99 118* 104*  BUN 103* 98* 74* 61* 57*  CREATININE 2.31* 2.35* 2.21* 2.01* 2.10*  CALCIUM 8.1* 7.8* 7.8* 8.1* 8.3*  MG  --   --   --  2.2 2.3   Liver Function Tests: Recent Labs  Lab 11/02/20 0640 11/06/20 0646 11/07/20 0631  AST 40 30 29  ALT 125* 87* 79*  ALKPHOS 35* 31* 35*  BILITOT 0.8 0.8 0.9  PROT 6.2* 5.3* 5.4*  ALBUMIN 2.6* 2.4* 2.5*   No results for input(s): LIPASE, AMYLASE in the last 168 hours. No results for input(s): AMMONIA in the last 168 hours. Coagulation Profile: No results for input(s): INR, PROTIME in the last 168 hours. CBC: Recent Labs  Lab 11/04/20 2334 11/05/20 0930 11/06/20 0646 11/07/20 0631 11/07/20 1011  WBC 7.3 8.4 8.3 10.3 11.5*  HGB 6.7* 7.6* 7.8* 8.2* 8.1*  HCT 23.5* 25.4* 25.7* 27.4* 25.8*  MCV 105.9* 100.8* 100.8* 102.6* 102.0*  PLT 238 229 210 222 228   Cardiac Enzymes: No results for input(s): CKTOTAL, CKMB, CKMBINDEX, TROPONINI in the last 168 hours. BNP: Invalid input(s): POCBNP CBG: Recent Labs  Lab 11/07/20 1730 11/07/20 2016 11/07/20 2339 11/08/20 0517 11/08/20 0800  GLUCAP 113* 104* 105* 103* 91  HbA1C: No results for input(s): HGBA1C in the last 72 hours. Urine analysis:    Component Value Date/Time   COLORURINE YELLOW 10/25/2020 1214   APPEARANCEUR HAZY (A) 10/25/2020 1214   LABSPEC 1.015  10/25/2020 1214   PHURINE 5.0 10/25/2020 1214   GLUCOSEU NEGATIVE 10/25/2020 1214   HGBUR MODERATE (A) 10/25/2020 1214   BILIRUBINUR NEGATIVE 10/25/2020 1214   KETONESUR NEGATIVE 10/25/2020 1214   PROTEINUR 30 (A) 10/25/2020 1214   UROBILINOGEN 0.2 11/10/2014 0400   NITRITE NEGATIVE 10/25/2020 1214   LEUKOCYTESUR NEGATIVE 10/25/2020 1214   Sepsis Labs: @LABRCNTIP (procalcitonin:4,lacticidven:4) )No results found for this or any previous visit (from the past 240 hour(s)).   Scheduled Meds: . (feeding supplement) PROSource Plus  30 mL Oral BID BM  . vitamin C  500 mg Oral Daily  . carvedilol  6.25 mg Per Tube BID WC  . Chlorhexidine Gluconate Cloth  6 each Topical Daily  . diltiazem  60 mg Oral Q8H  . feeding supplement  237 mL Oral TID BM  . pantoprazole (PROTONIX) IV  40 mg Intravenous Q12H  . zinc sulfate  220 mg Oral Daily   Continuous Infusions: . dextrose Stopped (11/02/20 0444)    Procedures/Studies: CT HEAD WO CONTRAST  Result Date: 10/29/2020 CLINICAL DATA:  Mental status change, unknown cause. Altered mental status, COVID positive. EXAM: CT HEAD WITHOUT CONTRAST TECHNIQUE: Contiguous axial images were obtained from the base of the skull through the vertex without intravenous contrast. COMPARISON:  No pertinent prior exams available for comparison. FINDINGS: Brain: Mildly motion degraded exam. Cerebral volume is normal for age. There is no acute intracranial hemorrhage. No demarcated cortical infarct. No extra-axial fluid collection. No evidence of intracranial mass. No midline shift. Vascular: No hyperdense vessel. Atherosclerotic calcifications Skull: Normal. Negative for fracture or focal lesion. Sinuses/Orbits: Visualized orbits show no acute finding. Scattered secretions within the left ethmoid air cells. Moderate mucosal thickening within the right maxillary sinus with associated chronic reactive osteitis. There is full-thickness erosion of the anterior right maxillary  sinus wall inferiorly (for instance as seen on series 3, image 3)(series 5, image 23). Other: Partially visualized nasoenteric tube. IMPRESSION: Mildly motion degraded exam. No evidence of acute intracranial abnormality. Moderate mucosal thickening within the right maxillary sinus with associated chronic reactive osteitis. There is focal full-thickness erosion of the anterior right maxillary sinus wall inferiorly. Mild left ethmoid sinusitis. Electronically Signed   By: Kellie Simmering DO   On: 10/29/2020 11:43   DG CHEST PORT 1 VIEW  Result Date: 11/07/2020 CLINICAL DATA:  Shortness of breath. The patient tested positive for COVID-19 10/21/2020. EXAM: PORTABLE CHEST 1 VIEW COMPARISON:  Single-view of the chest 10/28/2020, 10/25/2020 one 11/24/2017. PA and lateral chest 12/10/2016. FINDINGS: The lungs are emphysematous. Mild, patchy airspace disease in the right mid and lower lung zones appears mildly improved compared to the most recent study. Heart size is normal. Aortic atherosclerosis. IMPRESSION: Mild improvement in patchy airspace disease in the right lower lung zone compatible with resolving pneumonia. Aortic Atherosclerosis (ICD10-I70.0) and Emphysema (ICD10-J43.9). Electronically Signed   By: Inge Rise M.D.   On: 11/07/2020 12:06   DG CHEST PORT 1 VIEW  Result Date: 10/28/2020 CLINICAL DATA:  Post NG tube placement. EXAM: PORTABLE CHEST 1 VIEW COMPARISON:  10/25/2020 and prior. FINDINGS: Diffuse interstitial prominence. No pneumothorax or pleural effusion. Decreased conspicuity of patchy right basilar opacities. Stable cardiomediastinal silhouette. Non weighted enteric tube tip and side hole overlie the gastric body. IMPRESSION: Enteric tube tip and side  hole overlie the gastric body. Emphysematous changes. Right basilar opacities are less conspicuous than prior exam. Electronically Signed   By: Primitivo Gauze M.D.   On: 10/28/2020 09:38   DG CHEST PORT 1 VIEW  Result Date:  10/25/2020 CLINICAL DATA:  Worsening shortness of breath today. Positive COVID test on 10/21/2020. Former smoker with COPD. EXAM: PORTABLE CHEST 1 VIEW COMPARISON:  10/22/2020 and older studies. FINDINGS: Cardiac silhouette is normal in size. No mediastinal or hilar masses. Bilateral interstitial thickening noted most evident at the bases, right greater than left, similar to the prior exam. No lung consolidation to suggest pneumonia. No pleural effusion or pneumothorax. Skeletal structures are grossly intact. IMPRESSION: 1. No convincing acute cardiopulmonary disease. 2. Bilateral interstitial thickening, greatest at the right lung base, consistent with fibrosis. Electronically Signed   By: Lajean Manes M.D.   On: 10/25/2020 14:01   Portable chest 1 View  Result Date: 10/22/2020 CLINICAL DATA:  Shortness of breath. EXAM: PORTABLE CHEST 1 VIEW COMPARISON:  10/21/2020.  11/24/2017. FINDINGS: Mediastinum hilar structures normal. Cardiomegaly with pulmonary venous congestion bilateral interstitial prominence. Small bilateral pleural effusions. Findings suggest CHF. Pneumonitis cannot be excluded. Associated bibasilar atelectasis and or infiltrates. Bibasilar pneumonia cannot be excluded. IMPRESSION: 1. Cardiomegaly with pulmonary venous congestion bilateral interstitial prominence. Small bilateral pleural effusions. Findings suggest CHF. 2. Associated bibasilar atelectasis and or infiltrates. Bibasilar pneumonia cannot be excluded. Electronically Signed   By: Marcello Moores  Register   On: 10/22/2020 05:11   DG Chest Portable 1 View  Result Date: 10/21/2020 CLINICAL DATA:  Hypoxia. EXAM: PORTABLE CHEST 1 VIEW COMPARISON:  November 24, 2017. FINDINGS: The heart size and mediastinal contours are within normal limits. No pneumothorax or pleural effusion is noted. Left lung is clear. Right midlung and basilar opacities are noted concerning for pneumonia. The visualized skeletal structures are unremarkable. IMPRESSION:  Right midlung and basilar opacities are noted concerning for pneumonia. Electronically Signed   By: Marijo Conception M.D.   On: 10/21/2020 12:59    Orson Eva, DO  [facility]  If 7PM-7AM, please contact night-coverage [email or web address] Password [email or web address] 11/08/2020, 10:31 AM   LOS: 18 days

## 2020-11-08 NOTE — Care Management Important Message (Signed)
Important Message  Patient Details  Name: Darren Burke MRN: [id number] Date of Birth: 14-Nov-1945   Medicare Important Message Given:  Yes     [name] 11/08/2020, 11:22 AM

## 2020-11-08 NOTE — Progress Notes (Signed)
Nutrition Follow-up  DOCUMENTATION CODES:   Obesity unspecified  INTERVENTION:  Continue Ensure Enlive po BID, each supplement provides 350 kcal and 20 grams of protein  Continue Prosource Plus 30 ml BID, each supplement provides 100 kcal and 15 grams of protein  Continue Magic cup TID with meals, each supplement provides 290 kcal and 9 grams of protein  Encourage po intake of meals and supplements  NUTRITION DIAGNOSIS:   Increased nutrient needs related to acute illness (COVID-19) as evidenced by estimated needs. -ongoing  GOAL:   Patient will meet greater than or equal to 90% of their needs -progressing  MONITOR:   PO intake,Labs,Weight trends,Skin,I & O's  REASON FOR ASSESSMENT:   Consult Assessment of nutrition requirement/status  ASSESSMENT:   Darren Burke is a 75 y.o. male with medical history significant for longtime tobacco use, COPD (emphysema), GERD, stage IV CKD, chronic constipation, hypertension, paroxysmal atrial flutter who presented to the emergency department from PCP office with shortness of breath and hypoxia.  He was noted to have a pulse ox of 80 to 81% on room air.  He has been placed on 4 L nasal cannula with improvement of oxygen saturation to 87%.  He does not wear home oxygen.  He has had progressive shortness of breath for the past 2 days.  He feels unwell overall.  He says that he ran out of his albuterol inhaler 4 days ago.  His symptoms have been persistent and progressively getting worse.  His shortness of breath is much worse when ambulating or moving.  He denies chest pain.  He reports a nonproductive cough.  He lives home alone.  He is unvaccinated for COVID-19.  RD working remotely.  1/3-NGT placed; TF started 1/6 -pt pulled NG tube, diet advanced to full liquids 1/7 - Soft diet  RD attempted to contact pt via phone, however he did not answer. He continues to have poor po intake. On 1/13 he consumed 0% x 3 documented meals, on 1/12 he  consumed 0% of breakfast and 75% of dinner, consumed 100% of 1 meal on 1/11 and 0% of 2 documented meals on 1/10. He is drinking EE BID ~65% and ProSource Plus BID ~77%. Patient is also receiving Magic Cup on all meal trays, unsure of how much he is consuming. Supplements provided as ordered provide a total of 1770 kcal and 97 grams of protein daily (meets 77% of minimum kcal needs, 69% minimum protein needs). Will continue supplements, nursing staff continuing to encourage po intake of meals and supplements.   Weight trend: 98.1 kg on 1/13 increased 3.8 kg from 94.3 kg on 1/03  I/Os: +3700 ml since admit UOP: 1700 ml x 24 hrs  Patient with anticipated discharge to SNF over weekend.  Medications reviewed and include: ProSource Plus, Vit C, Coreg, Methylprednisolone, Protonix, Zinc sulfate  Labs: CBGs 621,30,865   Diet Order:   Diet Order            DIET SOFT Room service appropriate? Yes; Fluid consistency: Thin  Diet effective now                 EDUCATION NEEDS:   No education needs have been identified at this time  Skin:  Skin Assessment: Reviewed RN Assessment  Last BM:  1/13 type 4  Height:   Ht Readings from Last 1 Encounters:  10/21/20 5\' 7"  (1.702 m)    Weight:   Wt Readings from Last 1 Encounters:  11/07/20 98.1 kg  Ideal Body Weight:  67.3 kg  BMI:  Body mass index is 33.87 kg/m.  Estimated Nutritional Needs:   Kcal:  5361-4431  Protein:  140-155 grams  Fluid:  > 2 L   Lajuan Lines, RD, LDN Clinical Nutrition After Hours/Weekend Pager # in [place]

## 2020-11-08 NOTE — TOC Progression Note (Signed)
Transition of Care [facility]) - Progression Note    Patient Details  Name: Darren Burke MRN: [id number] Date of Birth: 1946/06/08  Transition of Care [facility]) CM/SW Contact  Chana Lindstrom, Chauncey Reading, RN Phone Number: 11/08/2020, 10:41 AM  Clinical Narrative:   Patient not ready for DC today. Ebony Hail of [facility] aware. Per Ebony Hail, patient can transfer over the weekend.    Expected Discharge Plan: [place] Barriers to Discharge: Continued Medical Work up  Expected Discharge Plan and Services Expected Discharge Plan: [place] In-house Referral: Clinical Social Work Discharge Planning Services: CM Consult Post Acute Care Choice: [place] arrangements for the past 2 months: Apartment                 DME Arranged: Hospital bed,3-N-1,Other see comment (prescription for condom caths) DME Agency: [place]       HH Arranged: RN,PT [place]: [place] Date [place] [facility] Agency Contacted: 11/05/20 Time [place]: [place] Representative spoke with at [place]: [place] ([place]) Interventions    Readmission Risk Interventions Readmission Risk Prevention Plan 11/07/2020 10/24/2020  Transportation Screening Complete Complete  PCP or Specialist Appt within 3-5 Days Complete -  HRI or [place] Complete Complete  Social Work Consult for [place] Planning/Counseling Complete Complete  Palliative Care Screening Complete Not Applicable  Medication Review Press photographer) Complete Complete  Some recent data might be hidden

## 2020-11-08 NOTE — Telephone Encounter (Signed)
Patient likely discharging in the next 24 to 48 hours.  He will need a hospital follow-up appointment with Dr. Jenetta Downer in the next 4 to 6 weeks.  Thanks!

## 2020-11-08 NOTE — Plan of Care (Signed)

## 2020-11-08 NOTE — Progress Notes (Signed)
Reviewed chart, discussed with Dr. Carles Collet (hospitalist).  Initial admission for acute respiratory failure in the setting of COVID-19 pneumonia and was found to have heme positive stool and progressive worsening anemia.  We have held off on endoscopic evaluation due to respiratory issues initially.  Anemia likely deemed multifactorial with acute illness, CKD, possible occult GI bleed.  Continue to recommend holding off on EGD in light of acute illness and in the absence of significant obvious GI bleeding.  Reevaluation of his labs today find that his hemoglobin has been stable over the past 72 hours.  No obvious GI bleeding.  Anticipated discharge over the weekend to short-term rehab.  At this point we will plan to not partake in endoscopic evaluation as an inpatient.  We will follow-up as an outpatient with Dr. Jenetta Downer for hospital follow-up and to consider endoscopy and possible colonoscopy for further evaluation.  We will sign off for now, will remove consult order. Please contact us and enter a new consult order if we can be of further assistance.   Thank you for allowing [place] to participate in the care of [place], DNP, AGNP-C Adult & Gerontological Nurse Practitioner [facility]

## 2020-11-09 DIAGNOSIS — J441 Chronic obstructive pulmonary disease with (acute) exacerbation: Secondary | ICD-10-CM | POA: Diagnosis not present

## 2020-11-09 DIAGNOSIS — U071 COVID-19: Secondary | ICD-10-CM | POA: Diagnosis not present

## 2020-11-09 DIAGNOSIS — J438 Other emphysema: Secondary | ICD-10-CM

## 2020-11-09 DIAGNOSIS — N184 Chronic kidney disease, stage 4 (severe): Secondary | ICD-10-CM | POA: Diagnosis not present

## 2020-11-09 DIAGNOSIS — I4892 Unspecified atrial flutter: Secondary | ICD-10-CM | POA: Diagnosis not present

## 2020-11-09 LAB — CBC
HCT: 25.9 % — ABNORMAL LOW (ref 39.0–52.0)
Hemoglobin: 7.7 g/dL — ABNORMAL LOW (ref 13.0–17.0)
MCH: 30.6 pg (ref 26.0–34.0)
MCHC: 29.7 g/dL — ABNORMAL LOW (ref 30.0–36.0)
MCV: 102.8 fL — ABNORMAL HIGH (ref 80.0–100.0)
Platelets: 217 10*3/uL (ref 150–400)
RBC: 2.52 MIL/uL — ABNORMAL LOW (ref 4.22–5.81)
RDW: 18.6 % — ABNORMAL HIGH (ref 11.5–15.5)
WBC: 8.4 10*3/uL (ref 4.0–10.5)
nRBC: 0 % (ref 0.0–0.2)

## 2020-11-09 LAB — BASIC METABOLIC PANEL
Anion gap: 9 (ref 5–15)
BUN: 64 mg/dL — ABNORMAL HIGH (ref 8–23)
CO2: 28 mmol/L (ref 22–32)
Calcium: 8.2 mg/dL — ABNORMAL LOW (ref 8.9–10.3)
Chloride: 105 mmol/L (ref 98–111)
Creatinine, Ser: 2.08 mg/dL — ABNORMAL HIGH (ref 0.61–1.24)
GFR, Estimated: 33 mL/min — ABNORMAL LOW (ref >60)
Glucose, Bld: 116 mg/dL — ABNORMAL HIGH (ref 70–99)
Potassium: 4.4 mmol/L (ref 3.5–5.1)
Sodium: 142 mmol/L (ref 135–145)

## 2020-11-09 LAB — D-DIMER, QUANTITATIVE: D-Dimer, Quant: 1.45 ug/mL-FEU — ABNORMAL HIGH (ref 0.00–0.50)

## 2020-11-09 LAB — GLUCOSE, CAPILLARY
Glucose-Capillary: 104 mg/dL — ABNORMAL HIGH (ref 70–99)
Glucose-Capillary: 113 mg/dL — ABNORMAL HIGH (ref 70–99)
Glucose-Capillary: 143 mg/dL — ABNORMAL HIGH (ref 70–99)
Glucose-Capillary: 124 mg/dL — ABNORMAL HIGH (ref 70–99)

## 2020-11-09 MED ORDER — METHYLPREDNISOLONE SODIUM SUCC 125 MG IJ SOLR
80.0000 mg | Freq: Two times a day (BID) | INTRAMUSCULAR | Status: DC
  Administered 2020-11-09 – 2020-11-11 (×6): 80 mg via INTRAVENOUS
  Filled 2020-11-09 (×5): qty 2

## 2020-11-09 MED ORDER — FUROSEMIDE 10 MG/ML IJ SOLN
20.0000 mg | Freq: Every day | INTRAMUSCULAR | Status: AC
  Administered 2020-11-09 – 2020-11-12 (×4): 20 mg via INTRAVENOUS
  Filled 2020-11-09 (×4): qty 2

## 2020-11-09 NOTE — Plan of Care (Signed)

## 2020-11-09 NOTE — Progress Notes (Signed)
Physical Therapy Treatment Patient Details Name: Darren Burke MRN: [id number] DOB: [birth year] Today's Date: 11/09/2020    History of Present Illness Darren Burke is a 75 y.o. male with medical history significant for longtime tobacco use, COPD (emphysema), GERD, stage IV CKD, chronic constipation, hypertension, paroxysmal atrial flutter who presented to the emergency department from PCP office with shortness of breath and hypoxia.  He was noted to have a pulse ox of 80 to 81% on room air.  He has been placed on 4 L nasal cannula with improvement of oxygen saturation to 87%.  He does not wear home oxygen.  He has had progressive shortness of breath for the past 2 days.  He feels unwell overall.  He says that he ran out of his albuterol inhaler 4 days ago.  His symptoms have been persistent and progressively getting worse.  His shortness of breath is much worse when ambulating or moving.  He denies chest pain.  He reports a nonproductive cough.  He lives home alone.  He is unvaccinated for COVID-19.    PT Comments    Patient demonstrates improvement for following directions with less confusion, tolerated sitting up at bedside while completing completing BLE ROM/strengthening exercises, required frequent verbal cueing and demonstration to complete exercises with fair carryover, limited to a few side steps with improvement for advancing BLE without having to shuffle feet and tolerated sitting up in chair after therapy - RN/NT notified.  Patient will benefit from continued physical therapy in hospital and recommended venue below to increase strength, balance, endurance for safe ADLs and gait.    Follow Up Recommendations  SNF     Equipment Recommendations       Recommendations for Other Services       Precautions / Restrictions Precautions Precautions: Fall Restrictions Weight Bearing Restrictions: No    Mobility  Bed Mobility Overal bed mobility: Needs Assistance Bed Mobility: Supine  to Sit     Supine to sit: Mod assist     General bed mobility comments: increased time, labored movement  Transfers Overall transfer level: Needs assistance Equipment used: Rolling walker (2 wheeled) Transfers: Sit to/from [facility] Sit to Stand: Mod assist Stand pivot transfers: Mod assist;Max assist       General transfer comment: slow labored movement, difficulty for sit to stands due to BLE weakness  Ambulation/Gait Ambulation/Gait assistance: Mod assist;Max assist Gait Distance (Feet): 5 Feet Assistive device: Rolling walker (2 wheeled) Gait Pattern/deviations: Decreased step length - right;Decreased step length - left;Decreased stride length Gait velocity: slow   General Gait Details: completed 4-5 slow labored side steps with improvement for advancing BLE without requiring tactile assistance to move legs, limited mostly due to fatigue and poor standing balance   Stairs             Wheelchair Mobility    Modified Rankin (Stroke Patients Only)       Balance Overall balance assessment: Needs assistance Sitting-balance support: Feet supported;Bilateral upper extremity supported Sitting balance-Leahy Scale: Fair Sitting balance - Comments: seated at EOB, fair/poor when not supporting self with BUE   Standing balance support: During functional activity;Bilateral upper extremity supported Standing balance-Leahy Scale: Poor Standing balance comment: using RW                            Cognition Arousal/Alertness: Awake/alert Behavior During Therapy: WFL for tasks assessed/performed Overall Cognitive Status: Within Functional Limits for tasks assessed  Exercises General Exercises - Lower Extremity Long Arc Quad: Seated;AROM;Strengthening;10 reps;Both Hip Flexion/Marching: Seated;AROM;Strengthening;10 reps Toe Raises: Seated;AROM;Strengthening;Both;10 reps Heel Raises:  Seated;AROM;Strengthening;Both;10 reps    General Comments        Pertinent Vitals/Pain Pain Assessment: No/denies pain    Home Living                      Prior Function            PT Goals (current goals can now be found in the care plan section) Acute Rehab PT Goals Patient Stated Goal: return home after rehab PT Goal Formulation: With patient Time For Goal Achievement: 11/15/20 Potential to Achieve Goals: Good Progress towards PT goals: Progressing toward goals    Frequency    Min 3X/week      PT Plan Current plan remains appropriate    Co-evaluation              AM-PAC PT "6 Clicks" Mobility   Outcome Measure  Help needed turning from your back to your side while in a flat bed without using bedrails?: A Lot Help needed moving from lying on your back to sitting on the side of a flat bed without using bedrails?: A Lot Help needed moving to and from a bed to a chair (including a wheelchair)?: A Lot Help needed standing up from a chair using your arms (e.g., wheelchair or bedside chair)?: A Lot Help needed to walk in hospital room?: A Lot Help needed climbing 3-5 steps with a railing? : Total 6 Click Score: 11    End of Session Equipment Utilized During Treatment: Oxygen Activity Tolerance: Patient tolerated treatment well;Patient limited by fatigue Patient left: in chair;with call bell/phone within reach;with chair alarm set Nurse Communication: Mobility status PT Visit Diagnosis: Unsteadiness on feet (R26.81);Other abnormalities of gait and mobility (R26.89);Muscle weakness (generalized) (M62.81)     Time: 3329-5188 PT Time Calculation (min) (ACUTE ONLY): 32 min  Charges:  $Therapeutic Exercise: 8-22 mins $Therapeutic Activity: 8-22 mins                     11:24 AM, 11/09/20 Lonell Grandchild, MPT Physical Therapist with [facility] [phone] office [phone] mobile phone

## 2020-11-09 NOTE — Progress Notes (Addendum)
PROGRESS NOTE  Darren Burke YIR:[id number] DOB: 1946-03-31 DOA: 10/21/2020 PCP: Rosita Fire, MD   Brief History: Darren Burke a 75 y.o.malewith medical history significantfor longtimetobacco use, COPD(emphysema),GERD,stage IV CKD, chronic constipation, hypertension, paroxysmal atrial flutter who presented to the emergency department from PCP office with shortness of breath and hypoxia. He was noted to have a pulse ox of 80 to 81% on room air. Patient was admitted with acute hypoxemic respiratory failure secondary to Covid pneumonia and is unvaccinated.He was treated with remdesivir, steroids and baricitinib. Overall hypoxemia significantly improved. He developed significant encephalopathy, which may be related to COVID versus hospital delirium. Overall mental status appears to be improving at this time. CT head with no acute findings.He was also noted to be in atrial fibrillation and is currently rate controlled. Hospital course further complicated by development of significant anemia. He has elevated BUN and hemoglobin has drifted down since admission. He was transfused 1 unit of PRBC on 1/10. Concern for upper GI bleed. GI following and consideringEGDif any signs of active bleed  Assessment/Plan: Acute respiratory failure with hypoxia secondary to Covid pneumonia -patient is unvaccinated. He has underlying chronic lung disease and other co-morbidities. He remains HighRisk for adverse outcomeandseverelungparenchymal injury. He and his family initially refused the use of remdesivir;thenon 10/22/2020 accepted to be treated along with the use of steroids. Patient with good response to the initiation of barcitinib. Currently, he has completed his steroids, remdesivir and baricitinib course. -11/07/20--remains sob--started IV lasix -11/08/20--remains sob-->repeat CXR, check troponins, V/Q scan,  -PCT 0.11 -1/14--restart IV steroids -1/15--increase  IV solumedrol to 80 mg IV bid  Acute encephalopathy-multifactorial. -Family reports that he may have some baseline cognitive deficits and early signs of dementia at baseline. CT head found to be negative. Suspect that worsening mental status was related to acute infectious process/metabolic issues. Limit sedating medications.  -Overall mental status appears to be improved  Atypical Chest pain -partly reproducible -personally reviewed EKG--sinus, nonspecific STT change -personally reviewed CXR--R-base atelectasis, increase interstitial marking -V/Q--indeterminant probability--clinically low suspicion of PE -troponins 10>>19  Bacteremia -contaminant -oneset blood culture positive for micrococcus likely contaminant. Repeat blood cultures negative. Rocephin was discontinued  Fluid overload -due to fluid resuscitation -lasix IV--redose -11/07/20--personally reviewed CXR--increase interstitial markings; bilateral patchy opacities  Euthyroid sick syndrome Low TSH. Free T4 is normal. Repeat labs in 3 to 4 weeks as an outpatient  Chronic stage IV kidney disease  -Baseline creatinine approximately 2.2-2.5. -His creatinine is stable overall and will continue following trend. Continue minimizing nephrotoxic agents and avoiding hypotension. Patient has markedly elevated BUN. His FOBT is also positive. Concern for GI bleeding.  Blood Loss Anemia  -Noted to have FOBT positive and dark stools. BUN is markedly elevated. Aspirin and heparin have been discontinued. GI following and plans on possible EGDif any signs of active bleed -Hgb trending down from 12>>7 during this admission -continue PPI -Doubt hemolysis with normal bilirubin -multifactorial including CKDchronic disease, numerous blood draws, dilution -one unit PRBC transfused on 11/04/20 -discussed with Dr. Laural Golden 11/06/20  COPD with acute exacerbation -secondary to Covid infection treated with IV steroids and  bronchodilators as mentioned above. Overall respiratory status had stabilized -1/14--wheeze and sob-->restart IV steroids and start combivent -1/15--increase IV solumedrol to 80 mg IV bid  Paroxysmal atrial flutter -RVRimproved -He is on carvediloland diltiazem for rate control.Heart rate is currently stableChadsvasc score of 2 for hypertension and age. -Not a candidate for anticoagulation at this time due  to concerns for GI bleeding. -start ASA 81 mg after d/c  Chronic constipation  - laxative therapy ordered. Follow response and further improve bowel management as required.  Transaminitis -reactive from current Covid infection. Currently stable. Continue to monitor.  Hypernatremia/hyperkalemia: -Improved after receiving hypotonic fluids and Lokelma. Continue to monitor  Goals of care. -Detailed discussion with the patient's daughter regarding his expected prognosis. Recommended DNR status and she is in agreement. She wishes to continue with current treatments, but if he were to develop cardiac/respiratory arrest, she would not want heroic measures       Status is: Inpatient  Remains inpatient appropriate because:IV treatments appropriate due to intensity of illness or inability to take PO   Dispo: The patient is from:Home Anticipated d/c is KN:[id number] Anticipated d/c date is: 1 day Patient currently is not medically stable to d/c.        Family Communication:daughter updated 1/15  Consultants:Palliative; GI  Code Status: DNR  DVT Prophylaxis: SCDs       Subjective: Breathing a little better than yesterday.  Denies f/c, cp, n/v/d, abd pain.  Has dry cough.  Objective: Vitals:   11/09/20 0533 11/09/20 0624 11/09/20 0740 11/09/20 1143  BP: 123/75     Pulse: 71 68    Resp: 20     Temp: 98 F (36.7 C)     TempSrc: Oral     SpO2: 90%  91% 91%  Weight:       Height:        Intake/Output Summary (Last 24 hours) at 11/09/2020 1215 Last data filed at 11/09/2020 0900 Gross per 24 hour  Intake 240 ml  Output 700 ml  Net -460 ml   Weight change:  Exam:   General:  Pt is alert, follows commands appropriately, not in acute distress  HEENT: No icterus, No thrush, No neck mass, [place]/AT  Cardiovascular: RRR, S1/S2, no rubs, no gallops  Respiratory: bilateral rhonchi.  Bibasilar wheeze  Abdomen: Soft/+BS, non tender, non distended, no guarding  Extremities: trace LE edema, No lymphangitis, No petechiae, No rashes, no synovitis   Data Reviewed: I have personally reviewed following labs and imaging studies Basic Metabolic Panel: Recent Labs  Lab 11/04/20 2334 11/06/20 0646 11/07/20 0631 11/08/20 0437 11/09/20 0654  NA 140 141 143 145 142  K 4.3 4.1 4.0 4.4 4.4  CL 106 108 109 107 105  CO2 25 26 28 29 28   GLUCOSE 104* 99 118* 104* 116*  BUN 98* 74* 61* 57* 64*  CREATININE 2.35* 2.21* 2.01* 2.10* 2.08*  CALCIUM 7.8* 7.8* 8.1* 8.3* 8.2*  MG  --   --  2.2 2.3  --    Liver Function Tests: Recent Labs  Lab 11/06/20 0646 11/07/20 0631  AST 30 29  ALT 87* 79*  ALKPHOS 31* 35*  BILITOT 0.8 0.9  PROT 5.3* 5.4*  ALBUMIN 2.4* 2.5*   No results for input(s): LIPASE, AMYLASE in the last 168 hours. No results for input(s): AMMONIA in the last 168 hours. Coagulation Profile: No results for input(s): INR, PROTIME in the last 168 hours. CBC: Recent Labs  Lab 11/06/20 0646 11/07/20 0631 11/07/20 1011 11/08/20 1055 11/09/20 0654  WBC 8.3 10.3 11.5* 8.1 8.4  HGB 7.8* 8.2* 8.1* 7.6* 7.7*  HCT 25.7* 27.4* 25.8* 25.2* 25.9*  MCV 100.8* 102.6* 102.0* 103.7* 102.8*  PLT 210 222 228 200 217   Cardiac Enzymes: No results for input(s): CKTOTAL, CKMB, CKMBINDEX, TROPONINI in the last 168 hours. BNP: Invalid input(s): POCBNP  CBG: Recent Labs  Lab 11/08/20 0800 11/08/20 1155 11/08/20 2000 11/09/20 0728 11/09/20 1120  GLUCAP 91  119* 136* 104* 113*   HbA1C: No results for input(s): HGBA1C in the last 72 hours. Urine analysis:    Component Value Date/Time   COLORURINE YELLOW 10/25/2020 1214   APPEARANCEUR HAZY (A) 10/25/2020 1214   LABSPEC 1.015 10/25/2020 1214   PHURINE 5.0 10/25/2020 1214   GLUCOSEU NEGATIVE 10/25/2020 1214   HGBUR MODERATE (A) 10/25/2020 1214   BILIRUBINUR NEGATIVE 10/25/2020 1214   KETONESUR NEGATIVE 10/25/2020 1214   PROTEINUR 30 (A) 10/25/2020 1214   UROBILINOGEN 0.2 11/10/2014 0400   NITRITE NEGATIVE 10/25/2020 1214   LEUKOCYTESUR NEGATIVE 10/25/2020 1214   Sepsis Labs: @LABRCNTIP (procalcitonin:4,lacticidven:4) )No results found for this or any previous visit (from the past 240 hour(s)).   Scheduled Meds: . (feeding supplement) PROSource Plus  30 mL Oral BID BM  . vitamin C  500 mg Oral Daily  . carvedilol  6.25 mg Per Tube BID WC  . Chlorhexidine Gluconate Cloth  6 each Topical Daily  . diltiazem  60 mg Oral Q8H  . feeding supplement  237 mL Oral TID BM  . folic acid  1 mg Oral Daily  . furosemide  20 mg Intravenous Daily  . Ipratropium-Albuterol  2 puff Inhalation TID  . methylPREDNISolone (SOLU-MEDROL) injection  80 mg Intravenous Q12H  . pantoprazole (PROTONIX) IV  40 mg Intravenous Q12H  . vitamin B-12  500 mcg Oral Daily  . zinc sulfate  220 mg Oral Daily   Continuous Infusions: . dextrose Stopped (11/02/20 0444)    Procedures/Studies: CT HEAD WO CONTRAST  Result Date: 10/29/2020 CLINICAL DATA:  Mental status change, unknown cause. Altered mental status, COVID positive. EXAM: CT HEAD WITHOUT CONTRAST TECHNIQUE: Contiguous axial images were obtained from the base of the skull through the vertex without intravenous contrast. COMPARISON:  No pertinent prior exams available for comparison. FINDINGS: Brain: Mildly motion degraded exam. Cerebral volume is normal for age. There is no acute intracranial hemorrhage. No demarcated cortical infarct. No extra-axial fluid  collection. No evidence of intracranial mass. No midline shift. Vascular: No hyperdense vessel. Atherosclerotic calcifications Skull: Normal. Negative for fracture or focal lesion. Sinuses/Orbits: Visualized orbits show no acute finding. Scattered secretions within the left ethmoid air cells. Moderate mucosal thickening within the right maxillary sinus with associated chronic reactive osteitis. There is full-thickness erosion of the anterior right maxillary sinus wall inferiorly (for instance as seen on series 3, image 3)(series 5, image 23). Other: Partially visualized nasoenteric tube. IMPRESSION: Mildly motion degraded exam. No evidence of acute intracranial abnormality. Moderate mucosal thickening within the right maxillary sinus with associated chronic reactive osteitis. There is focal full-thickness erosion of the anterior right maxillary sinus wall inferiorly. Mild left ethmoid sinusitis. Electronically Signed   By: Kellie Simmering DO   On: 10/29/2020 11:43   NM Pulmonary Perfusion  Result Date: 11/08/2020 CLINICAL DATA:  Shortness of breath, hypoxia, COVID-19 positive EXAM: NUCLEAR MEDICINE PERFUSION LUNG SCAN TECHNIQUE: Perfusion images were obtained in multiple projections after intravenous injection of radiopharmaceutical. Ventilation scans intentionally deferred if perfusion scan and chest x-ray adequate for interpretation during COVID 19 epidemic. RADIOPHARMACEUTICALS:  4.4 mCi Tc-74m MAA IV COMPARISON:  Chest x-ray 11/08/2020 FINDINGS: Heterogeneous accumulation of radiotracer likely secondary to underlying COPD. There are 2 left-sided and at least 1 right-sided small to moderate sized segmental perfusion defects. No large segmental perfusion defect. IMPRESSION: 1. Intermediate probability for pulmonary embolism. 2. COPD. Electronically Signed  By: Davina Poke D.O.   On: 11/08/2020 14:31   DG CHEST PORT 1 VIEW  Result Date: 11/08/2020 CLINICAL DATA:  Shortness of breath, confusion this  morning, COVID-19 positive, history atrial fibrillation, hypertension, COPD EXAM: PORTABLE CHEST 1 VIEW COMPARISON:  Portable exam 1037 hours compared to 11/07/2020 FINDINGS: Normal heart size, mediastinal contours, and pulmonary vascularity. Patchy infiltrate identified at RIGHT lung base consistent with pneumonia unchanged. Underlying emphysematous changes and minimal central peribronchial thickening as well as chronic interstitial prominence. No pleural effusion or pneumothorax. Atherosclerotic calcification aorta. Bones demineralized. IMPRESSION: COPD changes with persistent RIGHT basilar infiltrate consistent with pneumonia. Aortic Atherosclerosis (ICD10-I70.0) and Emphysema (ICD10-J43.9). Electronically Signed   By: Lavonia Dana M.D.   On: 11/08/2020 10:50   DG CHEST PORT 1 VIEW  Result Date: 11/07/2020 CLINICAL DATA:  Shortness of breath. The patient tested positive for COVID-19 10/21/2020. EXAM: PORTABLE CHEST 1 VIEW COMPARISON:  Single-view of the chest 10/28/2020, 10/25/2020 one 11/24/2017. PA and lateral chest 12/10/2016. FINDINGS: The lungs are emphysematous. Mild, patchy airspace disease in the right mid and lower lung zones appears mildly improved compared to the most recent study. Heart size is normal. Aortic atherosclerosis. IMPRESSION: Mild improvement in patchy airspace disease in the right lower lung zone compatible with resolving pneumonia. Aortic Atherosclerosis (ICD10-I70.0) and Emphysema (ICD10-J43.9). Electronically Signed   By: Inge Rise M.D.   On: 11/07/2020 12:06   DG CHEST PORT 1 VIEW  Result Date: 10/28/2020 CLINICAL DATA:  Post NG tube placement. EXAM: PORTABLE CHEST 1 VIEW COMPARISON:  10/25/2020 and prior. FINDINGS: Diffuse interstitial prominence. No pneumothorax or pleural effusion. Decreased conspicuity of patchy right basilar opacities. Stable cardiomediastinal silhouette. Non weighted enteric tube tip and side hole overlie the gastric body. IMPRESSION: Enteric tube  tip and side hole overlie the gastric body. Emphysematous changes. Right basilar opacities are less conspicuous than prior exam. Electronically Signed   By: Primitivo Gauze M.D.   On: 10/28/2020 09:38   DG CHEST PORT 1 VIEW  Result Date: 10/25/2020 CLINICAL DATA:  Worsening shortness of breath today. Positive COVID test on 10/21/2020. Former smoker with COPD. EXAM: PORTABLE CHEST 1 VIEW COMPARISON:  10/22/2020 and older studies. FINDINGS: Cardiac silhouette is normal in size. No mediastinal or hilar masses. Bilateral interstitial thickening noted most evident at the bases, right greater than left, similar to the prior exam. No lung consolidation to suggest pneumonia. No pleural effusion or pneumothorax. Skeletal structures are grossly intact. IMPRESSION: 1. No convincing acute cardiopulmonary disease. 2. Bilateral interstitial thickening, greatest at the right lung base, consistent with fibrosis. Electronically Signed   By: Lajean Manes M.D.   On: 10/25/2020 14:01   Portable chest 1 View  Result Date: 10/22/2020 CLINICAL DATA:  Shortness of breath. EXAM: PORTABLE CHEST 1 VIEW COMPARISON:  10/21/2020.  11/24/2017. FINDINGS: Mediastinum hilar structures normal. Cardiomegaly with pulmonary venous congestion bilateral interstitial prominence. Small bilateral pleural effusions. Findings suggest CHF. Pneumonitis cannot be excluded. Associated bibasilar atelectasis and or infiltrates. Bibasilar pneumonia cannot be excluded. IMPRESSION: 1. Cardiomegaly with pulmonary venous congestion bilateral interstitial prominence. Small bilateral pleural effusions. Findings suggest CHF. 2. Associated bibasilar atelectasis and or infiltrates. Bibasilar pneumonia cannot be excluded. Electronically Signed   By: Marcello Moores  Register   On: 10/22/2020 05:11   DG Chest Portable 1 View  Result Date: 10/21/2020 CLINICAL DATA:  Hypoxia. EXAM: PORTABLE CHEST 1 VIEW COMPARISON:  November 24, 2017. FINDINGS: The heart size and  mediastinal contours are within normal limits. No pneumothorax or pleural effusion  is noted. Left lung is clear. Right midlung and basilar opacities are noted concerning for pneumonia. The visualized skeletal structures are unremarkable. IMPRESSION: Right midlung and basilar opacities are noted concerning for pneumonia. Electronically Signed   By: Marijo Conception M.D.   On: 10/21/2020 12:59    Orson Eva, DO  [facility]  If 7PM-7AM, please contact night-coverage [email or web address] Password [email or web address] 11/09/2020, 12:15 PM   LOS: 19 days

## 2020-11-10 DIAGNOSIS — U071 COVID-19: Secondary | ICD-10-CM | POA: Diagnosis not present

## 2020-11-10 DIAGNOSIS — J441 Chronic obstructive pulmonary disease with (acute) exacerbation: Secondary | ICD-10-CM | POA: Diagnosis not present

## 2020-11-10 DIAGNOSIS — J9621 Acute and chronic respiratory failure with hypoxia: Secondary | ICD-10-CM | POA: Diagnosis not present

## 2020-11-10 DIAGNOSIS — N184 Chronic kidney disease, stage 4 (severe): Secondary | ICD-10-CM | POA: Diagnosis not present

## 2020-11-10 LAB — COMPREHENSIVE METABOLIC PANEL
ALT: 57 U/L — ABNORMAL HIGH (ref 0–44)
AST: 19 U/L (ref 15–41)
Albumin: 2.6 g/dL — ABNORMAL LOW (ref 3.5–5.0)
Alkaline Phosphatase: 39 U/L (ref 38–126)
Anion gap: 10 (ref 5–15)
BUN: 82 mg/dL — ABNORMAL HIGH (ref 8–23)
CO2: 28 mmol/L (ref 22–32)
Calcium: 8.6 mg/dL — ABNORMAL LOW (ref 8.9–10.3)
Chloride: 104 mmol/L (ref 98–111)
Creatinine, Ser: 2.15 mg/dL — ABNORMAL HIGH (ref 0.61–1.24)
GFR, Estimated: 32 mL/min — ABNORMAL LOW (ref >60)
Glucose, Bld: 102 mg/dL — ABNORMAL HIGH (ref 70–99)
Potassium: 4.6 mmol/L (ref 3.5–5.1)
Sodium: 142 mmol/L (ref 135–145)
Total Bilirubin: 0.8 mg/dL (ref 0.3–1.2)
Total Protein: 6.1 g/dL — ABNORMAL LOW (ref 6.5–8.1)

## 2020-11-10 LAB — GLUCOSE, CAPILLARY
Glucose-Capillary: 110 mg/dL — ABNORMAL HIGH (ref 70–99)
Glucose-Capillary: 136 mg/dL — ABNORMAL HIGH (ref 70–99)
Glucose-Capillary: 152 mg/dL — ABNORMAL HIGH (ref 70–99)
Glucose-Capillary: 366 mg/dL — ABNORMAL HIGH (ref 70–99)

## 2020-11-10 LAB — MAGNESIUM: Magnesium: 2.2 mg/dL (ref 1.7–2.4)

## 2020-11-10 LAB — BRAIN NATRIURETIC PEPTIDE: B Natriuretic Peptide: 242 pg/mL — ABNORMAL HIGH (ref 0.0–100.0)

## 2020-11-10 LAB — PROCALCITONIN: Procalcitonin: <0.1 ng/mL

## 2020-11-10 MED ORDER — FUROSEMIDE 10 MG/ML IJ SOLN
40.0000 mg | Freq: Once | INTRAMUSCULAR | Status: AC
  Administered 2020-11-10: 40 mg via INTRAVENOUS
  Filled 2020-11-10: qty 4

## 2020-11-10 NOTE — Progress Notes (Signed)
PROGRESS NOTE  TRAESON DUSZA WNU:[id number] DOB: 1945/12/05 DOA: 10/21/2020 PCP: Rosita Fire, MD Brief History: DEYON CHIZEK a 75 y.o.malewith medical history significantfor longtimetobacco use, COPD(emphysema),GERD,stage IV CKD, chronic constipation, hypertension, paroxysmal atrial flutter who presented to the emergency department from PCP office with shortness of breath and hypoxia. He was noted to have a pulse ox of 80 to 81% on room air. Patient was admitted with acute hypoxemic respiratory failure secondary to Covid pneumonia and is unvaccinated.He was treated with remdesivir, steroids and baricitinib. Overall hypoxemia significantly improved. He developed significant encephalopathy, which may be related to COVID versus hospital delirium. Overall mental status appears to be improving at this time. CT head with no acute findings.He was also noted to be in atrial fibrillation and is currently rate controlled. Hospital course further complicated by development of significant anemia. He has elevated BUN and hemoglobin has drifted down since admission. He was transfused 1 unit of PRBC on 1/10. Concern for upper GI bleed. GI following and consideringEGDif any signs of active bleed  Assessment/Plan: Acute respiratory failure with hypoxia secondary to Covid pneumonia -patient is unvaccinated. He has underlying chronic lung disease and other co-morbidities. He remains HighRisk for adverse outcomeandseverelungparenchymal injury. He and his family initially refused the use of remdesivir;thenon 10/22/2020 accepted to be treated along with the use of steroids. Patient with good response to the initiation of barcitinib. Currently, he has completed his steroids, remdesivir and baricitinib course. -11/07/20--remains sob--started IV lasix -11/08/20--remains sob-->repeat CXR, check troponins, V/Q scan,  -V/Q intermediate, but low clinical suspicion of PE -PCT  0.11 -1/14--restart IV steroids -1/15--increase IV solumedrol to 80 mg IV bid -11/10/20--redose lasix IV 40  Acute encephalopathy-multifactorial. -Family reports that he may have some baseline cognitive deficits and early signs of dementia at baseline. CT head found to be negative. Suspect that worsening mental status was related to acute infectious process/metabolic issues. Limit sedating medications.  -Overall mental status appears to be improved  Atypical Chest pain -partly reproducible -personally reviewed EKG--sinus, nonspecific STT change -personally reviewed CXR--R-base atelectasis, increase interstitial marking -V/Q--indeterminant probability--clinically low suspicion of PE -troponins 10>>19  Bacteremia -contaminant -oneset blood culture positive for micrococcus likely contaminant. Repeat blood cultures negative. Rocephin was discontinued  Fluid overload -due to fluid resuscitation -lasix IV--continue, redose x 1 -11/07/20--personally reviewed CXR--increase interstitial markings; bilateral patchy opacities -Echo  Euthyroid sick syndrome Low TSH. Free T4 is normal. Repeat labs in 3 to 4 weeks as an outpatient  Chronic stage IV kidney disease  -Baseline creatinine approximately 2.2-2.5. -His creatinine is stable overall and will continue following trend. Continue minimizing nephrotoxic agents and avoiding hypotension. Patient has markedly elevated BUN. His FOBT is also positive. Concern for GI bleeding.  Blood Loss Anemia  -Noted to have FOBT positive and dark stools. BUN is markedly elevated. Aspirin and heparin have been discontinued. GI following and plans on possible EGDif any signs of active bleed -Hgb trending down from 12>>7 during this admission -continue PPI -Doubt hemolysis with normal bilirubin -multifactorial including CKDchronic disease, numerous blood draws, dilution -one unit PRBC transfused on 11/04/20 -discussed with Dr. Laural Golden  11/06/20  COPD with acute exacerbation -secondary to Covid infection treated with IV steroids and bronchodilators as mentioned above. Overall respiratory status hadstabilized -1/14--wheeze and sob-->restart IV steroids and start combivent -1/15--increase IV solumedrol to 80 mg IV bid  Paroxysmal atrial flutter -RVRimproved -He is on carvediloland diltiazem for rate control.Heart rate is currently stableChadsvasc score of 2  for hypertension and age. -Not a candidate for anticoagulation at this time due to concerns for GI bleeding. -start ASA 81 mg after d/c  Chronic constipation  - laxative therapy ordered. Follow response and further improve bowel management as required.  Transaminitis -reactive from current Covid infection. Currently stable. Continue to monitor.  Hypernatremia/hyperkalemia: -Improved after receiving hypotonic fluids and Lokelma. Continue to monitor  Goals of care. -Detailed discussion with the patient's daughter regarding his expected prognosis. Recommended DNR status and she is in agreement. She wishes to continue with current treatments, but if he were to develop cardiac/respiratory arrest, she would not want heroic measures       Status is: Inpatient  Remains inpatient appropriate because:IV treatments appropriate due to intensity of illness or inability to take PO   Dispo: The patient is from:Home Anticipated d/c is OA:[facility] Anticipated d/c date is: 1 day Patient currently is not medically stable to d/c.        Family Communication:daughter updated 1/16  Consultants:Palliative; GI  Code Status: DNR  DVT Prophylaxis: SCDs    Total time spent 35 minutes.  Greater than 50% spent face to face counseling and coordinating care.    Subjective: Patient feels like he is breathing better.  Denies f/c, cp, n/v/d, abd pain.  Still coughing, no  hemoptysis  Objective: Vitals:   11/10/20 0649 11/10/20 0651 11/10/20 0804 11/10/20 1305  BP: (!) 149/132 116/67  (!) 88/52  Pulse: 64   65  Resp: 20   18  Temp: 97.6 F (36.4 C)   (!) 97.5 F (36.4 C)  TempSrc: Oral   Oral  SpO2: 91%  (!) 85% 97%  Weight: 97.2 kg     Height:        Intake/Output Summary (Last 24 hours) at 11/10/2020 1435 Last data filed at 11/09/2020 1700 Gross per 24 hour  Intake 240 ml  Output 350 ml  Net -110 ml   Weight change:  Exam:   General:  Pt is alert, follows commands appropriately, not in acute distress  HEENT: No icterus, No thrush, No neck mass, [place]/AT  Cardiovascular: RRR, S1/S2, no rubs, no gallops  Respiratory: bilateral rales.  Upper airway wheeze  Abdomen: Soft/+BS, non tender, non distended, no guarding  Extremities: 1+ LE edema, No lymphangitis, No petechiae, No rashes, no synovitis   Data Reviewed: I have personally reviewed following labs and imaging studies Basic Metabolic Panel: Recent Labs  Lab 11/06/20 0646 11/07/20 0631 11/08/20 0437 11/09/20 0654 11/10/20 0932  NA 141 143 145 142 142  K 4.1 4.0 4.4 4.4 4.6  CL 108 109 107 105 104  CO2 26 28 29 28 28   GLUCOSE 99 118* 104* 116* 102*  BUN 74* 61* 57* 64* 82*  CREATININE 2.21* 2.01* 2.10* 2.08* 2.15*  CALCIUM 7.8* 8.1* 8.3* 8.2* 8.6*  MG  --  2.2 2.3  --  2.2   Liver Function Tests: Recent Labs  Lab 11/06/20 0646 11/07/20 0631 11/10/20 0932  AST 30 29 19   ALT 87* 79* 57*  ALKPHOS 31* 35* 39  BILITOT 0.8 0.9 0.8  PROT 5.3* 5.4* 6.1*  ALBUMIN 2.4* 2.5* 2.6*   No results for input(s): LIPASE, AMYLASE in the last 168 hours. No results for input(s): AMMONIA in the last 168 hours. Coagulation Profile: No results for input(s): INR, PROTIME in the last 168 hours. CBC: Recent Labs  Lab 11/06/20 0646 11/07/20 0631 11/07/20 1011 11/08/20 1055 11/09/20 0654  WBC 8.3 10.3 11.5* 8.1 8.4  HGB  7.8* 8.2* 8.1* 7.6* 7.7*  HCT 25.7* 27.4* 25.8* 25.2* 25.9*   MCV 100.8* 102.6* 102.0* 103.7* 102.8*  PLT 210 222 228 200 217   Cardiac Enzymes: No results for input(s): CKTOTAL, CKMB, CKMBINDEX, TROPONINI in the last 168 hours. BNP: Invalid input(s): POCBNP CBG: Recent Labs  Lab 11/09/20 1615 11/09/20 2004 11/10/20 0033 11/10/20 0310 11/10/20 0745  GLUCAP 143* 124* 366* 136* 110*   HbA1C: No results for input(s): HGBA1C in the last 72 hours. Urine analysis:    Component Value Date/Time   COLORURINE YELLOW 10/25/2020 1214   APPEARANCEUR HAZY (A) 10/25/2020 1214   LABSPEC 1.015 10/25/2020 1214   PHURINE 5.0 10/25/2020 1214   GLUCOSEU NEGATIVE 10/25/2020 1214   HGBUR MODERATE (A) 10/25/2020 1214   BILIRUBINUR NEGATIVE 10/25/2020 1214   KETONESUR NEGATIVE 10/25/2020 1214   PROTEINUR 30 (A) 10/25/2020 1214   UROBILINOGEN 0.2 11/10/2014 0400   NITRITE NEGATIVE 10/25/2020 1214   LEUKOCYTESUR NEGATIVE 10/25/2020 1214   Sepsis Labs: @LABRCNTIP (procalcitonin:4,lacticidven:4) )No results found for this or any previous visit (from the past 240 hour(s)).   Scheduled Meds: . (feeding supplement) PROSource Plus  30 mL Oral BID BM  . vitamin C  500 mg Oral Daily  . carvedilol  6.25 mg Per Tube BID WC  . Chlorhexidine Gluconate Cloth  6 each Topical Daily  . diltiazem  60 mg Oral Q8H  . feeding supplement  237 mL Oral TID BM  . folic acid  1 mg Oral Daily  . furosemide  20 mg Intravenous Daily  . furosemide  40 mg Intravenous Once  . Ipratropium-Albuterol  2 puff Inhalation TID  . methylPREDNISolone (SOLU-MEDROL) injection  80 mg Intravenous Q12H  . pantoprazole (PROTONIX) IV  40 mg Intravenous Q12H  . vitamin B-12  500 mcg Oral Daily  . zinc sulfate  220 mg Oral Daily   Continuous Infusions: . dextrose Stopped (11/02/20 0444)    Procedures/Studies: CT HEAD WO CONTRAST  Result Date: 10/29/2020 CLINICAL DATA:  Mental status change, unknown cause. Altered mental status, COVID positive. EXAM: CT HEAD WITHOUT CONTRAST TECHNIQUE:  Contiguous axial images were obtained from the base of the skull through the vertex without intravenous contrast. COMPARISON:  No pertinent prior exams available for comparison. FINDINGS: Brain: Mildly motion degraded exam. Cerebral volume is normal for age. There is no acute intracranial hemorrhage. No demarcated cortical infarct. No extra-axial fluid collection. No evidence of intracranial mass. No midline shift. Vascular: No hyperdense vessel. Atherosclerotic calcifications Skull: Normal. Negative for fracture or focal lesion. Sinuses/Orbits: Visualized orbits show no acute finding. Scattered secretions within the left ethmoid air cells. Moderate mucosal thickening within the right maxillary sinus with associated chronic reactive osteitis. There is full-thickness erosion of the anterior right maxillary sinus wall inferiorly (for instance as seen on series 3, image 3)(series 5, image 23). Other: Partially visualized nasoenteric tube. IMPRESSION: Mildly motion degraded exam. No evidence of acute intracranial abnormality. Moderate mucosal thickening within the right maxillary sinus with associated chronic reactive osteitis. There is focal full-thickness erosion of the anterior right maxillary sinus wall inferiorly. Mild left ethmoid sinusitis. Electronically Signed   By: Kellie Simmering DO   On: 10/29/2020 11:43   NM Pulmonary Perfusion  Result Date: 11/08/2020 CLINICAL DATA:  Shortness of breath, hypoxia, COVID-19 positive EXAM: NUCLEAR MEDICINE PERFUSION LUNG SCAN TECHNIQUE: Perfusion images were obtained in multiple projections after intravenous injection of radiopharmaceutical. Ventilation scans intentionally deferred if perfusion scan and chest x-ray adequate for interpretation during COVID 19 epidemic. RADIOPHARMACEUTICALS:  4.4 mCi Tc-52m MAA IV COMPARISON:  Chest x-ray 11/08/2020 FINDINGS: Heterogeneous accumulation of radiotracer likely secondary to underlying COPD. There are 2 left-sided and at least 1  right-sided small to moderate sized segmental perfusion defects. No large segmental perfusion defect. IMPRESSION: 1. Intermediate probability for pulmonary embolism. 2. COPD. Electronically Signed   By: Davina Poke D.O.   On: 11/08/2020 14:31   DG CHEST PORT 1 VIEW  Result Date: 11/08/2020 CLINICAL DATA:  Shortness of breath, confusion this morning, COVID-19 positive, history atrial fibrillation, hypertension, COPD EXAM: PORTABLE CHEST 1 VIEW COMPARISON:  Portable exam 1037 hours compared to 11/07/2020 FINDINGS: Normal heart size, mediastinal contours, and pulmonary vascularity. Patchy infiltrate identified at RIGHT lung base consistent with pneumonia unchanged. Underlying emphysematous changes and minimal central peribronchial thickening as well as chronic interstitial prominence. No pleural effusion or pneumothorax. Atherosclerotic calcification aorta. Bones demineralized. IMPRESSION: COPD changes with persistent RIGHT basilar infiltrate consistent with pneumonia. Aortic Atherosclerosis (ICD10-I70.0) and Emphysema (ICD10-J43.9). Electronically Signed   By: Lavonia Dana M.D.   On: 11/08/2020 10:50   DG CHEST PORT 1 VIEW  Result Date: 11/07/2020 CLINICAL DATA:  Shortness of breath. The patient tested positive for COVID-19 10/21/2020. EXAM: PORTABLE CHEST 1 VIEW COMPARISON:  Single-view of the chest 10/28/2020, 10/25/2020 one 11/24/2017. PA and lateral chest 12/10/2016. FINDINGS: The lungs are emphysematous. Mild, patchy airspace disease in the right mid and lower lung zones appears mildly improved compared to the most recent study. Heart size is normal. Aortic atherosclerosis. IMPRESSION: Mild improvement in patchy airspace disease in the right lower lung zone compatible with resolving pneumonia. Aortic Atherosclerosis (ICD10-I70.0) and Emphysema (ICD10-J43.9). Electronically Signed   By: Inge Rise M.D.   On: 11/07/2020 12:06   DG CHEST PORT 1 VIEW  Result Date: 10/28/2020 CLINICAL DATA:  Post  NG tube placement. EXAM: PORTABLE CHEST 1 VIEW COMPARISON:  10/25/2020 and prior. FINDINGS: Diffuse interstitial prominence. No pneumothorax or pleural effusion. Decreased conspicuity of patchy right basilar opacities. Stable cardiomediastinal silhouette. Non weighted enteric tube tip and side hole overlie the gastric body. IMPRESSION: Enteric tube tip and side hole overlie the gastric body. Emphysematous changes. Right basilar opacities are less conspicuous than prior exam. Electronically Signed   By: Primitivo Gauze M.D.   On: 10/28/2020 09:38   DG CHEST PORT 1 VIEW  Result Date: 10/25/2020 CLINICAL DATA:  Worsening shortness of breath today. Positive COVID test on 10/21/2020. Former smoker with COPD. EXAM: PORTABLE CHEST 1 VIEW COMPARISON:  10/22/2020 and older studies. FINDINGS: Cardiac silhouette is normal in size. No mediastinal or hilar masses. Bilateral interstitial thickening noted most evident at the bases, right greater than left, similar to the prior exam. No lung consolidation to suggest pneumonia. No pleural effusion or pneumothorax. Skeletal structures are grossly intact. IMPRESSION: 1. No convincing acute cardiopulmonary disease. 2. Bilateral interstitial thickening, greatest at the right lung base, consistent with fibrosis. Electronically Signed   By: Lajean Manes M.D.   On: 10/25/2020 14:01   Portable chest 1 View  Result Date: 10/22/2020 CLINICAL DATA:  Shortness of breath. EXAM: PORTABLE CHEST 1 VIEW COMPARISON:  10/21/2020.  11/24/2017. FINDINGS: Mediastinum hilar structures normal. Cardiomegaly with pulmonary venous congestion bilateral interstitial prominence. Small bilateral pleural effusions. Findings suggest CHF. Pneumonitis cannot be excluded. Associated bibasilar atelectasis and or infiltrates. Bibasilar pneumonia cannot be excluded. IMPRESSION: 1. Cardiomegaly with pulmonary venous congestion bilateral interstitial prominence. Small bilateral pleural effusions. Findings  suggest CHF. 2. Associated bibasilar atelectasis and or infiltrates. Bibasilar pneumonia cannot be excluded.  Electronically Signed   By: Marcello Moores  Register   On: 10/22/2020 05:11   DG Chest Portable 1 View  Result Date: 10/21/2020 CLINICAL DATA:  Hypoxia. EXAM: PORTABLE CHEST 1 VIEW COMPARISON:  November 24, 2017. FINDINGS: The heart size and mediastinal contours are within normal limits. No pneumothorax or pleural effusion is noted. Left lung is clear. Right midlung and basilar opacities are noted concerning for pneumonia. The visualized skeletal structures are unremarkable. IMPRESSION: Right midlung and basilar opacities are noted concerning for pneumonia. Electronically Signed   By: Marijo Conception M.D.   On: 10/21/2020 12:59    Orson Eva, DO  [facility]  If 7PM-7AM, please contact night-coverage [email or web address] Password [email or web address] 11/10/2020, 2:35 PM   LOS: 20 days

## 2020-11-11 ENCOUNTER — Inpatient Hospital Stay (HOSPITAL_COMMUNITY): Payer: Medicare Other

## 2020-11-11 DIAGNOSIS — J9601 Acute respiratory failure with hypoxia: Secondary | ICD-10-CM

## 2020-11-11 LAB — GLUCOSE, CAPILLARY
Glucose-Capillary: 116 mg/dL — ABNORMAL HIGH (ref 70–99)
Glucose-Capillary: 122 mg/dL — ABNORMAL HIGH (ref 70–99)
Glucose-Capillary: 124 mg/dL — ABNORMAL HIGH (ref 70–99)
Glucose-Capillary: 125 mg/dL — ABNORMAL HIGH (ref 70–99)
Glucose-Capillary: 127 mg/dL — ABNORMAL HIGH (ref 70–99)
Glucose-Capillary: 141 mg/dL — ABNORMAL HIGH (ref 70–99)
Glucose-Capillary: 143 mg/dL — ABNORMAL HIGH (ref 70–99)

## 2020-11-11 LAB — COMPREHENSIVE METABOLIC PANEL
ALT: 52 U/L — ABNORMAL HIGH (ref 0–44)
AST: 18 U/L (ref 15–41)
Albumin: 2.6 g/dL — ABNORMAL LOW (ref 3.5–5.0)
Alkaline Phosphatase: 40 U/L (ref 38–126)
Anion gap: 12 (ref 5–15)
BUN: 87 mg/dL — ABNORMAL HIGH (ref 8–23)
CO2: 29 mmol/L (ref 22–32)
Calcium: 8.4 mg/dL — ABNORMAL LOW (ref 8.9–10.3)
Chloride: 101 mmol/L (ref 98–111)
Creatinine, Ser: 2.29 mg/dL — ABNORMAL HIGH (ref 0.61–1.24)
GFR, Estimated: 30 mL/min — ABNORMAL LOW (ref >60)
Glucose, Bld: 134 mg/dL — ABNORMAL HIGH (ref 70–99)
Potassium: 4.6 mmol/L (ref 3.5–5.1)
Sodium: 142 mmol/L (ref 135–145)
Total Bilirubin: 1.1 mg/dL (ref 0.3–1.2)
Total Protein: 6.1 g/dL — ABNORMAL LOW (ref 6.5–8.1)

## 2020-11-11 LAB — ECHOCARDIOGRAM COMPLETE
Area-P 1/2: 3.12 cm2
Height: 67 in
S' Lateral: 3.3 cm
Weight: 3428.59 oz

## 2020-11-11 LAB — MAGNESIUM: Magnesium: 2.2 mg/dL (ref 1.7–2.4)

## 2020-11-11 MED ORDER — PERFLUTREN LIPID MICROSPHERE
1.0000 mL | INTRAVENOUS | Status: AC | PRN
  Administered 2020-11-11: 2 mL via INTRAVENOUS

## 2020-11-11 MED ORDER — FOLIC ACID 1 MG PO TABS: 1.0000 mg | ORAL_TABLET | Freq: Every day | ORAL | Status: AC

## 2020-11-11 MED ORDER — PROSOURCE PLUS PO LIQD: 30.0000 mL | Freq: Two times a day (BID) | ORAL | Status: AC

## 2020-11-11 MED ORDER — PANTOPRAZOLE SODIUM 40 MG PO TBEC
40.0000 mg | DELAYED_RELEASE_TABLET | Freq: Two times a day (BID) | ORAL | Status: DC
  Administered 2020-11-11 – 2020-11-12 (×2): 40 mg via ORAL
  Filled 2020-11-11 (×2): qty 1

## 2020-11-11 MED ORDER — CYANOCOBALAMIN 500 MCG PO TABS: 500.0000 ug | ORAL_TABLET | Freq: Every day | ORAL | Status: AC

## 2020-11-11 MED ORDER — FUROSEMIDE 10 MG/ML IJ SOLN
20.0000 mg | Freq: Once | INTRAMUSCULAR | Status: AC
  Administered 2020-11-11: 20 mg via INTRAVENOUS
  Filled 2020-11-11: qty 2

## 2020-11-11 MED ORDER — ENSURE ENLIVE PO LIQD: 237.0000 mL | Freq: Three times a day (TID) | ORAL | 12 refills | Status: AC

## 2020-11-11 NOTE — Progress Notes (Signed)
PROGRESS NOTE  RONDA KAZMI ASN:[id number] DOB: January 11, 1946 DOA: 10/21/2020 PCP: Rosita Fire, MD  Brief History: CONOR LATA a 75 y.o.malewith medical history significantfor longtimetobacco use, COPD(emphysema),GERD,stage IV CKD, chronic constipation, hypertension, paroxysmal atrial flutter who presented to the emergency department from PCP office with shortness of breath and hypoxia. He was noted to have a pulse ox of 80 to 81% on room air. Patient was admitted with acute hypoxemic respiratory failure secondary to Covid pneumonia and is unvaccinated.He was treated with remdesivir, steroids and baricitinib. Overall hypoxemia significantly improved. He developed significant encephalopathy, which may be related to COVID versus hospital delirium. Overall mental status appears to be improving at this time. CT head with no acute findings.He was also noted to be in atrial fibrillation and is currently rate controlled. Hospital course further complicated by development of significant anemia. He has elevated BUN and hemoglobin has drifted down since admission. He was transfused 1 unit of PRBC on 1/10. Concern for upper GI bleed. GI following and consideringEGDif any signs of active bleed  Assessment/Plan: Acute respiratory failure with hypoxia secondary to Covid pneumonia -patient is unvaccinated. He has underlying chronic lung disease and other co-morbidities. He remains HighRisk for adverse outcomeandseverelungparenchymal injury. He and his family initially refused the use of remdesivir;thenon 10/22/2020 accepted to be treated along with the use of steroids. Patient with good response to the initiation of barcitinib. Currently, he has completed his steroids, remdesivir and baricitinib course. -11/07/20--remains sob--started IV lasix -11/08/20--remains sob-->repeat CXR, check troponins, V/Q scan, -V/Q intermediate, but low clinical suspicion of PE -PCT  0.11 -1/14--restart IV steroids -1/15--increase IV solumedrol to 80 mg IV bid -11/10/20--redose lasix IV 40 and continue daily IV dosing   Acute encephalopathy-multifactorial. -Family reports that he may have some baseline cognitive deficits and early signs of dementia at baseline. CT head found to be negative. Suspect that worsening mental status was related to acute infectious process/metabolic issues. Limit sedating medications.  -Overall mental status appears to be improved  Atypical Chest pain -partly reproducible -personally reviewed EKG--sinus, nonspecific STT change -personally reviewed CXR--R-base atelectasis, increase interstitial marking -V/Q--indeterminant probability--clinically low suspicion of PE -troponins 10>>19  Bacteremia -contaminant -oneset blood culture positive for micrococcus likely contaminant. Repeat blood cultures negative. Rocephin was discontinued  Fluid overload -due to fluid resuscitation -lasix IV--continue, redose x 1 -11/07/20--personally reviewed CXR--increase interstitial markings; bilateral patchy opacities -Echo  Euthyroid sick syndrome Low TSH. Free T4 is normal. Repeat labs in 3 to 4 weeks as an outpatient  Chronic stage IV kidney disease  -Baseline creatinine approximately 2.2-2.5. -His creatinine is stable overall and will continue following trend. Continue minimizing nephrotoxic agents and avoiding hypotension. Patient has markedly elevated BUN. His FOBT is also positive. Concern for GI bleeding.  Blood Loss Anemia  -Noted to have FOBT positive and dark stools. BUN is markedly elevated. Aspirin and heparin have been discontinued. GI following and plans on possible EGDif any signs of active bleed -Hgb trending down from 12>>7 during this admission -continue PPI -Doubt hemolysis with normal bilirubin -multifactorial including CKDchronic disease, numerous blood draws, dilution -one unit PRBC transfused on  11/04/20 -discussed with Dr. Laural Golden 11/06/20  COPD with acute exacerbation -secondary to Covid infection treated with IV steroids and bronchodilators as mentioned above. Overall respiratory status hadstabilized -1/14--wheeze and sob-->restart IV steroids and start combivent -1/15--increase IV solumedrol to 80 mg IV bid  Paroxysmal atrial flutter -RVRimproved -He is on carvediloland diltiazem for rate control.Heart rate  is currently stableChadsvasc score of 2 for hypertension and age. -Not a candidate for anticoagulation at this time due to concerns for GI bleeding. -start ASA 81 mg after d/c  Chronic constipation  - laxative therapy ordered. Follow response and further improve bowel management as required.  Transaminitis -reactive from current Covid infection. Currently stable. Continue to monitor.  Hypernatremia/hyperkalemia: -Improved after receiving hypotonic fluids and Lokelma. Continue to monitor  Goals of care. -Detailed discussion with the patient's daughter regarding his expected prognosis. Recommended DNR status and she is in agreement. She wishes to continue with current treatments, but if he were to develop cardiac/respiratory arrest, she would not want heroic measures       Status is: Inpatient  Remains inpatient appropriate because:IV treatments appropriate due to intensity of illness or inability to take PO   Dispo: The patient is from:Home Anticipated d/c is KL:[id number] Anticipated d/c date is: 1 day Patient currently is not medically stable to d/c.        Family Communication:daughter updated 1/17  Consultants:Palliative; GI  Code Status: DNR  DVT Prophylaxis: SCDs   Subjective: Patient denies fevers, chills, headache, chest pain, dyspnea, nausea, vomiting, diarrhea, abdominal pain, dysuria,    Objective: Vitals:   11/10/20 1942 11/11/20 0400 11/11/20  0736 11/11/20 1000  BP: 102/70 95/66  114/73  Pulse: 72 (!) 101  68  Resp: 20 20    Temp: 98 F (36.7 C) 98.3 F (36.8 C)    TempSrc: Axillary     SpO2: 90% 96% 96%   Weight:      Height:        Intake/Output Summary (Last 24 hours) at 11/11/2020 1326 Last data filed at 11/10/2020 2125 Gross per 24 hour  Intake 240 ml  Output 700 ml  Net -460 ml   Weight change:  Exam:   General:  Pt is alert, follows commands appropriately, not in acute distress  HEENT: No icterus, No thrush, No neck mass, [place]/AT  Cardiovascular: RRR, S1/S2, no rubs, no gallops  Respiratory: bibasilar rales. No wheeze  Abdomen: Soft/+BS, non tender, non distended, no guarding  Extremities: 1 + LE edema, No lymphangitis, No petechiae, No rashes, no synovitis   Data Reviewed: I have personally reviewed following labs and imaging studies Basic Metabolic Panel: Recent Labs  Lab 11/07/20 0631 11/08/20 0437 11/09/20 0654 11/10/20 0932 11/11/20 0410  NA 143 145 142 142 142  K 4.0 4.4 4.4 4.6 4.6  CL 109 107 105 104 101  CO2 28 29 28 28 29   GLUCOSE 118* 104* 116* 102* 134*  BUN 61* 57* 64* 82* 87*  CREATININE 2.01* 2.10* 2.08* 2.15* 2.29*  CALCIUM 8.1* 8.3* 8.2* 8.6* 8.4*  MG 2.2 2.3  --  2.2 2.2   Liver Function Tests: Recent Labs  Lab 11/06/20 0646 11/07/20 0631 11/10/20 0932 11/11/20 0410  AST 30 29 19 18   ALT 87* 79* 57* 52*  ALKPHOS 31* 35* 39 40  BILITOT 0.8 0.9 0.8 1.1  PROT 5.3* 5.4* 6.1* 6.1*  ALBUMIN 2.4* 2.5* 2.6* 2.6*   No results for input(s): LIPASE, AMYLASE in the last 168 hours. No results for input(s): AMMONIA in the last 168 hours. Coagulation Profile: No results for input(s): INR, PROTIME in the last 168 hours. CBC: Recent Labs  Lab 11/06/20 0646 11/07/20 0631 11/07/20 1011 11/08/20 1055 11/09/20 0654  WBC 8.3 10.3 11.5* 8.1 8.4  HGB 7.8* 8.2* 8.1* 7.6* 7.7*  HCT 25.7* 27.4* 25.8* 25.2* 25.9*  MCV 100.8* 102.6* 102.0*  103.7* 102.8*  PLT 210 222 228 200  217   Cardiac Enzymes: No results for input(s): CKTOTAL, CKMB, CKMBINDEX, TROPONINI in the last 168 hours. BNP: Invalid input(s): POCBNP CBG: Recent Labs  Lab 11/10/20 1948 11/11/20 0020 11/11/20 0516 11/11/20 0746 11/11/20 1237  GLUCAP 152* 124* 122* 125* 116*   HbA1C: No results for input(s): HGBA1C in the last 72 hours. Urine analysis:    Component Value Date/Time   COLORURINE YELLOW 10/25/2020 1214   APPEARANCEUR HAZY (A) 10/25/2020 1214   LABSPEC 1.015 10/25/2020 1214   PHURINE 5.0 10/25/2020 1214   GLUCOSEU NEGATIVE 10/25/2020 1214   HGBUR MODERATE (A) 10/25/2020 1214   BILIRUBINUR NEGATIVE 10/25/2020 1214   KETONESUR NEGATIVE 10/25/2020 1214   PROTEINUR 30 (A) 10/25/2020 1214   UROBILINOGEN 0.2 11/10/2014 0400   NITRITE NEGATIVE 10/25/2020 1214   LEUKOCYTESUR NEGATIVE 10/25/2020 1214   Sepsis Labs: @LABRCNTIP (procalcitonin:4,lacticidven:4) )No results found for this or any previous visit (from the past 240 hour(s)).   Scheduled Meds: . (feeding supplement) PROSource Plus  30 mL Oral BID BM  . vitamin C  500 mg Oral Daily  . carvedilol  6.25 mg Per Tube BID WC  . Chlorhexidine Gluconate Cloth  6 each Topical Daily  . diltiazem  60 mg Oral Q8H  . feeding supplement  237 mL Oral TID BM  . folic acid  1 mg Oral Daily  . furosemide  20 mg Intravenous Daily  . furosemide  20 mg Intravenous Once  . Ipratropium-Albuterol  2 puff Inhalation TID  . methylPREDNISolone (SOLU-MEDROL) injection  80 mg Intravenous Q12H  . pantoprazole  40 mg Oral BID  . vitamin B-12  500 mcg Oral Daily  . zinc sulfate  220 mg Oral Daily   Continuous Infusions: . dextrose Stopped (11/02/20 0444)    Procedures/Studies: CT HEAD WO CONTRAST  Result Date: 10/29/2020 CLINICAL DATA:  Mental status change, unknown cause. Altered mental status, COVID positive. EXAM: CT HEAD WITHOUT CONTRAST TECHNIQUE: Contiguous axial images were obtained from the base of the skull through the vertex  without intravenous contrast. COMPARISON:  No pertinent prior exams available for comparison. FINDINGS: Brain: Mildly motion degraded exam. Cerebral volume is normal for age. There is no acute intracranial hemorrhage. No demarcated cortical infarct. No extra-axial fluid collection. No evidence of intracranial mass. No midline shift. Vascular: No hyperdense vessel. Atherosclerotic calcifications Skull: Normal. Negative for fracture or focal lesion. Sinuses/Orbits: Visualized orbits show no acute finding. Scattered secretions within the left ethmoid air cells. Moderate mucosal thickening within the right maxillary sinus with associated chronic reactive osteitis. There is full-thickness erosion of the anterior right maxillary sinus wall inferiorly (for instance as seen on series 3, image 3)(series 5, image 23). Other: Partially visualized nasoenteric tube. IMPRESSION: Mildly motion degraded exam. No evidence of acute intracranial abnormality. Moderate mucosal thickening within the right maxillary sinus with associated chronic reactive osteitis. There is focal full-thickness erosion of the anterior right maxillary sinus wall inferiorly. Mild left ethmoid sinusitis. Electronically Signed   By: Kellie Simmering DO   On: 10/29/2020 11:43   NM Pulmonary Perfusion  Result Date: 11/08/2020 CLINICAL DATA:  Shortness of breath, hypoxia, COVID-19 positive EXAM: NUCLEAR MEDICINE PERFUSION LUNG SCAN TECHNIQUE: Perfusion images were obtained in multiple projections after intravenous injection of radiopharmaceutical. Ventilation scans intentionally deferred if perfusion scan and chest x-ray adequate for interpretation during COVID 19 epidemic. RADIOPHARMACEUTICALS:  4.4 mCi Tc-80m MAA IV COMPARISON:  Chest x-ray 11/08/2020 FINDINGS: Heterogeneous accumulation of radiotracer likely secondary to  underlying COPD. There are 2 left-sided and at least 1 right-sided small to moderate sized segmental perfusion defects. No large segmental  perfusion defect. IMPRESSION: 1. Intermediate probability for pulmonary embolism. 2. COPD. Electronically Signed   By: Davina Poke D.O.   On: 11/08/2020 14:31   DG CHEST PORT 1 VIEW  Result Date: 11/08/2020 CLINICAL DATA:  Shortness of breath, confusion this morning, COVID-19 positive, history atrial fibrillation, hypertension, COPD EXAM: PORTABLE CHEST 1 VIEW COMPARISON:  Portable exam 1037 hours compared to 11/07/2020 FINDINGS: Normal heart size, mediastinal contours, and pulmonary vascularity. Patchy infiltrate identified at RIGHT lung base consistent with pneumonia unchanged. Underlying emphysematous changes and minimal central peribronchial thickening as well as chronic interstitial prominence. No pleural effusion or pneumothorax. Atherosclerotic calcification aorta. Bones demineralized. IMPRESSION: COPD changes with persistent RIGHT basilar infiltrate consistent with pneumonia. Aortic Atherosclerosis (ICD10-I70.0) and Emphysema (ICD10-J43.9). Electronically Signed   By: Lavonia Dana M.D.   On: 11/08/2020 10:50   DG CHEST PORT 1 VIEW  Result Date: 11/07/2020 CLINICAL DATA:  Shortness of breath. The patient tested positive for COVID-19 10/21/2020. EXAM: PORTABLE CHEST 1 VIEW COMPARISON:  Single-view of the chest 10/28/2020, 10/25/2020 one 11/24/2017. PA and lateral chest 12/10/2016. FINDINGS: The lungs are emphysematous. Mild, patchy airspace disease in the right mid and lower lung zones appears mildly improved compared to the most recent study. Heart size is normal. Aortic atherosclerosis. IMPRESSION: Mild improvement in patchy airspace disease in the right lower lung zone compatible with resolving pneumonia. Aortic Atherosclerosis (ICD10-I70.0) and Emphysema (ICD10-J43.9). Electronically Signed   By: Inge Rise M.D.   On: 11/07/2020 12:06   DG CHEST PORT 1 VIEW  Result Date: 10/28/2020 CLINICAL DATA:  Post NG tube placement. EXAM: PORTABLE CHEST 1 VIEW COMPARISON:  10/25/2020 and prior.  FINDINGS: Diffuse interstitial prominence. No pneumothorax or pleural effusion. Decreased conspicuity of patchy right basilar opacities. Stable cardiomediastinal silhouette. Non weighted enteric tube tip and side hole overlie the gastric body. IMPRESSION: Enteric tube tip and side hole overlie the gastric body. Emphysematous changes. Right basilar opacities are less conspicuous than prior exam. Electronically Signed   By: Primitivo Gauze M.D.   On: 10/28/2020 09:38   DG CHEST PORT 1 VIEW  Result Date: 10/25/2020 CLINICAL DATA:  Worsening shortness of breath today. Positive COVID test on 10/21/2020. Former smoker with COPD. EXAM: PORTABLE CHEST 1 VIEW COMPARISON:  10/22/2020 and older studies. FINDINGS: Cardiac silhouette is normal in size. No mediastinal or hilar masses. Bilateral interstitial thickening noted most evident at the bases, right greater than left, similar to the prior exam. No lung consolidation to suggest pneumonia. No pleural effusion or pneumothorax. Skeletal structures are grossly intact. IMPRESSION: 1. No convincing acute cardiopulmonary disease. 2. Bilateral interstitial thickening, greatest at the right lung base, consistent with fibrosis. Electronically Signed   By: Lajean Manes M.D.   On: 10/25/2020 14:01   Portable chest 1 View  Result Date: 10/22/2020 CLINICAL DATA:  Shortness of breath. EXAM: PORTABLE CHEST 1 VIEW COMPARISON:  10/21/2020.  11/24/2017. FINDINGS: Mediastinum hilar structures normal. Cardiomegaly with pulmonary venous congestion bilateral interstitial prominence. Small bilateral pleural effusions. Findings suggest CHF. Pneumonitis cannot be excluded. Associated bibasilar atelectasis and or infiltrates. Bibasilar pneumonia cannot be excluded. IMPRESSION: 1. Cardiomegaly with pulmonary venous congestion bilateral interstitial prominence. Small bilateral pleural effusions. Findings suggest CHF. 2. Associated bibasilar atelectasis and or infiltrates. Bibasilar  pneumonia cannot be excluded. Electronically Signed   By: [place]   On: 10/22/2020 05:11   DG Chest Portable  1 View  Result Date: 10/21/2020 CLINICAL DATA:  Hypoxia. EXAM: PORTABLE CHEST 1 VIEW COMPARISON:  November 24, 2017. FINDINGS: The heart size and mediastinal contours are within normal limits. No pneumothorax or pleural effusion is noted. Left lung is clear. Right midlung and basilar opacities are noted concerning for pneumonia. The visualized skeletal structures are unremarkable. IMPRESSION: Right midlung and basilar opacities are noted concerning for pneumonia. Electronically Signed   By: Marijo Conception M.D.   On: 10/21/2020 12:59    Orson Eva, DO  [facility]  If 7PM-7AM, please contact night-coverage [email or web address] Password [email or web address] 11/11/2020, 1:26 PM   LOS: 21 days

## 2020-11-11 NOTE — Progress Notes (Signed)
Physical Therapy Treatment Patient Details Name: Darren Burke MRN: [id number] DOB: 1946-01-12 Today's Date: 11/11/2020    History of Present Illness Darren Burke is a 75 y.o. male with medical history significant for longtime tobacco use, COPD (emphysema), GERD, stage IV CKD, chronic constipation, hypertension, paroxysmal atrial flutter who presented to the emergency department from PCP office with shortness of breath and hypoxia.  He was noted to have a pulse ox of 80 to 81% on room air.  He has been placed on 4 L nasal cannula with improvement of oxygen saturation to 87%.  He does not wear home oxygen.  He has had progressive shortness of breath for the past 2 days.  He feels unwell overall.  He says that he ran out of his albuterol inhaler 4 days ago.  His symptoms have been persistent and progressively getting worse.  His shortness of breath is much worse when ambulating or moving.  He denies chest pain.  He reports a nonproductive cough.  He lives home alone.  He is unvaccinated for COVID-19.    PT Comments    Patient demonstrates increased BLE strength for completing sit to stands and taking steps at bedside without having to shuffle feet, but limited mostly due to fatigue/poor standing balance.  Patient demonstrates fair return for completing BLE ROM/strengthening exercises while seated at bedside requiring repeated verbal cueing and demonstration with fair carryover.  Patient tolerated sitting up in chair after therapy - RN/NT notified.  Patient will benefit from continued physical therapy in hospital and recommended venue below to increase strength, balance, endurance for safe ADLs and gait.   Follow Up Recommendations  SNF     Equipment Recommendations       Recommendations for Other Services       Precautions / Restrictions Precautions Precautions: Fall Restrictions Weight Bearing Restrictions: No    Mobility  Bed Mobility Overal bed mobility: Needs Assistance Bed  Mobility: Supine to Sit     Supine to sit: Mod assist     General bed mobility comments: increased time, labored movement, had most difficulty scooting forward  Transfers Overall transfer level: Needs assistance Equipment used: Rolling walker (2 wheeled) Transfers: Sit to/from [facility] Sit to Stand: Mod assist Stand pivot transfers: Mod assist       General transfer comment: demonstrates increased BLE strength for completing sit to stands and transfers  Ambulation/Gait Ambulation/Gait assistance: Mod assist;Max assist Gait Distance (Feet): 5 Feet Assistive device: Rolling walker (2 wheeled)   Gait velocity: slow   General Gait Details: limited to 4-5 slow labored side steps due to fatigue and generalized weakness   Stairs             Wheelchair Mobility    Modified Rankin (Stroke Patients Only)       Balance Overall balance assessment: Needs assistance Sitting-balance support: Feet supported;No upper extremity supported Sitting balance-Leahy Scale: Fair Sitting balance - Comments: fair/good seated at EOB   Standing balance support: During functional activity;Bilateral upper extremity supported Standing balance-Leahy Scale: Poor Standing balance comment: using RW                            Cognition Arousal/Alertness: Awake/alert Behavior During Therapy: WFL for tasks assessed/performed Overall Cognitive Status: Within Functional Limits for tasks assessed  Exercises General Exercises - Lower Extremity Long Arc Quad: Seated;AROM;Strengthening;Both;15 reps Hip Flexion/Marching: Seated;AROM;Strengthening;Both;15 reps Toe Raises: Seated;AROM;Strengthening;Both;15 reps Heel Raises: Seated;AROM;Strengthening;Both;15 reps    General Comments        Pertinent Vitals/Pain Pain Assessment: No/denies pain    Home Living                      Prior Function             PT Goals (current goals can now be found in the care plan section) Acute Rehab PT Goals Patient Stated Goal: return home after rehab PT Goal Formulation: With patient Time For Goal Achievement: 11/15/20 Potential to Achieve Goals: Good Progress towards PT goals: Progressing toward goals    Frequency    Min 3X/week      PT Plan      Co-evaluation              AM-PAC PT "6 Clicks" Mobility   Outcome Measure  Help needed turning from your back to your side while in a flat bed without using bedrails?: A Lot Help needed moving from lying on your back to sitting on the side of a flat bed without using bedrails?: A Lot Help needed moving to and from a bed to a chair (including a wheelchair)?: A Lot Help needed standing up from a chair using your arms (e.g., wheelchair or bedside chair)?: A Lot Help needed to walk in hospital room?: A Lot Help needed climbing 3-5 steps with a railing? : Total 6 Click Score: 11    End of Session Equipment Utilized During Treatment: Oxygen Activity Tolerance: Patient tolerated treatment well;Patient limited by fatigue Patient left: in chair;with call bell/phone within reach;with chair alarm set Nurse Communication: Mobility status PT Visit Diagnosis: Unsteadiness on feet (R26.81);Other abnormalities of gait and mobility (R26.89);Muscle weakness (generalized) (M62.81)     Time: [id number] PT Time Calculation (min) (ACUTE ONLY): 29 min  Charges:  $Therapeutic Exercise: 8-22 mins $Therapeutic Activity: 8-22 mins                     12:31 PM, 11/11/20 Lonell Grandchild, MPT Physical Therapist with [facility] [phone] office [phone] mobile phone

## 2020-11-11 NOTE — TOC Progression Note (Signed)
Transition of Care [facility]) - Progression Note    Patient Details  Name: Darren Burke MRN: [id number] Date of Birth: 30-Jun-1946  [facility] CM/SW Contact  Salome Arnt, [place] Phone Number: 11/11/2020, 10:29 AM  Clinical Narrative: LCSW updated [facility] on pt. Anticipate d/c tomorrow per MD. Donella Stade will continue to follow.       Expected Discharge Plan: [place] Barriers to Discharge: Continued Medical Work up  Expected Discharge Plan and Services Expected Discharge Plan: [place] In-house Referral: Clinical Social Work Discharge Planning Services: CM Consult Post Acute Care Choice: [place] arrangements for the past 2 months: Apartment                 DME Arranged: Hospital bed,3-N-1,Other see comment (prescription for condom caths) DME Agency: [place]       HH Arranged: RN,PT [place]: [place] Date [facility] Agency Contacted: 11/05/20 Time [place]: [place] Representative spoke with at [place]: [employer] [place] ([place]) Interventions    Readmission Risk Interventions Readmission Risk Prevention Plan 11/07/2020 10/24/2020  Transportation Screening Complete Complete  PCP or Specialist Appt within 3-5 Days Complete -  HRI or [place] Complete Complete  Social Work Consult for [place] Planning/Counseling Complete Complete  Palliative Care Screening Complete Not Applicable  Medication Review [employer] photographer) Complete Complete  Some recent data might be hidden

## 2020-11-11 NOTE — Discharge Summary (Signed)
Physician Discharge Summary  Darren Burke [id number] DOB: Feb 19, 1946 DOA: 10/21/2020  PCP: Rosita Fire, MD  Admit date: 10/21/2020 Discharge date: 11/12/2020  Admitted From: Home Disposition:   SNF  Recommendations for Outpatient Follow-up:  1. Follow up with PCP in 1-2 weeks 2. Please obtain BMP/CBC in one week    Discharge Condition: Stable CODE STATUS: DNR Diet recommendation: Heart Healthy / Carb Modified   Brief/Interim Summary: Darren C Tilleyis a 75 y.o.malewith medical history significantfor longtimetobacco use, COPD(emphysema),GERD,stage IV CKD, chronic constipation, hypertension, paroxysmal atrial flutter who presented to the emergency department from PCP office with shortness of breath and hypoxia. He was noted to have a pulse ox of 80 to 81% on room air. Patient was admitted with acute hypoxemic respiratory failure secondary to Covid pneumonia and is unvaccinated.He was treated with remdesivir, steroids and baricitinib. Overall hypoxemia significantly improved. He developed significant encephalopathy, which may be related to COVID versus hospital delirium. Overall mental status appears to be improving at this time. CT head with no acute findings.He was also noted to be in atrial fibrillation and is currently rate controlled. Hospital course further complicated by development of significant anemia. He has elevated BUN and hemoglobin has drifted down since admission. He was transfused 1 unit of PRBC on 1/10. Concern for upper GI bleed. GI following and consideringEGDif any signs of active bleed  Discharge Diagnoses:   Acute respiratory failure with hypoxia secondary to Covid pneumonia -patient is unvaccinated. He has underlying chronic lung disease and other co-morbidities. He remains HighRisk for adverse outcomeandseverelungparenchymal injury. He and his family initially refused the use of remdesivir;thenon 10/22/2020 accepted to be  treated along with the use of steroids. Patient with good response to the initiation of barcitinib. Currently, he has completed his steroids, remdesivir and baricitinib course. -11/07/20--remains sob--started IV lasix -11/08/20--remains sob-->repeat CXR, check troponins, V/Q scan, -V/Q intermediate, but low clinical suspicion of PE -PCT 0.11 -1/14--restart IV steroids -1/15--increase IV solumedrol to 80 mg IV bid -11/10/20--redose lasix IV 40 and continued daily IV dosing  -1/18 stable saturations x 48-72 hours, increase WOB much improved   Acute encephalopathy-multifactorial. -Family reports that he may have some baseline cognitive deficits and early signs of dementia at baseline. CT head found to be negative. Suspect that worsening mental status was related to acute infectious process/metabolic issues. Limit sedating medications.  -Overall mental status appears to be improved  Atypical Chest pain -partly reproducible -personally reviewed EKG--sinus, nonspecific STT change -personally reviewed CXR--R-base atelectasis, increase interstitial marking -V/Q--indeterminant probability--clinically low suspicion of PE -troponins 10>>19  Bacteremia -contaminant -oneset blood culture positive for micrococcus likely contaminant. Repeat blood cultures negative. Rocephin was discontinued  Acute diastolic CHF -lasix IV--continue, redose x 1 -11/07/20--personally reviewed CXR--increase interstitial markings; bilateral patchy opacities -11/11/20 Echo EF 50-55%, no WMA, normal RV -d/c to SNF with lasix 40 mg po daily  Euthyroid sick syndrome Low TSH. Free T4 is normal. Repeat labs in 3 to 4 weeks as an outpatient  Chronic stage IV kidney disease  -Baseline creatinine approximately 2.2-2.5. -His creatinine is stable overall and will continue following trend. Continue minimizing nephrotoxic agents and avoiding hypotension. Patient has markedly elevated BUN. His FOBT is also  positive. Concern for GI bleeding.  Blood Loss Anemia  -Noted to have FOBT positive and dark stools. BUN is markedly elevated. Aspirin and heparin have been discontinued. GI following and plans on possible EGDif any signs of active bleed -Hgb trending down from 12>>7 during this admission -continue PPI -Doubt hemolysis with  normal bilirubin -multifactorial including CKDchronic disease, numerous blood draws, dilution -one unit PRBC transfused on 11/04/20 -discussed with Dr. Laural Golden 11/06/20 -Hgb remained stable thereafter -outpatient GI followup in 4-6 weeks  COPD with acute exacerbation -secondary to Covid infection treated with IV steroids and bronchodilators as mentioned above. Overall respiratory status hadstabilized -1/14--wheeze and sob-->restart IV steroids and start combivent -1/15--increase IV solumedrol to 80 mg IV bid -d/c to SNF with prednisone taper  Paroxysmal atrial flutter -RVRimproved -He is on carvediloland diltiazem for rate control.Heart rate is currently stableChadsvasc score of 2 for hypertension and age. -Not a candidate for anticoagulation at this time due to concerns for GI bleeding. -start ASA 81 mg after d/c -d/c with diltiazem 30 mg po bid  Chronic constipation  - laxative therapy ordered. Follow response and further improve bowel management as required.  Transaminitis -reactive from current Covid infection. Currently stable. Continue to monitor.  Hypernatremia/hyperkalemia: -Improved after receiving hypotonic fluids and Lokelma. Continue to monitor  Goals of care. -Detailed discussion with the patient's daughter regarding his expected prognosis. Recommended DNR status and she is in agreement. She wishes to continue with current treatments, but if he were to develop cardiac/respiratory arrest, she would not want heroic measures    Discharge Instructions   Allergies as of 11/12/2020      Reactions   Penicillins  Hives, Itching   Has patient had a PCN reaction causing immediate rash, facial/tongue/throat swelling, SOB or lightheadedness with hypotension: no Has patient had a PCN reaction causing severe rash involving mucus membranes or skin necrosis: no Has patient had a PCN reaction that required hospitalization: no Has patient had a PCN reaction occurring within the last 10 years: no If all of the above answers are "NO", then may proceed with Cephalosporin use.      Medication List    STOP taking these medications   azithromycin 500 MG tablet Commonly known as: ZITHROMAX   carvedilol 3.125 MG tablet Commonly known as: COREG   lactulose 10 GM/15ML solution Commonly known as: CHRONULAC   polyethylene glycol 17 g packet Commonly known as: MIRALAX / GLYCOLAX   senna 8.6 MG Tabs tablet Commonly known as: SENOKOT     TAKE these medications   feeding supplement Liqd Take 237 mLs by mouth 3 (three) times daily between meals.   (feeding supplement) PROSource Plus liquid Take 30 mLs by mouth 2 (two) times daily between meals.   Albuterol Sulfate 2.5 MG/0.5ML Nebu Inhale 2.5 mg into the lungs in the morning and at bedtime.   albuterol 108 (90 Base) MCG/ACT inhaler Commonly known as: VENTOLIN HFA Inhale 2 puffs into the lungs every 6 (six) hours as needed for wheezing or shortness of breath.   aspirin 81 MG EC tablet Take 1 tablet (81 mg total) by mouth daily.   diltiazem 30 MG tablet Commonly known as: CARDIZEM Take 1 tablet (30 mg total) by mouth every 12 (twelve) hours.   folic acid 1 MG tablet Commonly known as: FOLVITE Take 1 tablet (1 mg total) by mouth daily.   furosemide 20 MG tablet Commonly known as: LASIX Take 1 tablet (20 mg total) by mouth daily. Start taking on: November 13, 2020   predniSONE 10 MG tablet Commonly known as: DELTASONE Take 7 tablets (70 mg total) by mouth daily with breakfast. And decrease by one tablet daily Start taking on: November 13, 2020 What changed: how much to take   umeclidinium bromide 62.5 MCG/INH Aepb Commonly known as: Incruse Ellipta Inhale  1 puff into the lungs daily.   vitamin B-12 500 MCG tablet Commonly known as: CYANOCOBALAMIN Take 1 tablet (500 mcg total) by mouth daily.            Durable Medical Equipment  (From admission, onward)         Start     Ordered   11/05/20 2051  For home use only DME Bedside commode  Once       Question:  Patient needs a bedside commode to treat with the following condition  Answer:  Generalized weakness   11/05/20 2052   11/05/20 2051  For home use only DME Hospital bed  Once       Question Answer Comment  Length of Need Lifetime   Patient has (list medical condition): copd   The above medical condition requires: Patient requires the ability to reposition frequently   Head must be elevated greater than: 30 degrees   Bed type Semi-electric      11/05/20 2052          Contact information for after-discharge care    [facility] .   Service: Skilled Nursing Contact information: [address] [place] [address] [phone]                 Allergies  Allergen Reactions  . Penicillins Hives and Itching    Has patient had a PCN reaction causing immediate rash, facial/tongue/throat swelling, SOB or lightheadedness with hypotension: no Has patient had a PCN reaction causing severe rash involving mucus membranes or skin necrosis: no Has patient had a PCN reaction that required hospitalization: no Has patient had a PCN reaction occurring within the last 10 years: no If all of the above answers are "NO", then may proceed with Cephalosporin use.    Consultations:  none   Procedures/Studies: CT HEAD WO CONTRAST  Result Date: 10/29/2020 CLINICAL DATA:  Mental status change, unknown cause. Altered mental status, COVID positive. EXAM: CT HEAD WITHOUT CONTRAST TECHNIQUE: Contiguous axial  images were obtained from the base of the skull through the vertex without intravenous contrast. COMPARISON:  No pertinent prior exams available for comparison. FINDINGS: Brain: Mildly motion degraded exam. Cerebral volume is normal for age. There is no acute intracranial hemorrhage. No demarcated cortical infarct. No extra-axial fluid collection. No evidence of intracranial mass. No midline shift. Vascular: No hyperdense vessel. Atherosclerotic calcifications Skull: Normal. Negative for fracture or focal lesion. Sinuses/Orbits: Visualized orbits show no acute finding. Scattered secretions within the left ethmoid air cells. Moderate mucosal thickening within the right maxillary sinus with associated chronic reactive osteitis. There is full-thickness erosion of the anterior right maxillary sinus wall inferiorly (for instance as seen on series 3, image 3)(series 5, image 23). Other: Partially visualized nasoenteric tube. IMPRESSION: Mildly motion degraded exam. No evidence of acute intracranial abnormality. Moderate mucosal thickening within the right maxillary sinus with associated chronic reactive osteitis. There is focal full-thickness erosion of the anterior right maxillary sinus wall inferiorly. Mild left ethmoid sinusitis. Electronically Signed   By: Kellie Simmering DO   On: 10/29/2020 11:43   NM Pulmonary Perfusion  Result Date: 11/08/2020 CLINICAL DATA:  Shortness of breath, hypoxia, COVID-19 positive EXAM: NUCLEAR MEDICINE PERFUSION LUNG SCAN TECHNIQUE: Perfusion images were obtained in multiple projections after intravenous injection of radiopharmaceutical. Ventilation scans intentionally deferred if perfusion scan and chest x-ray adequate for interpretation during COVID 19 epidemic. RADIOPHARMACEUTICALS:  4.4 mCi Tc-46mMAA IV COMPARISON:  Chest x-ray 11/08/2020 FINDINGS: Heterogeneous accumulation of radiotracer likely secondary to underlying COPD. There are 2 left-sided and at least 1 right-sided small  to moderate sized segmental perfusion defects. No large segmental perfusion defect. IMPRESSION: 1. Intermediate probability for pulmonary embolism. 2. COPD. Electronically Signed   By: Davina Poke D.O.   On: 11/08/2020 14:31   DG CHEST PORT 1 VIEW  Result Date: 11/08/2020 CLINICAL DATA:  Shortness of breath, confusion this morning, COVID-19 positive, history atrial fibrillation, hypertension, COPD EXAM: PORTABLE CHEST 1 VIEW COMPARISON:  Portable exam 1037 hours compared to 11/07/2020 FINDINGS: Normal heart size, mediastinal contours, and pulmonary vascularity. Patchy infiltrate identified at RIGHT lung base consistent with pneumonia unchanged. Underlying emphysematous changes and minimal central peribronchial thickening as well as chronic interstitial prominence. No pleural effusion or pneumothorax. Atherosclerotic calcification aorta. Bones demineralized. IMPRESSION: COPD changes with persistent RIGHT basilar infiltrate consistent with pneumonia. Aortic Atherosclerosis (ICD10-I70.0) and Emphysema (ICD10-J43.9). Electronically Signed   By: Lavonia Dana M.D.   On: 11/08/2020 10:50   DG CHEST PORT 1 VIEW  Result Date: 11/07/2020 CLINICAL DATA:  Shortness of breath. The patient tested positive for COVID-19 10/21/2020. EXAM: PORTABLE CHEST 1 VIEW COMPARISON:  Single-view of the chest 10/28/2020, 10/25/2020 one 11/24/2017. PA and lateral chest 12/10/2016. FINDINGS: The lungs are emphysematous. Mild, patchy airspace disease in the right mid and lower lung zones appears mildly improved compared to the most recent study. Heart size is normal. Aortic atherosclerosis. IMPRESSION: Mild improvement in patchy airspace disease in the right lower lung zone compatible with resolving pneumonia. Aortic Atherosclerosis (ICD10-I70.0) and Emphysema (ICD10-J43.9). Electronically Signed   By: Inge Rise M.D.   On: 11/07/2020 12:06   DG CHEST PORT 1 VIEW  Result Date: 10/28/2020 CLINICAL DATA:  Post NG tube placement.  EXAM: PORTABLE CHEST 1 VIEW COMPARISON:  10/25/2020 and prior. FINDINGS: Diffuse interstitial prominence. No pneumothorax or pleural effusion. Decreased conspicuity of patchy right basilar opacities. Stable cardiomediastinal silhouette. Non weighted enteric tube tip and side hole overlie the gastric body. IMPRESSION: Enteric tube tip and side hole overlie the gastric body. Emphysematous changes. Right basilar opacities are less conspicuous than prior exam. Electronically Signed   By: Primitivo Gauze M.D.   On: 10/28/2020 09:38   DG CHEST PORT 1 VIEW  Result Date: 10/25/2020 CLINICAL DATA:  Worsening shortness of breath today. Positive COVID test on 10/21/2020. Former smoker with COPD. EXAM: PORTABLE CHEST 1 VIEW COMPARISON:  10/22/2020 and older studies. FINDINGS: Cardiac silhouette is normal in size. No mediastinal or hilar masses. Bilateral interstitial thickening noted most evident at the bases, right greater than left, similar to the prior exam. No lung consolidation to suggest pneumonia. No pleural effusion or pneumothorax. Skeletal structures are grossly intact. IMPRESSION: 1. No convincing acute cardiopulmonary disease. 2. Bilateral interstitial thickening, greatest at the right lung base, consistent with fibrosis. Electronically Signed   By: Lajean Manes M.D.   On: 10/25/2020 14:01   Portable chest 1 View  Result Date: 10/22/2020 CLINICAL DATA:  Shortness of breath. EXAM: PORTABLE CHEST 1 VIEW COMPARISON:  10/21/2020.  11/24/2017. FINDINGS: Mediastinum hilar structures normal. Cardiomegaly with pulmonary venous congestion bilateral interstitial prominence. Small bilateral pleural effusions. Findings suggest CHF. Pneumonitis cannot be excluded. Associated bibasilar atelectasis and or infiltrates. Bibasilar pneumonia cannot be excluded. IMPRESSION: 1. Cardiomegaly with pulmonary venous congestion bilateral interstitial prominence. Small bilateral pleural effusions. Findings suggest CHF. 2.  Associated bibasilar atelectasis and or infiltrates. Bibasilar pneumonia cannot be excluded. Electronically Signed   By: Marcello Moores  Register   On: 10/22/2020 05:11   DG Chest Portable 1 View  Result Date: 10/21/2020 CLINICAL DATA:  Hypoxia. EXAM: PORTABLE CHEST 1 VIEW COMPARISON:  November 24, 2017. FINDINGS: The heart size and mediastinal contours are within normal limits. No pneumothorax or pleural effusion is noted. Left lung is clear. Right midlung and basilar opacities are noted concerning for pneumonia. The visualized skeletal structures are unremarkable. IMPRESSION: Right midlung and basilar opacities are noted concerning for pneumonia. Electronically Signed   By: Marijo Conception M.D.   On: 10/21/2020 12:59   ECHOCARDIOGRAM COMPLETE  Result Date: 11/11/2020    ECHOCARDIOGRAM REPORT   Patient Name:   Darren Burke Date of Exam: 11/11/2020 Medical Rec #:  [id number]      Height:       67.0 in Accession #:    [id number]     Weight:       214.3 lb Date of Birth:  10/17/1946     BSA:          2.082 m Patient Age:    48 years       BP:           95/66 mmHg Patient Gender: M              HR:           101 bpm. Exam Location:  [facility] Procedure: 2D Echo, Cardiac Doppler and Color Doppler Indications:    Dyspnea R06.00  History:        Patient has prior history of Echocardiogram examinations, most                 recent 12/12/2016. COPD, Arrythmias:Atrial Flutter; Risk                 Factors:Hypertension. Pneumonia due to COVID-19 virus.  Sonographer:    Alvino Chapel RCS Referring Phys: [phone] Joseangel Nettleton IMPRESSIONS  1. Left ventricular ejection fraction, by estimation, is 50 to 55%. The left ventricle has low normal function. The left ventricle has no regional wall motion abnormalities. There is mild left ventricular hypertrophy. Left ventricular diastolic parameters are indeterminate.  2. Right ventricular systolic function is normal. The right ventricular size is mildly enlarged. Tricuspid regurgitation  signal is inadequate for assessing PA pressure.  3. Left atrial size was mildly dilated.  4. The mitral valve is abnormal. Trivial mitral valve regurgitation. Moderate mitral annular calcification.  5. The aortic valve is tricuspid. There is mild calcification of the aortic valve. Aortic valve regurgitation is not visualized. Mild aortic valve sclerosis is present, with no evidence of aortic valve stenosis.  6. The inferior vena cava is normal in size with <50% respiratory variability, suggesting right atrial pressure of 8 mmHg. FINDINGS  Left Ventricle: Left ventricular ejection fraction, by estimation, is 50 to 55%. The left ventricle has low normal function. The left ventricle has no regional wall motion abnormalities. The left ventricular internal cavity size was normal in size. There is mild left ventricular hypertrophy. Left ventricular diastolic parameters are indeterminate. Right Ventricle: The right ventricular size is mildly enlarged. No increase in right ventricular wall thickness. Right ventricular systolic function is normal. Tricuspid regurgitation signal is inadequate for assessing PA pressure. Left Atrium: Left atrial size was mildly dilated. Right Atrium: Right atrial size was normal in size. Pericardium: There is no evidence of pericardial effusion. Mitral Valve: The mitral valve is abnormal. Moderate mitral annular calcification. Trivial mitral valve regurgitation. Tricuspid Valve: The tricuspid valve  is grossly normal. Tricuspid valve regurgitation is trivial. Aortic Valve: The aortic valve is tricuspid. There is mild calcification of the aortic valve. There is mild aortic valve annular calcification. Aortic valve regurgitation is not visualized. Mild aortic valve sclerosis is present, with no evidence of aortic valve stenosis. Pulmonic Valve: The pulmonic valve was not well visualized. Pulmonic valve regurgitation is not visualized. Aorta: The aortic root is normal in size and structure. Venous:  The inferior vena cava is normal in size with less than 50% respiratory variability, suggesting right atrial pressure of 8 mmHg. IAS/Shunts: No atrial level shunt detected by color flow Doppler.  LEFT VENTRICLE PLAX 2D LVIDd:         4.90 cm LVIDs:         3.30 cm LV PW:         1.05 cm LV IVS:        1.10 cm LVOT diam:     2.10 cm LV SV:         67 LV SV Index:   32 LVOT Area:     3.46 cm  RIGHT VENTRICLE TAPSE (M-mode): 2.0 cm LEFT ATRIUM             Index       RIGHT ATRIUM           Index LA diam:        3.90 cm 1.87 cm/m  RA Area:     17.10 cm LA Vol (A2C):   91.0 ml 43.70 ml/m RA Volume:   42.30 ml  20.31 ml/m LA Vol (A4C):   61.4 ml 29.48 ml/m LA Biplane Vol: 79.5 ml 38.18 ml/m  AORTIC VALVE LVOT Vmax:   107.00 cm/s LVOT Vmean:  78.300 cm/s LVOT VTI:    0.192 m  AORTA Ao Root diam: 3.40 cm MITRAL VALVE MV Area (PHT): 3.12 cm     SHUNTS MV Decel Time: 243 msec     Systemic VTI:  0.19 m MV E velocity: 143.00 cm/s  Systemic Diam: 2.10 cm Rozann Lesches MD Electronically signed by Rozann Lesches MD Signature Date/Time: 11/11/2020/3:21:05 PM    Final         Discharge Exam: Vitals:   11/12/20 0843 11/12/20 1000  BP: 93/62 107/75  Pulse: (!) 103 98  Resp:  20  Temp:  98.1 F (36.7 C)  SpO2: 98% 97%   Vitals:   11/12/20 0551 11/12/20 0634 11/12/20 0843 11/12/20 1000  BP: 95/71  93/62 107/75  Pulse: 100  (!) 103 98  Resp: 18   20  Temp: 98.1 F (36.7 C)   98.1 F (36.7 C)  TempSrc: Oral   Oral  SpO2: 98% 95% 98% 97%  Weight:      Height:        General: Pt is alert, awake, not in acute distress Cardiovascular: RRR, S1/S2 +, no rubs, no gallops Respiratory: bibasilar rales. No wheeze Abdominal: Soft, NT, ND, bowel sounds + Extremities: no edema, no cyanosis   The results of significant diagnostics from this hospitalization (including imaging, microbiology, ancillary and laboratory) are listed below for reference.    Significant Diagnostic Studies: CT HEAD WO  CONTRAST  Result Date: 10/29/2020 CLINICAL DATA:  Mental status change, unknown cause. Altered mental status, COVID positive. EXAM: CT HEAD WITHOUT CONTRAST TECHNIQUE: Contiguous axial images were obtained from the base of the skull through the vertex without intravenous contrast. COMPARISON:  No pertinent prior exams available for comparison. FINDINGS: Brain: Mildly motion degraded  exam. Cerebral volume is normal for age. There is no acute intracranial hemorrhage. No demarcated cortical infarct. No extra-axial fluid collection. No evidence of intracranial mass. No midline shift. Vascular: No hyperdense vessel. Atherosclerotic calcifications Skull: Normal. Negative for fracture or focal lesion. Sinuses/Orbits: Visualized orbits show no acute finding. Scattered secretions within the left ethmoid air cells. Moderate mucosal thickening within the right maxillary sinus with associated chronic reactive osteitis. There is full-thickness erosion of the anterior right maxillary sinus wall inferiorly (for instance as seen on series 3, image 3)(series 5, image 23). Other: Partially visualized nasoenteric tube. IMPRESSION: Mildly motion degraded exam. No evidence of acute intracranial abnormality. Moderate mucosal thickening within the right maxillary sinus with associated chronic reactive osteitis. There is focal full-thickness erosion of the anterior right maxillary sinus wall inferiorly. Mild left ethmoid sinusitis. Electronically Signed   By: Kellie Simmering DO   On: 10/29/2020 11:43   NM Pulmonary Perfusion  Result Date: 11/08/2020 CLINICAL DATA:  Shortness of breath, hypoxia, COVID-19 positive EXAM: NUCLEAR MEDICINE PERFUSION LUNG SCAN TECHNIQUE: Perfusion images were obtained in multiple projections after intravenous injection of radiopharmaceutical. Ventilation scans intentionally deferred if perfusion scan and chest x-ray adequate for interpretation during COVID 19 epidemic. RADIOPHARMACEUTICALS:  4.4 mCi Tc-16mMAA  IV COMPARISON:  Chest x-ray 11/08/2020 FINDINGS: Heterogeneous accumulation of radiotracer likely secondary to underlying COPD. There are 2 left-sided and at least 1 right-sided small to moderate sized segmental perfusion defects. No large segmental perfusion defect. IMPRESSION: 1. Intermediate probability for pulmonary embolism. 2. COPD. Electronically Signed   By: NDavina PokeD.O.   On: 11/08/2020 14:31   DG CHEST PORT 1 VIEW  Result Date: 11/08/2020 CLINICAL DATA:  Shortness of breath, confusion this morning, COVID-19 positive, history atrial fibrillation, hypertension, COPD EXAM: PORTABLE CHEST 1 VIEW COMPARISON:  Portable exam 1037 hours compared to 11/07/2020 FINDINGS: Normal heart size, mediastinal contours, and pulmonary vascularity. Patchy infiltrate identified at RIGHT lung base consistent with pneumonia unchanged. Underlying emphysematous changes and minimal central peribronchial thickening as well as chronic interstitial prominence. No pleural effusion or pneumothorax. Atherosclerotic calcification aorta. Bones demineralized. IMPRESSION: COPD changes with persistent RIGHT basilar infiltrate consistent with pneumonia. Aortic Atherosclerosis (ICD10-I70.0) and Emphysema (ICD10-J43.9). Electronically Signed   By: MLavonia DanaM.D.   On: 11/08/2020 10:50   DG CHEST PORT 1 VIEW  Result Date: 11/07/2020 CLINICAL DATA:  Shortness of breath. The patient tested positive for COVID-19 10/21/2020. EXAM: PORTABLE CHEST 1 VIEW COMPARISON:  Single-view of the chest 10/28/2020, 10/25/2020 one 11/24/2017. PA and lateral chest 12/10/2016. FINDINGS: The lungs are emphysematous. Mild, patchy airspace disease in the right mid and lower lung zones appears mildly improved compared to the most recent study. Heart size is normal. Aortic atherosclerosis. IMPRESSION: Mild improvement in patchy airspace disease in the right lower lung zone compatible with resolving pneumonia. Aortic Atherosclerosis (ICD10-I70.0) and  Emphysema (ICD10-J43.9). Electronically Signed   By: TInge RiseM.D.   On: 11/07/2020 12:06   DG CHEST PORT 1 VIEW  Result Date: 10/28/2020 CLINICAL DATA:  Post NG tube placement. EXAM: PORTABLE CHEST 1 VIEW COMPARISON:  10/25/2020 and prior. FINDINGS: Diffuse interstitial prominence. No pneumothorax or pleural effusion. Decreased conspicuity of patchy right basilar opacities. Stable cardiomediastinal silhouette. Non weighted enteric tube tip and side hole overlie the gastric body. IMPRESSION: Enteric tube tip and side hole overlie the gastric body. Emphysematous changes. Right basilar opacities are less conspicuous than prior exam. Electronically Signed   By: CPrimitivo GauzeM.D.   On: 10/28/2020  09:38   DG CHEST PORT 1 VIEW  Result Date: 10/25/2020 CLINICAL DATA:  Worsening shortness of breath today. Positive COVID test on 10/21/2020. Former smoker with COPD. EXAM: PORTABLE CHEST 1 VIEW COMPARISON:  10/22/2020 and older studies. FINDINGS: Cardiac silhouette is normal in size. No mediastinal or hilar masses. Bilateral interstitial thickening noted most evident at the bases, right greater than left, similar to the prior exam. No lung consolidation to suggest pneumonia. No pleural effusion or pneumothorax. Skeletal structures are grossly intact. IMPRESSION: 1. No convincing acute cardiopulmonary disease. 2. Bilateral interstitial thickening, greatest at the right lung base, consistent with fibrosis. Electronically Signed   By: Lajean Manes M.D.   On: 10/25/2020 14:01   Portable chest 1 View  Result Date: 10/22/2020 CLINICAL DATA:  Shortness of breath. EXAM: PORTABLE CHEST 1 VIEW COMPARISON:  10/21/2020.  11/24/2017. FINDINGS: Mediastinum hilar structures normal. Cardiomegaly with pulmonary venous congestion bilateral interstitial prominence. Small bilateral pleural effusions. Findings suggest CHF. Pneumonitis cannot be excluded. Associated bibasilar atelectasis and or infiltrates. Bibasilar  pneumonia cannot be excluded. IMPRESSION: 1. Cardiomegaly with pulmonary venous congestion bilateral interstitial prominence. Small bilateral pleural effusions. Findings suggest CHF. 2. Associated bibasilar atelectasis and or infiltrates. Bibasilar pneumonia cannot be excluded. Electronically Signed   By: Marcello Moores  Register   On: 10/22/2020 05:11   DG Chest Portable 1 View  Result Date: 10/21/2020 CLINICAL DATA:  Hypoxia. EXAM: PORTABLE CHEST 1 VIEW COMPARISON:  November 24, 2017. FINDINGS: The heart size and mediastinal contours are within normal limits. No pneumothorax or pleural effusion is noted. Left lung is clear. Right midlung and basilar opacities are noted concerning for pneumonia. The visualized skeletal structures are unremarkable. IMPRESSION: Right midlung and basilar opacities are noted concerning for pneumonia. Electronically Signed   By: Marijo Conception M.D.   On: 10/21/2020 12:59   ECHOCARDIOGRAM COMPLETE  Result Date: 11/11/2020    ECHOCARDIOGRAM REPORT   Patient Name:   Darren Burke Date of Exam: 11/11/2020 Medical Rec #:  [id number]      Height:       67.0 in Accession #:    [id number]     Weight:       214.3 lb Date of Birth:  Jul 16, 1946     BSA:          2.082 m Patient Age:    60 years       BP:           95/66 mmHg Patient Gender: M              HR:           101 bpm. Exam Location:  [facility] Procedure: 2D Echo, Cardiac Doppler and Color Doppler Indications:    Dyspnea R06.00  History:        Patient has prior history of Echocardiogram examinations, most                 recent 12/12/2016. COPD, Arrythmias:Atrial Flutter; Risk                 Factors:Hypertension. Pneumonia due to COVID-19 virus.  Sonographer:    Alvino Chapel RCS Referring Phys: [phone] Reniyah Gootee IMPRESSIONS  1. Left ventricular ejection fraction, by estimation, is 50 to 55%. The left ventricle has low normal function. The left ventricle has no regional wall motion abnormalities. There is mild left ventricular  hypertrophy. Left ventricular diastolic parameters are indeterminate.  2. Right ventricular systolic function is normal. The right ventricular size  is mildly enlarged. Tricuspid regurgitation signal is inadequate for assessing PA pressure.  3. Left atrial size was mildly dilated.  4. The mitral valve is abnormal. Trivial mitral valve regurgitation. Moderate mitral annular calcification.  5. The aortic valve is tricuspid. There is mild calcification of the aortic valve. Aortic valve regurgitation is not visualized. Mild aortic valve sclerosis is present, with no evidence of aortic valve stenosis.  6. The inferior vena cava is normal in size with <50% respiratory variability, suggesting right atrial pressure of 8 mmHg. FINDINGS  Left Ventricle: Left ventricular ejection fraction, by estimation, is 50 to 55%. The left ventricle has low normal function. The left ventricle has no regional wall motion abnormalities. The left ventricular internal cavity size was normal in size. There is mild left ventricular hypertrophy. Left ventricular diastolic parameters are indeterminate. Right Ventricle: The right ventricular size is mildly enlarged. No increase in right ventricular wall thickness. Right ventricular systolic function is normal. Tricuspid regurgitation signal is inadequate for assessing PA pressure. Left Atrium: Left atrial size was mildly dilated. Right Atrium: Right atrial size was normal in size. Pericardium: There is no evidence of pericardial effusion. Mitral Valve: The mitral valve is abnormal. Moderate mitral annular calcification. Trivial mitral valve regurgitation. Tricuspid Valve: The tricuspid valve is grossly normal. Tricuspid valve regurgitation is trivial. Aortic Valve: The aortic valve is tricuspid. There is mild calcification of the aortic valve. There is mild aortic valve annular calcification. Aortic valve regurgitation is not visualized. Mild aortic valve sclerosis is present, with no evidence of  aortic valve stenosis. Pulmonic Valve: The pulmonic valve was not well visualized. Pulmonic valve regurgitation is not visualized. Aorta: The aortic root is normal in size and structure. Venous: The inferior vena cava is normal in size with less than 50% respiratory variability, suggesting right atrial pressure of 8 mmHg. IAS/Shunts: No atrial level shunt detected by color flow Doppler.  LEFT VENTRICLE PLAX 2D LVIDd:         4.90 cm LVIDs:         3.30 cm LV PW:         1.05 cm LV IVS:        1.10 cm LVOT diam:     2.10 cm LV SV:         67 LV SV Index:   32 LVOT Area:     3.46 cm  RIGHT VENTRICLE TAPSE (M-mode): 2.0 cm LEFT ATRIUM             Index       RIGHT ATRIUM           Index LA diam:        3.90 cm 1.87 cm/m  RA Area:     17.10 cm LA Vol (A2C):   91.0 ml 43.70 ml/m RA Volume:   42.30 ml  20.31 ml/m LA Vol (A4C):   61.4 ml 29.48 ml/m LA Biplane Vol: 79.5 ml 38.18 ml/m  AORTIC VALVE LVOT Vmax:   107.00 cm/s LVOT Vmean:  78.300 cm/s LVOT VTI:    0.192 m  AORTA Ao Root diam: 3.40 cm MITRAL VALVE MV Area (PHT): 3.12 cm     SHUNTS MV Decel Time: 243 msec     Systemic VTI:  0.19 m MV E velocity: 143.00 cm/s  Systemic Diam: 2.10 cm Rozann Lesches MD Electronically signed by Rozann Lesches MD Signature Date/Time: 11/11/2020/3:21:05 PM    Final      Microbiology: No results found for this or any previous  visit (from the past 240 hour(s)).   Labs: Basic Metabolic Panel: Recent Labs  Lab 11/07/20 0631 11/08/20 0437 11/09/20 0654 11/10/20 0932 11/11/20 0410 11/12/20 0449  NA 143 145 142 142 142 139  K 4.0 4.4 4.4 4.6 4.6 4.6  CL 109 107 105 104 101 98  CO2 '28 29 28 28 29 29  '$ GLUCOSE 118* 104* 116* 102* 134* 119*  BUN 61* 57* 64* 82* 87* 97*  CREATININE 2.01* 2.10* 2.08* 2.15* 2.29* 2.33*  CALCIUM 8.1* 8.3* 8.2* 8.6* 8.4* 8.3*  MG 2.2 2.3  --  2.2 2.2 2.1   Liver Function Tests: Recent Labs  Lab 11/06/20 0646 11/07/20 0631 11/10/20 0932 11/11/20 0410 11/12/20 0449  AST '30 29 19  18 17  '$ ALT 87* 79* 57* 52* 51*  ALKPHOS 31* 35* 39 40 37*  BILITOT 0.8 0.9 0.8 1.1 0.5  PROT 5.3* 5.4* 6.1* 6.1* 5.8*  ALBUMIN 2.4* 2.5* 2.6* 2.6* 2.5*   No results for input(s): LIPASE, AMYLASE in the last 168 hours. No results for input(s): AMMONIA in the last 168 hours. CBC: Recent Labs  Lab 11/06/20 0646 11/07/20 0631 11/07/20 1011 11/08/20 1055 11/09/20 0654  WBC 8.3 10.3 11.5* 8.1 8.4  HGB 7.8* 8.2* 8.1* 7.6* 7.7*  HCT 25.7* 27.4* 25.8* 25.2* 25.9*  MCV 100.8* 102.6* 102.0* 103.7* 102.8*  PLT 210 222 228 200 217   Cardiac Enzymes: No results for input(s): CKTOTAL, CKMB, CKMBINDEX, TROPONINI in the last 168 hours. BNP: Invalid input(s): POCBNP CBG: Recent Labs  Lab 11/11/20 1237 11/11/20 2124 11/11/20 2355 11/12/20 0403 11/12/20 0733  GLUCAP 116* 127* 143* 107* 113*    Time coordinating discharge:  36 minutes  Signed:  Orson Eva, DO [facility] Pager: [phone] 11/12/2020, 10:57 AM

## 2020-11-11 NOTE — Progress Notes (Signed)
*  PRELIMINARY RESULTS* Echocardiogram 2D Echocardiogram has been performed with [facility].  Samuel Germany 11/11/2020, 9:43 AM

## 2020-11-12 DIAGNOSIS — J9621 Acute and chronic respiratory failure with hypoxia: Secondary | ICD-10-CM | POA: Diagnosis present

## 2020-11-12 DIAGNOSIS — J440 Chronic obstructive pulmonary disease with acute lower respiratory infection: Secondary | ICD-10-CM | POA: Diagnosis present

## 2020-11-12 DIAGNOSIS — I4891 Unspecified atrial fibrillation: Secondary | ICD-10-CM | POA: Diagnosis not present

## 2020-11-12 DIAGNOSIS — D631 Anemia in chronic kidney disease: Secondary | ICD-10-CM | POA: Diagnosis not present

## 2020-11-12 DIAGNOSIS — Z87891 Personal history of nicotine dependence: Secondary | ICD-10-CM | POA: Diagnosis not present

## 2020-11-12 DIAGNOSIS — R262 Difficulty in walking, not elsewhere classified: Secondary | ICD-10-CM | POA: Diagnosis not present

## 2020-11-12 DIAGNOSIS — R918 Other nonspecific abnormal finding of lung field: Secondary | ICD-10-CM | POA: Diagnosis not present

## 2020-11-12 DIAGNOSIS — R7401 Elevation of levels of liver transaminase levels: Secondary | ICD-10-CM | POA: Diagnosis not present

## 2020-11-12 DIAGNOSIS — B351 Tinea unguium: Secondary | ICD-10-CM | POA: Diagnosis not present

## 2020-11-12 DIAGNOSIS — Z20822 Contact with and (suspected) exposure to covid-19: Secondary | ICD-10-CM | POA: Diagnosis not present

## 2020-11-12 DIAGNOSIS — I5033 Acute on chronic diastolic (congestive) heart failure: Secondary | ICD-10-CM | POA: Diagnosis present

## 2020-11-12 DIAGNOSIS — M2041 Other hammer toe(s) (acquired), right foot: Secondary | ICD-10-CM | POA: Diagnosis not present

## 2020-11-12 DIAGNOSIS — R410 Disorientation, unspecified: Secondary | ICD-10-CM | POA: Diagnosis not present

## 2020-11-12 DIAGNOSIS — M6281 Muscle weakness (generalized): Secondary | ICD-10-CM | POA: Diagnosis not present

## 2020-11-12 DIAGNOSIS — R0600 Dyspnea, unspecified: Secondary | ICD-10-CM | POA: Diagnosis not present

## 2020-11-12 DIAGNOSIS — E8809 Other disorders of plasma-protein metabolism, not elsewhere classified: Secondary | ICD-10-CM | POA: Diagnosis present

## 2020-11-12 DIAGNOSIS — R1312 Dysphagia, oropharyngeal phase: Secondary | ICD-10-CM | POA: Diagnosis not present

## 2020-11-12 DIAGNOSIS — Z7401 Bed confinement status: Secondary | ICD-10-CM | POA: Diagnosis not present

## 2020-11-12 DIAGNOSIS — I10 Essential (primary) hypertension: Secondary | ICD-10-CM | POA: Diagnosis not present

## 2020-11-12 DIAGNOSIS — R5381 Other malaise: Secondary | ICD-10-CM | POA: Diagnosis not present

## 2020-11-12 DIAGNOSIS — L603 Nail dystrophy: Secondary | ICD-10-CM | POA: Diagnosis not present

## 2020-11-12 DIAGNOSIS — F419 Anxiety disorder, unspecified: Secondary | ICD-10-CM | POA: Diagnosis not present

## 2020-11-12 DIAGNOSIS — K265 Chronic or unspecified duodenal ulcer with perforation: Secondary | ICD-10-CM | POA: Diagnosis not present

## 2020-11-12 DIAGNOSIS — J189 Pneumonia, unspecified organism: Secondary | ICD-10-CM | POA: Diagnosis not present

## 2020-11-12 DIAGNOSIS — E870 Hyperosmolality and hypernatremia: Secondary | ICD-10-CM | POA: Diagnosis not present

## 2020-11-12 DIAGNOSIS — E440 Moderate protein-calorie malnutrition: Secondary | ICD-10-CM | POA: Diagnosis not present

## 2020-11-12 DIAGNOSIS — I4892 Unspecified atrial flutter: Secondary | ICD-10-CM | POA: Diagnosis not present

## 2020-11-12 DIAGNOSIS — L89303 Pressure ulcer of unspecified buttock, stage 3: Secondary | ICD-10-CM | POA: Diagnosis present

## 2020-11-12 DIAGNOSIS — I7091 Generalized atherosclerosis: Secondary | ICD-10-CM | POA: Diagnosis not present

## 2020-11-12 DIAGNOSIS — R059 Cough, unspecified: Secondary | ICD-10-CM | POA: Diagnosis not present

## 2020-11-12 DIAGNOSIS — J1281 Pneumonia due to SARS-associated coronavirus: Secondary | ICD-10-CM | POA: Diagnosis not present

## 2020-11-12 DIAGNOSIS — R069 Unspecified abnormalities of breathing: Secondary | ICD-10-CM | POA: Diagnosis not present

## 2020-11-12 DIAGNOSIS — I130 Hypertensive heart and chronic kidney disease with heart failure and stage 1 through stage 4 chronic kidney disease, or unspecified chronic kidney disease: Secondary | ICD-10-CM | POA: Diagnosis present

## 2020-11-12 DIAGNOSIS — L89153 Pressure ulcer of sacral region, stage 3: Secondary | ICD-10-CM | POA: Diagnosis present

## 2020-11-12 DIAGNOSIS — Z880 Allergy status to penicillin: Secondary | ICD-10-CM | POA: Diagnosis not present

## 2020-11-12 DIAGNOSIS — J441 Chronic obstructive pulmonary disease with (acute) exacerbation: Secondary | ICD-10-CM | POA: Diagnosis present

## 2020-11-12 DIAGNOSIS — J984 Other disorders of lung: Secondary | ICD-10-CM | POA: Diagnosis not present

## 2020-11-12 DIAGNOSIS — J9601 Acute respiratory failure with hypoxia: Secondary | ICD-10-CM | POA: Diagnosis not present

## 2020-11-12 DIAGNOSIS — Z7901 Long term (current) use of anticoagulants: Secondary | ICD-10-CM | POA: Diagnosis not present

## 2020-11-12 DIAGNOSIS — N184 Chronic kidney disease, stage 4 (severe): Secondary | ICD-10-CM | POA: Diagnosis present

## 2020-11-12 DIAGNOSIS — R41841 Cognitive communication deficit: Secondary | ICD-10-CM | POA: Diagnosis not present

## 2020-11-12 DIAGNOSIS — E871 Hypo-osmolality and hyponatremia: Secondary | ICD-10-CM | POA: Diagnosis present

## 2020-11-12 DIAGNOSIS — Z7982 Long term (current) use of aspirin: Secondary | ICD-10-CM | POA: Diagnosis not present

## 2020-11-12 DIAGNOSIS — R404 Transient alteration of awareness: Secondary | ICD-10-CM | POA: Diagnosis not present

## 2020-11-12 DIAGNOSIS — Z66 Do not resuscitate: Secondary | ICD-10-CM | POA: Diagnosis present

## 2020-11-12 DIAGNOSIS — E0781 Sick-euthyroid syndrome: Secondary | ICD-10-CM | POA: Diagnosis not present

## 2020-11-12 DIAGNOSIS — E875 Hyperkalemia: Secondary | ICD-10-CM | POA: Diagnosis present

## 2020-11-12 DIAGNOSIS — R0602 Shortness of breath: Secondary | ICD-10-CM | POA: Diagnosis not present

## 2020-11-12 DIAGNOSIS — J1282 Pneumonia due to coronavirus disease 2019: Secondary | ICD-10-CM | POA: Diagnosis not present

## 2020-11-12 DIAGNOSIS — Z8616 Personal history of COVID-19: Secondary | ICD-10-CM | POA: Diagnosis not present

## 2020-11-12 DIAGNOSIS — U071 COVID-19: Secondary | ICD-10-CM | POA: Diagnosis not present

## 2020-11-12 DIAGNOSIS — I5031 Acute diastolic (congestive) heart failure: Secondary | ICD-10-CM | POA: Diagnosis not present

## 2020-11-12 DIAGNOSIS — D649 Anemia, unspecified: Secondary | ICD-10-CM | POA: Diagnosis not present

## 2020-11-12 DIAGNOSIS — G9341 Metabolic encephalopathy: Secondary | ICD-10-CM | POA: Diagnosis not present

## 2020-11-12 DIAGNOSIS — R571 Hypovolemic shock: Secondary | ICD-10-CM | POA: Diagnosis not present

## 2020-11-12 DIAGNOSIS — K5900 Constipation, unspecified: Secondary | ICD-10-CM | POA: Diagnosis not present

## 2020-11-12 DIAGNOSIS — M2042 Other hammer toe(s) (acquired), left foot: Secondary | ICD-10-CM | POA: Diagnosis not present

## 2020-11-12 LAB — COMPREHENSIVE METABOLIC PANEL
ALT: 51 U/L — ABNORMAL HIGH (ref 0–44)
AST: 17 U/L (ref 15–41)
Albumin: 2.5 g/dL — ABNORMAL LOW (ref 3.5–5.0)
Alkaline Phosphatase: 37 U/L — ABNORMAL LOW (ref 38–126)
Anion gap: 12 (ref 5–15)
BUN: 97 mg/dL — ABNORMAL HIGH (ref 8–23)
CO2: 29 mmol/L (ref 22–32)
Calcium: 8.3 mg/dL — ABNORMAL LOW (ref 8.9–10.3)
Chloride: 98 mmol/L (ref 98–111)
Creatinine, Ser: 2.33 mg/dL — ABNORMAL HIGH (ref 0.61–1.24)
GFR, Estimated: 29 mL/min — ABNORMAL LOW (ref >60)
Glucose, Bld: 119 mg/dL — ABNORMAL HIGH (ref 70–99)
Potassium: 4.6 mmol/L (ref 3.5–5.1)
Sodium: 139 mmol/L (ref 135–145)
Total Bilirubin: 0.5 mg/dL (ref 0.3–1.2)
Total Protein: 5.8 g/dL — ABNORMAL LOW (ref 6.5–8.1)

## 2020-11-12 LAB — GLUCOSE, CAPILLARY
Glucose-Capillary: 107 mg/dL — ABNORMAL HIGH (ref 70–99)
Glucose-Capillary: 113 mg/dL — ABNORMAL HIGH (ref 70–99)
Glucose-Capillary: 161 mg/dL — ABNORMAL HIGH (ref 70–99)
Glucose-Capillary: 111 mg/dL — ABNORMAL HIGH (ref 70–99)
Glucose-Capillary: 134 mg/dL — ABNORMAL HIGH (ref 70–99)

## 2020-11-12 LAB — MAGNESIUM: Magnesium: 2.1 mg/dL (ref 1.7–2.4)

## 2020-11-12 MED ORDER — FUROSEMIDE 20 MG PO TABS: 20.0000 mg | ORAL_TABLET | Freq: Every day | ORAL | Status: DC

## 2020-11-12 MED ORDER — PREDNISONE 20 MG PO TABS: 70.0000 mg | ORAL_TABLET | Freq: Every day | ORAL | Status: DC

## 2020-11-12 MED ORDER — DILTIAZEM HCL 30 MG PO TABS
30.0000 mg | ORAL_TABLET | Freq: Two times a day (BID) | ORAL | Status: DC
  Administered 2020-11-12: 30 mg via ORAL
  Filled 2020-11-12: qty 1

## 2020-11-12 MED ORDER — FUROSEMIDE 40 MG PO TABS
40.0000 mg | ORAL_TABLET | Freq: Every day | ORAL | Status: AC
Start: 2020-11-13 — End: ?

## 2020-11-12 MED ORDER — DILTIAZEM HCL 30 MG PO TABS: 30.0000 mg | ORAL_TABLET | Freq: Two times a day (BID) | ORAL | 1 refills | Status: AC

## 2020-11-12 MED ORDER — PREDNISONE 10 MG PO TABS: 70.0000 mg | ORAL_TABLET | Freq: Every day | ORAL | Status: AC

## 2020-11-12 NOTE — Progress Notes (Signed)
Pt discharged via stretcher by [facility] to [place]. Pt's belongings in his possession at time of discharge.

## 2020-11-12 NOTE — TOC Transition Note (Addendum)
Transition of Care [facility]) - CM/SW Discharge Note   Patient Details  Name: Darren Burke MRN: [id number] Date of Birth: September 16, 1946  [facility] CM/SW Contact:  Salome Arnt, [place] Phone Number: 11/12/2020, 11:15 AM   Clinical Narrative:   Pt d/c today to [facility]. Pt's daughters and facility aware and agreeable. Daughters requesting transport via [facility]. Pt will go to room 518-1. TOC to arrange. D/C clinicals sent to facility and RN to call report. RN updated.     Final next level of care: Skilled Nursing Facility Barriers to Discharge: Barriers Resolved   Patient Goals and CMS Choice Patient states their goals for this hospitalization and ongoing recovery are:: Home with [facility] or SNF, will do what PT recommends   Choice offered to / list presented to : NA  Discharge Placement   Existing PASRR number confirmed : 11/12/20          Patient chooses bed at: [facility] Patient to be transferred to facility by: [facility] Name of family member notified: daughters Patient and family notified of of transfer: 11/12/20  Discharge Plan and Services In-house Referral: Clinical Social Work Discharge Planning Services: CM Consult Post Acute Care Choice: Home Health          DME Arranged: Hospital bed,3-N-1,Other see comment (prescription for condom caths) DME Agency: [facility]       HH Arranged: RN,PT [place] Agency: [place] Date [facility] Agency Contacted: 11/05/20 Time [place]: [place] Representative spoke with at [place]: [place] ([place]) Interventions     Readmission Risk Interventions Readmission Risk Prevention Plan 11/07/2020 10/24/2020  Transportation Screening Complete Complete  PCP or Specialist Appt within 3-5 Days Complete -  HRI or Home Care Consult Complete Complete  Social Work Consult for [place] Planning/Counseling Complete Complete  Palliative Care  Screening Complete Not Applicable  Medication Review Press photographer) Complete Complete  Some recent data might be hidden

## 2020-11-12 NOTE — Progress Notes (Signed)
Pt ready for discharge. Pt states that he is not going to the [facility] because he doesn't feel good and his daughter told him he doesn't have to go if he doesn't want to.  MD Tat and SW notified. Called pt's daughter [name] and let pt speak with her as well.

## 2020-11-12 NOTE — Clinical Social Work Note (Addendum)
Medical Necessity form completed. EMS scheduled. Form printed to 300 RN station. RN updated.  Addendum: Patient did not want to go to SNF. CSW spoke with patient's daughter, Awilda Metro, and daughter reported she did not want the patient to lose his bed. Daughter agreeable to patient going to SNF and expressed the patient was likely feeling anxiety about "the unknown."

## 2020-11-12 NOTE — Care Management Important Message (Signed)
Important Message  Patient Details  Name: Darren Burke MRN: [id number] Date of Birth: 1946/10/12   Medicare Important Message Given:  Yes (spoke with daughter [name], RN  Donella Stade was asked to deliver with discharge paperwork.)     [name] 11/12/2020, 12:37 PM

## 2020-11-13 NOTE — Telephone Encounter (Signed)
Forwarded to [employer] to schedule

## 2020-11-14 DIAGNOSIS — I4892 Unspecified atrial flutter: Secondary | ICD-10-CM | POA: Diagnosis not present

## 2020-11-14 DIAGNOSIS — K5900 Constipation, unspecified: Secondary | ICD-10-CM | POA: Diagnosis not present

## 2020-11-14 DIAGNOSIS — E870 Hyperosmolality and hypernatremia: Secondary | ICD-10-CM | POA: Diagnosis not present

## 2020-11-14 DIAGNOSIS — U071 COVID-19: Secondary | ICD-10-CM | POA: Diagnosis not present

## 2020-11-14 DIAGNOSIS — I5031 Acute diastolic (congestive) heart failure: Secondary | ICD-10-CM | POA: Diagnosis not present

## 2020-11-14 DIAGNOSIS — E0781 Sick-euthyroid syndrome: Secondary | ICD-10-CM | POA: Diagnosis not present

## 2020-11-14 DIAGNOSIS — D649 Anemia, unspecified: Secondary | ICD-10-CM | POA: Diagnosis not present

## 2020-11-14 DIAGNOSIS — N184 Chronic kidney disease, stage 4 (severe): Secondary | ICD-10-CM | POA: Diagnosis not present

## 2020-11-28 DIAGNOSIS — I7091 Generalized atherosclerosis: Secondary | ICD-10-CM | POA: Diagnosis not present

## 2020-11-28 DIAGNOSIS — L603 Nail dystrophy: Secondary | ICD-10-CM | POA: Diagnosis not present

## 2020-11-28 DIAGNOSIS — M2042 Other hammer toe(s) (acquired), left foot: Secondary | ICD-10-CM | POA: Diagnosis not present

## 2020-11-28 DIAGNOSIS — M2041 Other hammer toe(s) (acquired), right foot: Secondary | ICD-10-CM | POA: Diagnosis not present

## 2020-11-28 DIAGNOSIS — B351 Tinea unguium: Secondary | ICD-10-CM | POA: Diagnosis not present

## 2020-12-03 DIAGNOSIS — F419 Anxiety disorder, unspecified: Secondary | ICD-10-CM | POA: Diagnosis not present

## 2020-12-03 DIAGNOSIS — K265 Chronic or unspecified duodenal ulcer with perforation: Secondary | ICD-10-CM | POA: Diagnosis not present

## 2020-12-03 DIAGNOSIS — G9341 Metabolic encephalopathy: Secondary | ICD-10-CM | POA: Diagnosis not present

## 2020-12-03 DIAGNOSIS — J189 Pneumonia, unspecified organism: Secondary | ICD-10-CM | POA: Diagnosis not present

## 2020-12-03 DIAGNOSIS — U071 COVID-19: Secondary | ICD-10-CM | POA: Diagnosis not present

## 2020-12-03 DIAGNOSIS — Z20822 Contact with and (suspected) exposure to covid-19: Secondary | ICD-10-CM | POA: Diagnosis not present

## 2020-12-03 DIAGNOSIS — J441 Chronic obstructive pulmonary disease with (acute) exacerbation: Secondary | ICD-10-CM | POA: Diagnosis not present

## 2020-12-03 DIAGNOSIS — I4891 Unspecified atrial fibrillation: Secondary | ICD-10-CM | POA: Diagnosis not present

## 2020-12-03 DIAGNOSIS — L89153 Pressure ulcer of sacral region, stage 3: Secondary | ICD-10-CM | POA: Diagnosis not present

## 2020-12-03 DIAGNOSIS — J9621 Acute and chronic respiratory failure with hypoxia: Secondary | ICD-10-CM | POA: Diagnosis not present

## 2020-12-03 DIAGNOSIS — R918 Other nonspecific abnormal finding of lung field: Secondary | ICD-10-CM | POA: Diagnosis not present

## 2020-12-03 DIAGNOSIS — I5033 Acute on chronic diastolic (congestive) heart failure: Secondary | ICD-10-CM | POA: Diagnosis not present

## 2020-12-04 ENCOUNTER — Encounter (INDEPENDENT_AMBULATORY_CARE_PROVIDER_SITE_OTHER): Payer: Self-pay | Admitting: Gastroenterology

## 2020-12-04 ENCOUNTER — Ambulatory Visit (INDEPENDENT_AMBULATORY_CARE_PROVIDER_SITE_OTHER): Payer: Medicare Other | Admitting: Gastroenterology

## 2020-12-04 ENCOUNTER — Telehealth (INDEPENDENT_AMBULATORY_CARE_PROVIDER_SITE_OTHER): Payer: Self-pay

## 2020-12-04 DIAGNOSIS — N289 Disorder of kidney and ureter, unspecified: Secondary | ICD-10-CM | POA: Diagnosis not present

## 2020-12-04 DIAGNOSIS — I4891 Unspecified atrial fibrillation: Secondary | ICD-10-CM | POA: Diagnosis present

## 2020-12-04 DIAGNOSIS — J9601 Acute respiratory failure with hypoxia: Secondary | ICD-10-CM | POA: Diagnosis not present

## 2020-12-04 DIAGNOSIS — E875 Hyperkalemia: Secondary | ICD-10-CM | POA: Diagnosis present

## 2020-12-04 DIAGNOSIS — K265 Chronic or unspecified duodenal ulcer with perforation: Secondary | ICD-10-CM | POA: Diagnosis not present

## 2020-12-04 DIAGNOSIS — I10 Essential (primary) hypertension: Secondary | ICD-10-CM | POA: Diagnosis not present

## 2020-12-04 DIAGNOSIS — R0902 Hypoxemia: Secondary | ICD-10-CM | POA: Diagnosis not present

## 2020-12-04 DIAGNOSIS — J9621 Acute and chronic respiratory failure with hypoxia: Secondary | ICD-10-CM | POA: Diagnosis present

## 2020-12-04 DIAGNOSIS — I5033 Acute on chronic diastolic (congestive) heart failure: Secondary | ICD-10-CM | POA: Diagnosis present

## 2020-12-04 DIAGNOSIS — R918 Other nonspecific abnormal finding of lung field: Secondary | ICD-10-CM | POA: Diagnosis not present

## 2020-12-04 DIAGNOSIS — J441 Chronic obstructive pulmonary disease with (acute) exacerbation: Secondary | ICD-10-CM | POA: Diagnosis present

## 2020-12-04 DIAGNOSIS — K275 Chronic or unspecified peptic ulcer, site unspecified, with perforation: Secondary | ICD-10-CM | POA: Diagnosis not present

## 2020-12-04 DIAGNOSIS — K5900 Constipation, unspecified: Secondary | ICD-10-CM | POA: Diagnosis not present

## 2020-12-04 DIAGNOSIS — R7989 Other specified abnormal findings of blood chemistry: Secondary | ICD-10-CM | POA: Diagnosis not present

## 2020-12-04 DIAGNOSIS — I509 Heart failure, unspecified: Secondary | ICD-10-CM | POA: Diagnosis not present

## 2020-12-04 DIAGNOSIS — Z452 Encounter for adjustment and management of vascular access device: Secondary | ICD-10-CM | POA: Diagnosis not present

## 2020-12-04 DIAGNOSIS — I959 Hypotension, unspecified: Secondary | ICD-10-CM | POA: Diagnosis not present

## 2020-12-04 DIAGNOSIS — J90 Pleural effusion, not elsewhere classified: Secondary | ICD-10-CM | POA: Diagnosis not present

## 2020-12-04 DIAGNOSIS — R571 Hypovolemic shock: Secondary | ICD-10-CM | POA: Diagnosis not present

## 2020-12-04 DIAGNOSIS — A048 Other specified bacterial intestinal infections: Secondary | ICD-10-CM | POA: Diagnosis not present

## 2020-12-04 DIAGNOSIS — K255 Chronic or unspecified gastric ulcer with perforation: Secondary | ICD-10-CM | POA: Diagnosis not present

## 2020-12-04 DIAGNOSIS — J3489 Other specified disorders of nose and nasal sinuses: Secondary | ICD-10-CM | POA: Diagnosis not present

## 2020-12-04 DIAGNOSIS — J440 Chronic obstructive pulmonary disease with acute lower respiratory infection: Secondary | ICD-10-CM | POA: Diagnosis present

## 2020-12-04 DIAGNOSIS — U071 COVID-19: Secondary | ICD-10-CM | POA: Diagnosis not present

## 2020-12-04 DIAGNOSIS — J189 Pneumonia, unspecified organism: Secondary | ICD-10-CM | POA: Diagnosis present

## 2020-12-04 DIAGNOSIS — R188 Other ascites: Secondary | ICD-10-CM | POA: Diagnosis not present

## 2020-12-04 DIAGNOSIS — L89303 Pressure ulcer of unspecified buttock, stage 3: Secondary | ICD-10-CM | POA: Diagnosis present

## 2020-12-04 DIAGNOSIS — R4020 Unspecified coma: Secondary | ICD-10-CM | POA: Diagnosis not present

## 2020-12-04 DIAGNOSIS — N189 Chronic kidney disease, unspecified: Secondary | ICD-10-CM | POA: Diagnosis not present

## 2020-12-04 DIAGNOSIS — J320 Chronic maxillary sinusitis: Secondary | ICD-10-CM | POA: Diagnosis not present

## 2020-12-04 DIAGNOSIS — N184 Chronic kidney disease, stage 4 (severe): Secondary | ICD-10-CM | POA: Diagnosis present

## 2020-12-04 DIAGNOSIS — J9811 Atelectasis: Secondary | ICD-10-CM | POA: Diagnosis not present

## 2020-12-04 DIAGNOSIS — E8809 Other disorders of plasma-protein metabolism, not elsewhere classified: Secondary | ICD-10-CM | POA: Diagnosis present

## 2020-12-04 DIAGNOSIS — I130 Hypertensive heart and chronic kidney disease with heart failure and stage 1 through stage 4 chronic kidney disease, or unspecified chronic kidney disease: Secondary | ICD-10-CM | POA: Diagnosis present

## 2020-12-04 DIAGNOSIS — F419 Anxiety disorder, unspecified: Secondary | ICD-10-CM | POA: Diagnosis present

## 2020-12-04 DIAGNOSIS — Z9889 Other specified postprocedural states: Secondary | ICD-10-CM | POA: Diagnosis not present

## 2020-12-04 DIAGNOSIS — Z7982 Long term (current) use of aspirin: Secondary | ICD-10-CM | POA: Diagnosis not present

## 2020-12-04 DIAGNOSIS — K271 Acute peptic ulcer, site unspecified, with perforation: Secondary | ICD-10-CM | POA: Diagnosis not present

## 2020-12-04 DIAGNOSIS — D631 Anemia in chronic kidney disease: Secondary | ICD-10-CM | POA: Diagnosis not present

## 2020-12-04 DIAGNOSIS — G319 Degenerative disease of nervous system, unspecified: Secondary | ICD-10-CM | POA: Diagnosis not present

## 2020-12-04 DIAGNOSIS — K5730 Diverticulosis of large intestine without perforation or abscess without bleeding: Secondary | ICD-10-CM | POA: Diagnosis not present

## 2020-12-04 DIAGNOSIS — R778 Other specified abnormalities of plasma proteins: Secondary | ICD-10-CM | POA: Diagnosis not present

## 2020-12-04 DIAGNOSIS — J9690 Respiratory failure, unspecified, unspecified whether with hypoxia or hypercapnia: Secondary | ICD-10-CM | POA: Diagnosis not present

## 2020-12-04 DIAGNOSIS — Z20822 Contact with and (suspected) exposure to covid-19: Secondary | ICD-10-CM | POA: Diagnosis present

## 2020-12-04 DIAGNOSIS — J449 Chronic obstructive pulmonary disease, unspecified: Secondary | ICD-10-CM | POA: Diagnosis not present

## 2020-12-04 DIAGNOSIS — Z87891 Personal history of nicotine dependence: Secondary | ICD-10-CM | POA: Diagnosis not present

## 2020-12-04 DIAGNOSIS — Z880 Allergy status to penicillin: Secondary | ICD-10-CM | POA: Diagnosis not present

## 2020-12-04 DIAGNOSIS — G9341 Metabolic encephalopathy: Secondary | ICD-10-CM | POA: Diagnosis present

## 2020-12-04 DIAGNOSIS — I700 Atherosclerosis of aorta: Secondary | ICD-10-CM | POA: Diagnosis not present

## 2020-12-04 DIAGNOSIS — L89153 Pressure ulcer of sacral region, stage 3: Secondary | ICD-10-CM | POA: Diagnosis present

## 2020-12-04 DIAGNOSIS — R4182 Altered mental status, unspecified: Secondary | ICD-10-CM | POA: Diagnosis not present

## 2020-12-04 DIAGNOSIS — Z9911 Dependence on respirator [ventilator] status: Secondary | ICD-10-CM | POA: Diagnosis not present

## 2020-12-04 DIAGNOSIS — D649 Anemia, unspecified: Secondary | ICD-10-CM | POA: Diagnosis present

## 2020-12-04 DIAGNOSIS — Z9119 Patient's noncompliance with other medical treatment and regimen: Secondary | ICD-10-CM | POA: Diagnosis not present

## 2020-12-04 DIAGNOSIS — D72829 Elevated white blood cell count, unspecified: Secondary | ICD-10-CM | POA: Diagnosis not present

## 2020-12-04 DIAGNOSIS — Z4682 Encounter for fitting and adjustment of non-vascular catheter: Secondary | ICD-10-CM | POA: Diagnosis not present

## 2020-12-04 DIAGNOSIS — Z66 Do not resuscitate: Secondary | ICD-10-CM | POA: Diagnosis present

## 2020-12-04 DIAGNOSIS — Z7901 Long term (current) use of anticoagulants: Secondary | ICD-10-CM | POA: Diagnosis not present

## 2020-12-04 DIAGNOSIS — E871 Hypo-osmolality and hyponatremia: Secondary | ICD-10-CM | POA: Diagnosis present

## 2020-12-04 NOTE — Telephone Encounter (Signed)
Noted  

## 2020-12-04 NOTE — Telephone Encounter (Signed)
Patient no showed for consultation with Dr. Jenetta Downer for Anemia.

## 2020-12-09 DIAGNOSIS — K265 Chronic or unspecified duodenal ulcer with perforation: Secondary | ICD-10-CM | POA: Diagnosis not present

## 2020-12-09 DIAGNOSIS — J441 Chronic obstructive pulmonary disease with (acute) exacerbation: Secondary | ICD-10-CM | POA: Diagnosis not present

## 2020-12-24 DEATH — deceased

## 2022-04-18 IMAGING — DX DG CHEST 1V PORT
1 series · 1 of 1 positions shown · non-contrast
Comparison: 10/21/2020.  11/24/2017.

CLINICAL DATA: Shortness of breath.

EXAM:
PORTABLE CHEST 1 VIEW

[chest ap]
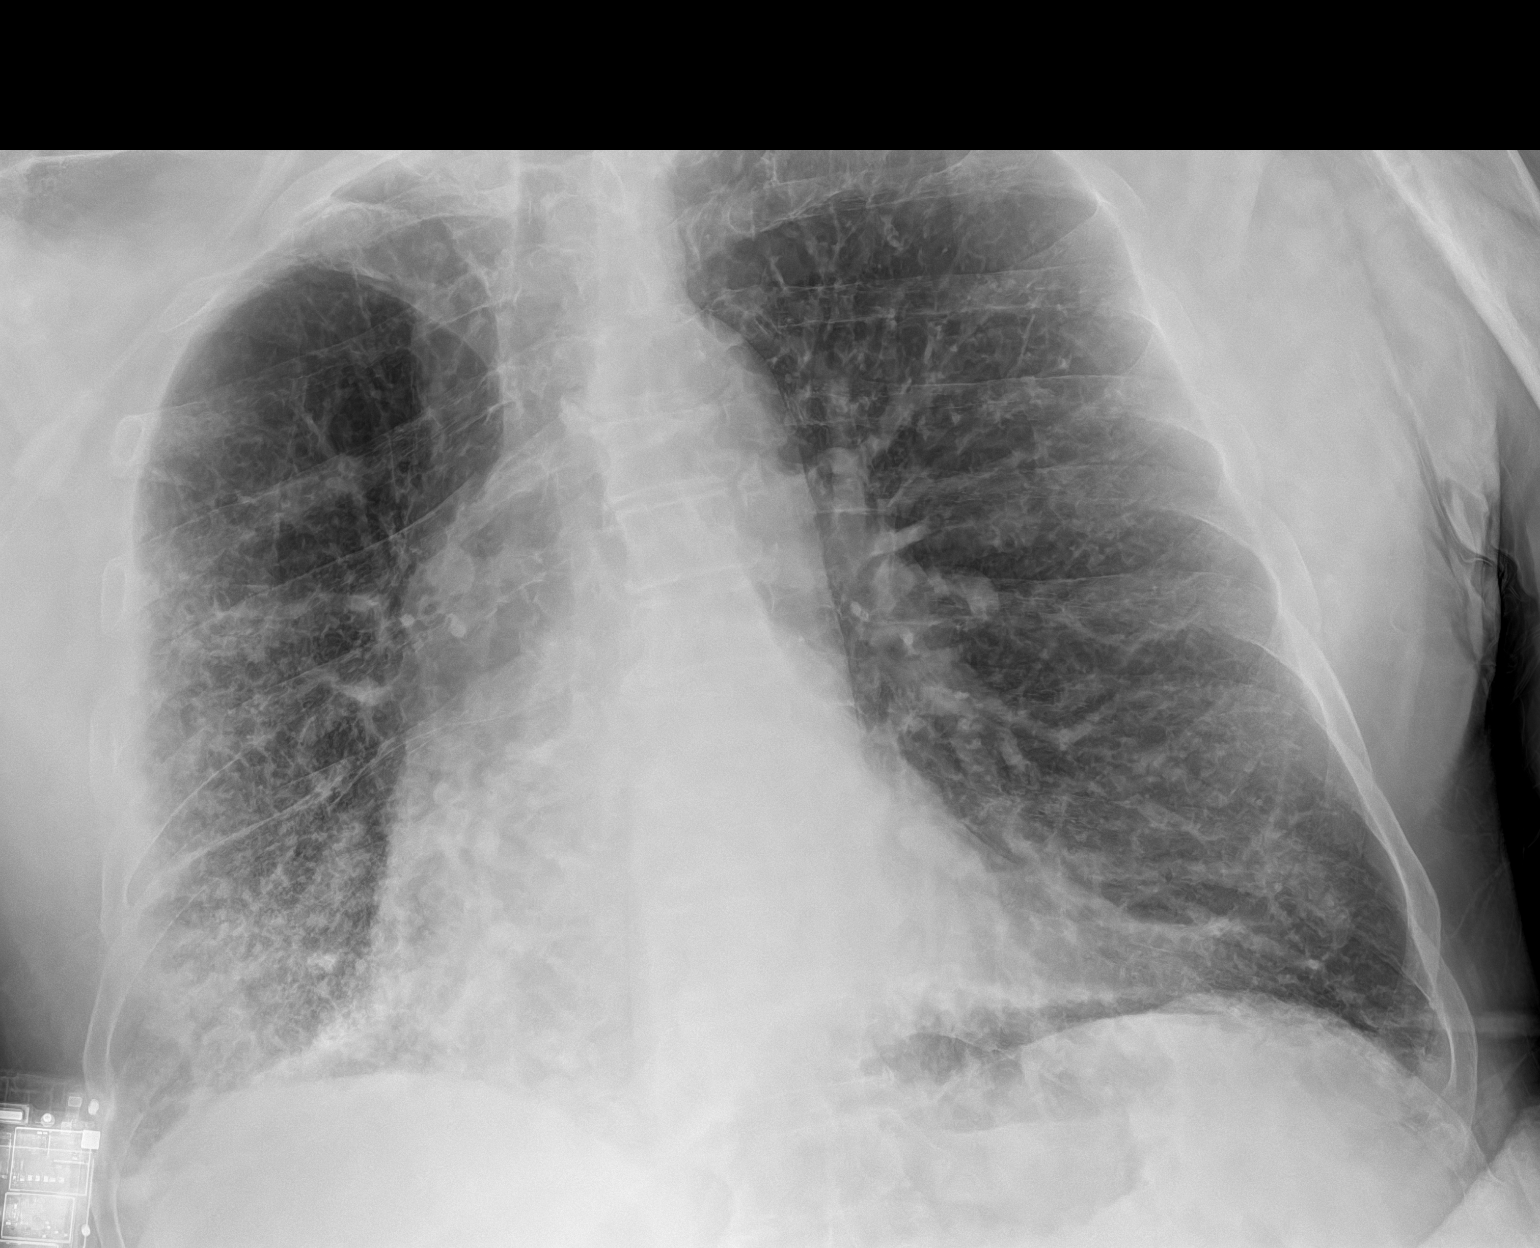

[1 of 1 positions shown; findings below may reference images not displayed]

FINDINGS: Mediastinum hilar structures normal. Cardiomegaly with pulmonary
venous congestion bilateral interstitial prominence. Small bilateral
pleural effusions. Findings suggest CHF. Pneumonitis cannot be
excluded. Associated bibasilar atelectasis and or infiltrates.
Bibasilar pneumonia cannot be excluded.
IMPRESSION: 1. Cardiomegaly with pulmonary venous congestion bilateral
interstitial prominence. Small bilateral pleural effusions. Findings
suggest CHF.

2. Associated bibasilar atelectasis and or infiltrates. Bibasilar
pneumonia cannot be excluded.
